# Patient Record
Sex: Female | Born: 1950 | Race: White | Hispanic: No | Marital: Married | Smoking: Former smoker
Health system: Southern US, Community
[De-identification: ages and names within clinical notes are randomized; demographics above are authoritative.]

## PROBLEM LIST (undated history)

## (undated) DIAGNOSIS — J189 Pneumonia, unspecified organism: Secondary | ICD-10-CM

## (undated) DIAGNOSIS — I10 Essential (primary) hypertension: Secondary | ICD-10-CM

## (undated) DIAGNOSIS — E162 Hypoglycemia, unspecified: Secondary | ICD-10-CM

## (undated) DIAGNOSIS — R519 Headache, unspecified: Secondary | ICD-10-CM

## (undated) DIAGNOSIS — K220 Achalasia of cardia: Secondary | ICD-10-CM

## (undated) DIAGNOSIS — Z8614 Personal history of Methicillin resistant Staphylococcus aureus infection: Secondary | ICD-10-CM

## (undated) DIAGNOSIS — N189 Chronic kidney disease, unspecified: Secondary | ICD-10-CM

## (undated) DIAGNOSIS — I209 Angina pectoris, unspecified: Secondary | ICD-10-CM

## (undated) DIAGNOSIS — M4726 Other spondylosis with radiculopathy, lumbar region: Secondary | ICD-10-CM

## (undated) DIAGNOSIS — D649 Anemia, unspecified: Secondary | ICD-10-CM

## (undated) DIAGNOSIS — E7800 Pure hypercholesterolemia, unspecified: Secondary | ICD-10-CM

## (undated) DIAGNOSIS — M5126 Other intervertebral disc displacement, lumbar region: Secondary | ICD-10-CM

## (undated) DIAGNOSIS — R51 Headache: Secondary | ICD-10-CM

## (undated) DIAGNOSIS — N814 Uterovaginal prolapse, unspecified: Secondary | ICD-10-CM

## (undated) HISTORY — PX: OTHER SURGICAL HISTORY: SHX169

## (undated) HISTORY — PX: TONSILLECTOMY: SUR1361

## (undated) HISTORY — PX: DILATION AND CURETTAGE OF UTERUS: SHX78

## (undated) HISTORY — PX: JOINT REPLACEMENT: SHX530

---
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
## 1998-11-06 ENCOUNTER — Other Ambulatory Visit: Admission: RE | Admit: 1998-11-06 | Discharge: 1998-11-06 | Payer: Self-pay | Admitting: Podiatry

## 1999-08-27 ENCOUNTER — Ambulatory Visit (HOSPITAL_COMMUNITY): Admission: RE | Admit: 1999-08-27 | Discharge: 1999-08-27 | Payer: Self-pay | Admitting: *Deleted

## 1999-08-27 ENCOUNTER — Encounter: Payer: Self-pay | Admitting: *Deleted

## 2001-12-22 ENCOUNTER — Ambulatory Visit (HOSPITAL_COMMUNITY): Admission: RE | Admit: 2001-12-22 | Discharge: 2001-12-23 | Payer: Self-pay | Admitting: Neurosurgery

## 2001-12-22 ENCOUNTER — Encounter: Payer: Self-pay | Admitting: Neurosurgery

## 2003-03-17 ENCOUNTER — Ambulatory Visit (HOSPITAL_COMMUNITY): Admission: RE | Admit: 2003-03-17 | Discharge: 2003-03-17 | Payer: Self-pay | Admitting: Family Medicine

## 2003-03-29 ENCOUNTER — Ambulatory Visit (HOSPITAL_COMMUNITY): Admission: RE | Admit: 2003-03-29 | Discharge: 2003-03-30 | Payer: Self-pay | Admitting: Neurosurgery

## 2007-03-08 ENCOUNTER — Ambulatory Visit (HOSPITAL_COMMUNITY): Admission: RE | Admit: 2007-03-08 | Discharge: 2007-03-08 | Payer: Self-pay | Admitting: Orthopedic Surgery

## 2007-03-14 ENCOUNTER — Inpatient Hospital Stay (HOSPITAL_COMMUNITY): Admission: RE | Admit: 2007-03-14 | Discharge: 2007-03-17 | Payer: Self-pay | Admitting: Orthopedic Surgery

## 2007-06-02 ENCOUNTER — Encounter: Admission: RE | Admit: 2007-06-02 | Discharge: 2007-06-02 | Payer: Self-pay | Admitting: Orthopedic Surgery

## 2008-09-12 ENCOUNTER — Ambulatory Visit: Payer: Self-pay | Admitting: Family Medicine

## 2010-09-08 NOTE — H&P (Signed)
Kendra Mcgrath, Kendra Mcgrath               ACCOUNT NO.:  [id number]   MEDICAL RECORD NO.:  [id number]          PATIENT TYPE:  INP   LOCATION:  NA                           FACILITY:  [facility]   PHYSICIAN:  Madlyn Frankel. [name], M.D.  DATE OF BIRTH:  December 05, 1950   DATE OF ADMISSION:  03/14/2007  DATE OF DISCHARGE:                              HISTORY & PHYSICAL   PROCEDURE:  Conversion left total hip arthroplasty.   CHIEF COMPLAINT:  Left hip pain.   HISTORY OF PRESENT ILLNESS:  A 60 year old female with a history of left  femur fracture with an open reduction internal fixation with subsequent  nonunion.  Pain has been intractable.  Presurgically assessed by [facility].   PAST SURGICAL HISTORY:  Back surgery on L3, L4 and L5 in 2005 and 2006.   FAMILY HISTORY:  Cancer and arthritis.   SOCIAL HISTORY:  Married.  Primary caregiver will be spouse after  surgery.   ALLERGIES:  NO KNOWN DRUG ALLERGIES.   MEDICATIONS:  Oxycodone 5 mg one to three p.o. q.4-6 h. p.r.n. pain.   REVIEW OF SYSTEMS:  PULMONARY:  Pneumonia in August 2008.  ENDOCRINE:  Hypoglycemic events, diet-controlled.  Otherwise, see HPI.   PHYSICAL EXAMINATION:  VITAL SIGNS:  Pulse 64, respirations 18, blood  pressure 138/84.  GENERAL:  She is awake, alert and oriented, well-developed, well-  nourished, in no acute distress.  NECK:  Supple.  No carotid bruits.  CHEST/LUNGS:  Clear to auscultation bilaterally.  BREASTS:  Deferred.  HEART:  Regular rate and rhythm without gallops, clicks, rubs or  murmurs.  ABDOMEN:  Soft, nontender, nondistended.  Bowel sounds present.  GENITOURINARY:  Deferred.  EXTREMITIES:  Left lower extremity has increased pain with internal  range of motion.  SKIN:  Cellulitis with wound incision.  NEUROLOGIC:  Intact distal sensibilities.   LABORATORY DATA AND X-RAY FINDINGS:  Labs, EKG and chest x-ray pending.   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   PLAN:   Conversion left total hip arthroplasty at [facility] by  surgeon Dr. Durene Romans.  Risks and complications were discussed.  Postop medications including Lovenox, Robaxin, iron, aspirin, Colace,  MiraLax provided at time of history and physical.  Postoperative pain  medications will be provided at time of surgery.     ______________________________  Yetta Glassman [address], [place]      Madlyn Frankel. [name], M.D.  Electronically Signed    BLM/MEDQ  D:  03/13/2007  T:  03/14/2007  Job:  [id number]

## 2010-09-08 NOTE — Op Note (Signed)
Kendra Mcgrath, Kendra Mcgrath               ACCOUNT NO.:  [id number]   MEDICAL RECORD NO.:  [id number]          PATIENT TYPE:  INP   LOCATION:  1615                         FACILITY:  [facility]   PHYSICIAN:  Madlyn Frankel. [name], M.D.  DATE OF BIRTH:  02/19/1951   DATE OF PROCEDURE:  03/14/2007  DATE OF DISCHARGE:                               OPERATIVE REPORT   PREOPERATIVE DIAGNOSIS:  Failed left hip surgery with history of  subcapital femoral neck fracture, fixed with a two-hole DHS.   POSTOPERATIVE DIAGNOSIS:  Failed left hip surgery with history of  subcapital femoral neck fracture, fixed with a two-hole DHS.   OPERATION PERFORMED:  Conversion of previous left hip surgery to left  total hip replacement.   SURGEON:  Madlyn Frankel. [name], M.D.   COMPONENTS USED:  1. DePuy hip system with size 50 Pinnacle cup, a size 6 high offset      tri-lock stem with a 36 ceramic ball and a neutral metal liner.  2. Part of this procedure was to remove hardware including DHS screw      and two screw side plate.   ASSISTANT:  Madlyn Frankel. [name], M.D.   ASSISTANT:  Yetta Glassman. Mann, PA   ANESTHESIA:  General.   ESTIMATED BLOOD LOSS:  400 mL.   DRAINS:  None.   COMPLICATIONS:  None.   INDICATIONS FOR PROCEDURE:  Ms. Boak is a 60 year old female with a  history of six weeks ago having a fall in [place].  She sustained a  subcapital femoral neck fracture that was fixed with a two-hole side  plate DHS.  She was seen in follow up in my office approximately two  weeks postoperatively with persistent discomfort and inability to bear  weight.  Radiograph remained  inconclusive for any evidence of stability  and/or any obvious evidence of failure.  For another month she had  persistent pain.  After reviewing her options she wished to proceed with  hip replacement surgery due to the pain and the inability socially to  wait and she needs to return to normal existence.  Risks and benefits of  the surgery were  discussed including infection, DVT, component failure,  dislocation, need for revision surgery, fracture, removal of hardware.  Consent was obtained.   DESCRIPTION OF PROCEDURE:  Patient was brought to operative theater.  Once adequate anesthesia, preoperative antibiotics administered, patient  was positioned in right lateral decubitus position, left side up, left  hip was prescrubbed and prepped and draped in sterile fashion.  The  incision was made utilizing  the old incision, then carrying it  posterior to the trochanter for posterior approach to the hip.  The  iliotibial band and gluteus fascia was  developed.   I then incised the iliotibial band and gluteus fascia identifying the  vastus lateralis.  The vastus lateralis was opened posteriorly and  elevated anteriorly.  The screw and hole on the side plate were  identified.  At this point the two 4.5 screws were removed as was the  lag screw and  locking screw.  I then was able to identify  the locking  type DHS plate, I was able to back it out a little bit and twist the  screw out and utilized and the plate came out without issue.  I  irrigated out this lateral wound, debrided it of any soft tissue,  reapproximated vastus lateralis over to itself with a running 0 Vicryl.  The iliotibial band at this point was reapproximated partway.  Posterior  approach to the hip was now carried out splitting the gluteus  musculature.  The short external rotators were identified and taken down  separate from the posterior capsule.  An L capsulotomy was made  preserving the posterior capsule for later anatomical repair as well as  protection against the sciatic nerve.  The hip fracture was identified  with internal rotation was noted to have a nonunion.  I internally  rotated the hip and held it in position.  I then made a neck osteotomy  based on anatomic landmarks.  From an anatomic standpoint she was noted  to have a fairly steep femoral neck  with the head center above the  trochanter.   Following this neck cut, identified a fragment of the head.  I removed  the fragment in addition to removing the head itself in a corkscrew  I  identified further cartilaginous fragments, fairly large inside the  acetabulum, indicating the reason for persistent pain.   Attention was first directed to the femur.  I used a starting drill,  followed by the canal finder and then irrigated out the canal.  I began  broaching with size 1 initially carried it up to size 4 which sized the  level of my neck cut. I  did finish off  and planed the  cut out with  calcar planer.   Acetabular exposure was obtained and noted to have fairly shallow  acetabulum.  I reamed down to the medial wall and then reamed up to 49  reamer.  I then impacted a 50 at 35 to 40 degrees of abduction and 20  degrees of forward flexion.  Two cancellous screws were used  to support  this  fixation.  A neutral 36 metal trial liner was placed.   At this point I did trial reduction with a size 4 stem in place even  with high offset and this was too short.  There was no stability.  I  then went up to a size 6.  I did trial with a size 5 high offset but  than also felt that the leg lengths were more appropriate with  __________  ball, therefore I utilized the size 6 stem.  A size 6 broach  was impacted.  This sat about probably 5 mm proud of my neck cut.  I  then trialed again with a high offset neck.  I put a +5 ball.  At this  point I felt that hip stability was great, extremely stable with hip  flexion and internal rotation to 80 to 90 degrees, stable in sleep  position, 1 mm shuck in external rotation and extension.  No evidence of  impingement.  Given this, all the components were removed and the trial  components were removed.  Final manhole cover was placed on acetabulum,  final 36 metal liner impacted.  Final 6 high offset trial stem was  impacted.  The level of the  bridge, given this I chose a 36 + 5 high  offset ceramic ball and impacted it on a clean and dry trunnion.  The  hip was reduced.  Hip was irrigated throughout the case and again at  this point. I  reapproximated posterior capsule using #1 Ethibond.  Remaining wound was closed in layers to reapproximate the iliotibial  band with #1 Vicryl and the gluteus fascia with #1 Vicryl running.  The  remaining wound was closed with 4-0 Vicryl and running 4-0 Monocryl for  the entire span of the wound. The  wound was cleaned, dried and dressed  sterilely with Steri-Strips and Mepilex dressing.  She was brought to  the recovery room extubated in stable condition.      Madlyn Frankel [name], M.D.  Electronically Signed    MDO/MEDQ  D:  03/14/2007  T:  03/15/2007  Job:  [id number]

## 2010-09-11 NOTE — Op Note (Signed)
[name], CAKE                           ACCOUNT NO.:  [id number]   MEDICAL RECORD NO.:  [id number]                   PATIENT TYPE:  OIB   LOCATION:  NA                                   FACILITY:  [facility]   PHYSICIAN:  Kyle L. [name], M.D.               DATE OF BIRTH:  1951/01/19   DATE OF PROCEDURE:  12/22/2001  DATE OF DISCHARGE:                                 OPERATIVE REPORT   PREOPERATIVE DIAGNOSES:  1. Displaced disk, L4-L5, right.  2. Right L5 and right L4 radiculopathy.  3. Degenerative disk disease, L4-L5.   POSTOPERATIVE DIAGNOSES:  1. Displaced disk, L4-L5, right.  2. Right L5 and right L4 radiculopathy.  3. Degenerative disk disease, L4-L5.   PROCEDURE:  Right L4 semi-hemilaminectomy with diskectomy and  microdissection.   COMPLICATIONS:  None.   ANESTHESIA:  General endotracheal.   SURGEON:  Kyle L. [name], M.D.   ASSISTANT:  Hewitt Shorts, M.D.   INDICATIONS:  Kendra Mcgrath is a 60 year old woman who presents with  weakness in the right lower right lower extremity at the quadriceps,  dorsiflexors, and some also in the plantar flexors.  She has pain and  numbness in her right lower extremity, all consistent with the L4 and L5  radiculopathy.  An MRI showed an extremely large disk herniation at L4-L5  with free fragment extending roughly behind the body of L4.  I recommended  and she has agreed to undergo operative decompressive.   OPERATIVE NOTE:  Kendra Mcgrath was brought to the operating room, intubated and  placed under general anesthesia without difficulty.  She was rolled prone on  to the Wilson frame assuring all pressure points were properly padded.  Her  back was prepped and then she was draped in a sterile fashion.  Preoperative  localization consisted of me placing my local anesthetic.  After that was  done I then opened the incision with a #10 blade and took this down to the  thoracolumbar fascia.  I opened the thoracolumbar fascia  sharply and exposed  the lamina at what I believed to be the L4.  I took another x-ray and I was  in the correct interlaminar space.   I then performed a semi-hemilaminectomy of L4 after placing a self-retaining  retractor.  I removed the ligamentum flavum and exposed the thecal sac  between the L4 and L5 lamina.  I retracted the thecal sac medially and  encountered what was an extremely large piece of disk.  I then was able to  remove, with Dr. Earl Gala assistance, the disk using microscopic  dissection.  A number of large fragments were removed.  After that was done  I then opened the disk space and removed more disk from that area using the  curets and pituitary rongeurs.  After I felt that  there was adequate decompression, I inspected the nerve roots and all were  free of any compression.  I then irrigated.  I then closed the wound in a  layered fashion using Vicryl sutures with Dr. Earl Gala assistance.  Dermabond was used for a sterile dressing.  The patient tolerated the  procedure after being extubated.                                               Kyle L. [name], M.D.    [name]  D:  12/23/2001  T:  12/26/2001  Job:  [id number]

## 2010-09-11 NOTE — H&P (Signed)
Kendra Mcgrath, Kendra Mcgrath                           ACCOUNT NO.:  [id number]   MEDICAL RECORD NO.:  [id number]                   PATIENT TYPE:  OIB   LOCATION:  NA                                   FACILITY:  [facility]   PHYSICIAN:  Kyle L. [name], M.D.               DATE OF BIRTH:  06-Sep-1950   DATE OF ADMISSION:  DATE OF DISCHARGE:                                HISTORY & PHYSICAL   ADMITTING DIAGNOSES:  1. Displaced disk right L4-5.  2. Lumbar radiculopathy.  3. Degenerative disk disease.   INDICATIONS:  The patient presented to my office 12/21/2001 for evaluation  of a very large disk herniation at L4-5 on the right side.  She has numbness  in the right lower extremity and weakness in the dorsiflexors, plantar  flexors, and in the right thigh.  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  She is right handed.  On August 10, she felt  an immediate pop in her back when she was bending over, and this was  associated with numbness in the right leg and severe leg cramps.  She does  have some tingling in the right lower extremity.  She has had no previous  operations.  She did have a disk problem in the past, but she was able to  deal with that conservatively.  She also has had a problem in her neck.   ALLERGIES:  No known drug allergies   SOCIAL HISTORY:  She has smoked one pack of cigarettes a day since she was  15.  She does not use alcohol or illicit drugs.  She is 5 feet 9 inches tall  and weighs 150 pounds.   REVIEW OF SYSTEMS:  Positive for night sweats, leg pain with walking, leg  weakness, back pain, leg pain, and neck pain.  She denies ear, nose, throat,  mouth, respiratory, gastrointestinal, genitourinary, skin, neurological,  psychiatric, endocrine, hematologic, and allergic problems.   CURRENT MEDICATIONS:  Includes Prempro. She says she took two pain pills  this  morning, but she did not list them on her intake sheet.   PHYSICAL EXAMINATION:  VITAL SIGNS:  Pulse 84.  GENERAL:  She is alert and oriented x4 and answers all questions  appropriately.  She is in mild distress.  NEUROLOGIC:  She has great difficulty with heel walking on the right, mild  difficulty with toe walking on the right.  She has weak quadriceps on the  right.  She has decreased sensation in an L4 distribution on the right side.  She has no deficits on the left.  She has intact proprioception.  Muscle  tone, bulk, and coordination are normal.  Deep tendon reflexes are 2+ at the  ankles, left knee, and 1+ at the right knee.  The reflex on the right side,  however, is significantly depressed compared to the left.  There are no  cervical masses or bruits.  She has full strength in the upper extremities.  HEENT:  Pupils are equal, round, and reactive to light.  She has full  extraocular movements.  Tongue and uvula are in the midline.  Shoulder shrub  is normal.  She has symmetric facial sensation and movements.  Hearing is  intact to voice bilaterally.  LUNGS:  Clear.  HEART:  Regular rhythm and rate.  No murmurs or rubs.   DIAGNOSTIC DATA:  MRI performed 12/14/2001 shows a very large herniated disk  with a large free fragment traveling __________  behind the body of L4.  The  disk rupture is at L4-5.  That disk is degenerated.  The disk rupture is  sizable enough that it catches both the right an left nerve roots at the  disk space.  Free fragment is on the right side.  The spine is normal  otherwise.  There are no abnormalities in the __________ .  The cauda equina  for what is seen is normal.   SUMMARY:  The patient has a large disk herniation at L4-5 and is in severe  pain and has motor deficits.  I think she will achieve her best results with  an operative decompression.  I do not see how conservative treatment or  other nonoperative modalities would be of help.  She  understands that, while  this is no a lifesaving procedure, my strong recommendation is that she  undergo the procedure.  Risks and benefits including disk recurrence, need  for further surgery, weakness in the leg with numbness, bowel or bladder  dysfunction, CSF leak were discussed.  She understands and wishes to  proceed.  She is admitted for an L4-5 semi hemilaminectomy and diskectomy.                                                 Kyle L. [name], M.D.    [name]  D:  12/22/2001  T:  12/25/2001  Job:  [id number]

## 2010-09-11 NOTE — Op Note (Signed)
Kendra Mcgrath, KEPPLE                           ACCOUNT NO.:  [id number]   MEDICAL RECORD NO.:  [id number]                   PATIENT TYPE:  OIB   LOCATION:  3010                                 FACILITY:  [facility]   PHYSICIAN:  Coletta Memos, M.D.                  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   POSTOPERATIVE DIAGNOSIS:  Recurrent disk herniation L4-5 midline.   PROCEDURE:  Redo diskectomy L4-5 with microdissection.   COMPLICATIONS:  None.   SURGEON:  Coletta Memos, M.D.   ANESTHESIA:  General endotracheal anesthesia.   INDICATIONS FOR PROCEDURE:  [name] is a patient of mine who  underwent  a lumbar laminectomy and diskectomy for what was a large disk  herniation at L4-5 approximately 1 year ago. She did quite well afterward  until getting entangled with her dog this summer. She took a fall in August  and has since has very severe pain. Being stoic she lived with it until it  just became unbearable which was within the last few weeks. An MRI showed a  large recurrent disk  herniation. I therefore recommended and she agreed to  undergo redo diskectomy.   DESCRIPTION OF PROCEDURE:  [name] was brought to the operating room,  intubated and placed under general anesthesia without difficulty. She was  rolled onto the Wilson frame and all pressure points were properly padded.  Her back was prepped and she was draped in a sterile fashion.   Using the old incision as a guide, I opened that with a #10 blade and took  this down to the thoracolumbar fascia sharply. I then exposed the lamina of  L4 and L5 in a subperiosteal fashion. I took an x-ray to confirm I was in  the correct intralaminar space.   Once that was done, I then proceeded to use a curet to remove the scar from  the bony edges and also   to dissect the dura from the bony edges. I was then  able to retract the dura medially. I brought the microscope into the  operative field and with microdissection continued  with the case. I was  able to retract the  thecal sac and develop the plane between  it and the  disk  herniation.   I was able to then enter the disk space and remove a large amount of disk  material and endplates to decompress the  thecal sac. There was some  scarring left underneath the  thecal sac, but I certainly did not think it  was worth while trying to remove that for fear of a CSF leak. The sac and  the nerve root were well decompressed on the right side.   I therefore  irrigated the wound. I then closed the wound  in a layered  fashion. Vicryl was used  for the subcutaneous tissue for the fascial  closure and then  the subcuticular tissue. The nerve root which was well  decompressed was the L5 root on the right side, which was the side I was  operating on.   The patient tolerated the procedure well. Dermabond was used for a sterile  dressing.                                               Coletta Memos, M.D.    KC/MEDQ  D:  03/29/2003  T:  03/30/2003  Job:  [id number]

## 2010-09-11 NOTE — Discharge Summary (Signed)
Kendra Mcgrath, Kendra Mcgrath               ACCOUNT NO.:  [id number]   MEDICAL RECORD NO.:  [id number]          PATIENT TYPE:  INP   LOCATION:  1615                         FACILITY:  [facility]   PHYSICIAN:  Madlyn Frankel. [name], M.D.  DATE OF BIRTH:  1950-10-08   DATE OF ADMISSION:  03/14/2007  DATE OF DISCHARGE:  03/17/2007                               DISCHARGE SUMMARY   ADMITTING DIAGNOSIS:  Nonunion of fracture.   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   CONSULTATION:  None.   PROCEDURE:  Conversion left total hip arthroplasty.  Surgeon:  Dr.  Durene Romans.  Assistant:  Dwyane Luo, PA.  Components were a metal on  ceramic.   BRIEF HISTORY OF PRESENT ILLNESS:  Kendra Mcgrath is a 60 year old female  with a history of left femur fracture with an open reduction internal  fixation with subsequent nonunion.  Pain has been intractable.  Presurgically assessed by [facility].   CARDIOLOGY:  EKG normal sinus rhythm.   RADIOLOGY:  Portable pelvis:  Left total hip arthroplasty without  evidence of complications.  Chest two-view, impression:  No acute  cardiopulmonary abnormality.  There is a 4-mm pulmonary nodule in the  right upper lobe; 47-month followup two-view chest series is recommended  to evaluate stability.   LABORATORIES:  CBC:  Postoperative day #1 her hematocrit dropped down to  23.5.  Her SODIUM was 133, her potassium was 3.3.  She was transfused 2  units of packed red blood cells.  Her hematocrit came up to 30.9 at time  of discharge.  Her sodium was 131, potassium 3.4, glucose 168,  creatinine 0.9 prior to discharge.  Coumadin was started.  Her INR by  discharge was 2.1.   HOSPITAL COURSE:  The patient underwent left total hip replacement,  tolerated the procedure well, was admitted to the orthopedic floor.  She  was very emotional at first, was given some antianxiety medicine which  helped with her agitation.  We changed her pain medicines,  switched her  over to OxyContin plus oxycodone.  This helped with her pain management.  She remained afebrile throughout.  She remained neurovascularly intact  of her left lower extremity throughout.  Dressing was changed on a daily  basis after postoperative day #1 with no significant drainage.  She  progressed nicely, was able to ambulate at least 50 feet, weightbearing  as tolerated with the use of a rolling walker prior to discharge.   DISCHARGE DISPOSITION:  Discharged home with home health care PT and  Coumadin measurement.   DISCHARGE DIET:  Regular.   DISCHARGE WOUND CARE:  Keep wound dry.   DISCHARGE MEDICATIONS:  1. Coumadin 4 mg daily.  2. Robaxin 500 mg p.o. q.6h.  3. OxyContin 20 mg one p.o. q.12h. x10 days.  4. Oxycodone 5 mg one to three p.o. q.4-6h. p.r.n. pain.  5. Iron 325 mg one p.o. t.i.d. x3 weeks.  6. Colace 100 mg p.o. b.i.d.  7. MiraLax 17 g p.o. daily.   DISCHARGE FOLLOWUP:  1. Follow up with Dr. [name] at phone number  [phone] in 10-14 days.  2. Weightbearing as tolerated with use of rolling walker.  3. TED hose on 12/off 12.     ______________________________  Yetta Glassman. [address], [place]      Madlyn Frankel. [name], M.D.  Electronically Signed    BLM/MEDQ  D:  04/10/2007  T:  04/11/2007  Job:  [id number]

## 2011-02-02 LAB — PROTIME-INR
INR: 1.6 — ABNORMAL HIGH
INR: 2.1 — ABNORMAL HIGH
Prothrombin Time: 24.4 — ABNORMAL HIGH

## 2011-02-02 LAB — TYPE AND SCREEN
ABO/RH(D): O POS
ABO/RH(D): O POS
Antibody Screen: NEGATIVE

## 2011-02-02 LAB — CBC
HCT: 23.5 — ABNORMAL LOW
HCT: 30.7 — ABNORMAL LOW
HCT: 32.8 — ABNORMAL LOW
Hemoglobin: 10.6 — ABNORMAL LOW
Hemoglobin: 8 — ABNORMAL LOW
Hemoglobin: 10.9 — ABNORMAL LOW
MCHC: 34.5
MCHC: 33.3
MCV: 85.5
Platelets: 206
Platelets: 230
RDW: 13.9
RDW: 14.1
WBC: 6.2

## 2011-02-02 LAB — BASIC METABOLIC PANEL
BUN: 3 — ABNORMAL LOW
BUN: 7
CO2: 29
Chloride: 99
Creatinine, Ser: 0.69
GFR calc Af Amer: >60

## 2011-02-02 LAB — URINE CULTURE: Culture: NO GROWTH

## 2011-02-02 LAB — URINALYSIS, ROUTINE W REFLEX MICROSCOPIC
Hgb urine dipstick: NEGATIVE
Ketones, ur: NEGATIVE
Specific Gravity, Urine: 1.016

## 2011-02-02 LAB — PREPARE RBC (CROSSMATCH)

## 2011-02-02 LAB — ABO/RH: ABO/RH(D): O POS

## 2014-08-01 DIAGNOSIS — Z471 Aftercare following joint replacement surgery: Secondary | ICD-10-CM | POA: Diagnosis not present

## 2014-08-01 DIAGNOSIS — Z96642 Presence of left artificial hip joint: Secondary | ICD-10-CM | POA: Diagnosis not present

## 2014-08-02 DIAGNOSIS — Z96642 Presence of left artificial hip joint: Secondary | ICD-10-CM | POA: Diagnosis not present

## 2014-08-08 DIAGNOSIS — M25552 Pain in left hip: Secondary | ICD-10-CM | POA: Diagnosis not present

## 2014-08-13 DIAGNOSIS — Z720 Tobacco use: Secondary | ICD-10-CM | POA: Diagnosis not present

## 2014-08-13 DIAGNOSIS — L309 Dermatitis, unspecified: Secondary | ICD-10-CM | POA: Diagnosis not present

## 2014-08-13 DIAGNOSIS — Z1389 Encounter for screening for other disorder: Secondary | ICD-10-CM | POA: Diagnosis not present

## 2014-08-13 DIAGNOSIS — M25552 Pain in left hip: Secondary | ICD-10-CM | POA: Diagnosis not present

## 2014-08-13 DIAGNOSIS — I10 Essential (primary) hypertension: Secondary | ICD-10-CM | POA: Diagnosis not present

## 2014-08-26 NOTE — Patient Instructions (Addendum)
Kendra Harrisamela Mcgrath  08/26/2014   Your procedure is scheduled on: 09/09/2014    Report to [facility] Main  Entrance and follow signs to               Short Stay Center at       0530 AM.  Call this number if you have problems the morning of surgery [phone]   Remember:  Do not eat food or drink liquids :After Midnight.     Take these medicines the morning of surgery with A SIP OF WATER: Toprol                                You may not have any metal on your body including hair pins and              piercings  Do not wear jewelry, make-up, lotions, powders or perfumes., deodorant.               Do not wear nail polish.  Do not shave  48 hours prior to surgery.     Do not bring valuables to the hospital. [facility] IS NOT             RESPONSIBLE   FOR VALUABLES.  Contacts, dentures or bridgework may not be worn into surgery.  Leave suitcase in the car. After surgery it may be brought to your room.        Special Instructions: coughing and deep breathing exercises, leg exercises              Please read over the following fact sheets you were given: _____________________________________________________________________             [facility] - Preparing for Surgery Before surgery, you can play an important role.  Because skin is not sterile, your skin needs to be as free of germs as possible.  You can reduce the number of germs on your skin by washing with CHG (chlorahexidine gluconate) soap before surgery.  CHG is an antiseptic cleaner which kills germs and bonds with the skin to continue killing germs even after washing. Please DO NOT use if you have an allergy to CHG or antibacterial soaps.  If your skin becomes reddened/irritated stop using the CHG and inform your nurse when you arrive at Short Stay. Do not shave (including legs and underarms) for at least 48 hours prior to the first CHG shower.  You may shave your face/neck. Please follow these  instructions carefully:  1.  Shower with CHG Soap the night before surgery and the  morning of Surgery.  2.  If you choose to wash your hair, wash your hair first as usual with your  normal  shampoo.  3.  After you shampoo, rinse your hair and body thoroughly to remove the  shampoo.                           4.  Use CHG as you would any other liquid soap.  You can apply chg directly  to the skin and wash                       Gently with a scrungie or clean washcloth.  5.  Apply the CHG Soap to your body ONLY FROM THE NECK  DOWN.   Do not use on face/ open                           Wound or open sores. Avoid contact with eyes, ears mouth and genitals (private parts).                       Wash face,  Genitals (private parts) with your normal soap.             6.  Wash thoroughly, paying special attention to the area where your surgery  will be performed.  7.  Thoroughly rinse your body with warm water from the neck down.  8.  DO NOT shower/wash with your normal soap after using and rinsing off  the CHG Soap.                9.  Pat yourself dry with a clean towel.            10.  Wear clean pajamas.            11.  Place clean sheets on your bed the night of your first shower and do not  sleep with pets. Day of Surgery : Do not apply any lotions/deodorants the morning of surgery.  Please wear clean clothes to the hospital/surgery center.  FAILURE TO FOLLOW THESE INSTRUCTIONS MAY RESULT IN THE CANCELLATION OF YOUR SURGERY PATIENT SIGNATURE_________________________________  NURSE SIGNATURE__________________________________  ________________________________________________________________________  WHAT IS A BLOOD TRANSFUSION? Blood Transfusion Information  A transfusion is the replacement of blood or some of its parts. Blood is made up of multiple cells which provide different functions.  Red blood cells carry oxygen and are used for blood loss replacement.  White blood cells fight against  infection.  Platelets control bleeding.  Plasma helps clot blood.  Other blood products are available for specialized needs, such as hemophilia or other clotting disorders. BEFORE THE TRANSFUSION  Who gives blood for transfusions?   Healthy volunteers who are fully evaluated to make sure their blood is safe. This is blood bank blood. Transfusion therapy is the safest it has ever been in the practice of medicine. Before blood is taken from a donor, a complete history is taken to make sure that person has no history of diseases nor engages in risky social behavior (examples are intravenous drug use or sexual activity with multiple partners). The donor's travel history is screened to minimize risk of transmitting infections, such as malaria. The donated blood is tested for signs of infectious diseases, such as HIV and hepatitis. The blood is then tested to be sure it is compatible with you in order to minimize the chance of a transfusion reaction. If you or a relative donates blood, this is often done in anticipation of surgery and is not appropriate for emergency situations. It takes many days to process the donated blood. RISKS AND COMPLICATIONS Although transfusion therapy is very safe and saves many lives, the main dangers of transfusion include:  1. Getting an infectious disease. 2. Developing a transfusion reaction. This is an allergic reaction to something in the blood you were given. Every precaution is taken to prevent this. The decision to have a blood transfusion has been considered carefully by your caregiver before blood is given. Blood is not given unless the benefits outweigh the risks. AFTER THE TRANSFUSION  Right after receiving a blood transfusion, you will usually feel much better and more energetic.  This is especially true if your red blood cells have gotten low (anemic). The transfusion raises the level of the red blood cells which carry oxygen, and this usually causes an energy  increase.  The nurse administering the transfusion will monitor you carefully for complications. HOME CARE INSTRUCTIONS  No special instructions are needed after a transfusion. You may find your energy is better. Speak with your caregiver about any limitations on activity for underlying diseases you may have. SEEK MEDICAL CARE IF:   Your condition is not improving after your transfusion.  You develop redness or irritation at the intravenous (IV) site. SEEK IMMEDIATE MEDICAL CARE IF:  Any of the following symptoms occur over the next 12 hours:  Shaking chills.  You have a temperature by mouth above 102 F (38.9 C), not controlled by medicine.  Chest, back, or muscle pain.  People around you feel you are not acting correctly or are confused.  Shortness of breath or difficulty breathing.  Dizziness and fainting.  You get a rash or develop hives.  You have a decrease in urine output.  Your urine turns a dark color or changes to pink, red, or brown. Any of the following symptoms occur over the next 10 days:  You have a temperature by mouth above 102 F (38.9 C), not controlled by medicine.  Shortness of breath.  Weakness after normal activity.  The white part of the eye turns yellow (jaundice).  You have a decrease in the amount of urine or are urinating less often.  Your urine turns a dark color or changes to pink, red, or brown. Document Released: 04/09/2000 Document Revised: 07/05/2011 Document Reviewed: 11/27/2007 ExitCare Patient Information 2014 [place], [place].  _______________________________________________________________________  Incentive Spirometer  An incentive spirometer is a tool that can help keep your lungs clear and active. This tool measures how well you are filling your lungs with each breath. Taking long deep breaths may help reverse or decrease the chance of developing breathing (pulmonary) problems (especially infection) following:  A long  period of time when you are unable to move or be active. BEFORE THE PROCEDURE   If the spirometer includes an indicator to show your best effort, your nurse or respiratory therapist will set it to a desired goal.  If possible, sit up straight or lean slightly forward. Try not to slouch.  Hold the incentive spirometer in an upright position. INSTRUCTIONS FOR USE  3. Sit on the edge of your bed if possible, or sit up as far as you can in bed or on a chair. 4. Hold the incentive spirometer in an upright position. 5. Breathe out normally. 6. Place the mouthpiece in your mouth and seal your lips tightly around it. 7. Breathe in slowly and as deeply as possible, raising the piston or the ball toward the top of the column. 8. Hold your breath for 3-5 seconds or for as long as possible. Allow the piston or ball to fall to the bottom of the column. 9. Remove the mouthpiece from your mouth and breathe out normally. 10. Rest for a few seconds and repeat Steps 1 through 7 at least 10 times every 1-2 hours when you are awake. Take your time and take a few normal breaths between deep breaths. 11. The spirometer may include an indicator to show your best effort. Use the indicator as a goal to work toward during each repetition. 12. After each set of 10 deep breaths, practice coughing to be sure your lungs are clear.  If you have an incision (the cut made at the time of surgery), support your incision when coughing by placing a pillow or rolled up towels firmly against it. Once you are able to get out of bed, walk around indoors and cough well. You may stop using the incentive spirometer when instructed by your caregiver.  RISKS AND COMPLICATIONS  Take your time so you do not get dizzy or light-headed.  If you are in pain, you may need to take or ask for pain medication before doing incentive spirometry. It is harder to take a deep breath if you are having pain. AFTER USE  Rest and breathe slowly and  easily.  It can be helpful to keep track of a log of your progress. Your caregiver can provide you with a simple table to help with this. If you are using the spirometer at home, follow these instructions: [place] IF:   You are having difficultly using the spirometer.  You have trouble using the spirometer as often as instructed.  Your pain medication is not giving enough relief while using the spirometer.  You develop fever of 100.5 F (38.1 C) or higher. SEEK IMMEDIATE MEDICAL CARE IF:   You cough up bloody sputum that had not been present before.  You develop fever of 102 F (38.9 C) or greater.  You develop worsening pain at or near the incision site. MAKE SURE YOU:   Understand these instructions.  Will watch your condition.  Will get help right away if you are not doing well or get worse. Document Released: 08/23/2006 Document Revised: 07/05/2011 Document Reviewed: 10/24/2006 [facility] Patient Information 2014 [place], [place].   ________________________________________________________________________

## 2014-08-26 NOTE — Progress Notes (Signed)
Consent is incorrect- please see consent and make correction.  Thank You.

## 2014-08-27 ENCOUNTER — Encounter (HOSPITAL_COMMUNITY)
Admission: RE | Admit: 2014-08-27 | Discharge: 2014-08-27 | Disposition: A | Discharge status: Home / Self Care | Payer: Medicare Other | Source: Ambulatory Visit | Attending: Orthopedic Surgery | Admitting: Orthopedic Surgery

## 2014-08-27 ENCOUNTER — Encounter (HOSPITAL_COMMUNITY): Payer: Self-pay

## 2014-08-27 DIAGNOSIS — Z01812 Encounter for preprocedural laboratory examination: Secondary | ICD-10-CM | POA: Insufficient documentation

## 2014-08-27 DIAGNOSIS — Z01810 Encounter for preprocedural cardiovascular examination: Secondary | ICD-10-CM | POA: Diagnosis not present

## 2014-08-27 HISTORY — DX: Pneumonia, unspecified organism: J18.9

## 2014-08-27 HISTORY — DX: Anemia, unspecified: D64.9

## 2014-08-27 HISTORY — DX: Essential (primary) hypertension: I10

## 2014-08-27 HISTORY — DX: Angina pectoris, unspecified: I20.9

## 2014-08-27 HISTORY — DX: Personal history of Methicillin resistant Staphylococcus aureus infection: Z86.14

## 2014-08-27 HISTORY — DX: Headache: R51

## 2014-08-27 HISTORY — DX: Headache, unspecified: R51.9

## 2014-08-27 HISTORY — DX: Hypoglycemia, unspecified: E16.2

## 2014-08-27 LAB — URINALYSIS, ROUTINE W REFLEX MICROSCOPIC
Bilirubin Urine: NEGATIVE
Glucose, UA: NEGATIVE mg/dL
Hgb urine dipstick: NEGATIVE
Ketones, ur: NEGATIVE mg/dL
NITRITE: POSITIVE — AB
PROTEIN: NEGATIVE mg/dL
Specific Gravity, Urine: 1.021 (ref 1.005–1.030)
UROBILINOGEN UA: 0.2 mg/dL (ref 0.0–1.0)
pH: 6 (ref 5.0–8.0)

## 2014-08-27 LAB — PROTIME-INR
INR: 0.97 (ref 0.00–1.49)
Prothrombin Time: 13 seconds (ref 11.6–15.2)

## 2014-08-27 LAB — BASIC METABOLIC PANEL
ANION GAP: 11 (ref 5–15)
BUN: 22 mg/dL — ABNORMAL HIGH (ref 6–20)
CO2: 28 mmol/L (ref 22–32)
Calcium: 9.5 mg/dL (ref 8.9–10.3)
Chloride: 102 mmol/L (ref 101–111)
Creatinine, Ser: 0.88 mg/dL (ref 0.44–1.00)
GFR calc Af Amer: >60 mL/min (ref >60)
Glucose, Bld: 83 mg/dL (ref 70–99)
POTASSIUM: 4.3 mmol/L (ref 3.5–5.1)
SODIUM: 141 mmol/L (ref 135–145)

## 2014-08-27 LAB — URINE MICROSCOPIC-ADD ON

## 2014-08-27 LAB — CBC
HCT: 43.5 % (ref 36.0–46.0)
Hemoglobin: 14.1 g/dL (ref 12.0–15.0)
MCH: 27.4 pg (ref 26.0–34.0)
MCHC: 32.4 g/dL (ref 30.0–36.0)
MCV: 84.6 fL (ref 78.0–100.0)
Platelets: 255 10*3/uL (ref 150–400)
RBC: 5.14 MIL/uL — ABNORMAL HIGH (ref 3.87–5.11)
RDW: 13.9 % (ref 11.5–15.5)
WBC: 6.6 10*3/uL (ref 4.0–10.5)

## 2014-08-27 LAB — APTT: APTT: 32 s (ref 24–37)

## 2014-08-27 LAB — SURGICAL PCR SCREEN
MRSA, PCR: NEGATIVE
Staphylococcus aureus: POSITIVE — AB

## 2014-08-27 NOTE — Progress Notes (Signed)
Patient reports having chest pain off and on for years at time of preop appointment. Hx of hypertension. Clearance on chart- from Dr Tally Joeavid Swayne- 08/13/14 along with office visit note from 08/13/14.  Office visit note from Dr Azucena CecilSwayne states patient denies chest pain or shortness of breath at appt on 08/13/14.  EKG done at preop appointment.  Waiting for confirmed EKG results. Unconfirmed EKG along with medical history discussed with Dr Drexel Ivey GroutPeter Carignan ( anesthesia) .  Dr Acey Lavarignan stated patient needs cardiac workup prior to surgery.  Called and left Rosalva Ferr[facility]Sherry [facility]ills at [facility] a message regarding this.

## 2014-08-27 NOTE — Progress Notes (Signed)
DR Azucena CecilSwayne- 08/13/14 clearance on chart along with LOV note from Dr Azucena CecilSwayne on chart.

## 2014-08-27 NOTE — Progress Notes (Signed)
U/A and micro results along with BMP done 08/27/2014 faxed via EPIC to Dr Charlann Boxerlin.

## 2014-08-28 NOTE — Progress Notes (Addendum)
Left aother message for Rosalva FerronSherry Wills regarding need of cardiac clearance Also faxed final EKG [facility] to [phone].

## 2014-08-28 NOTE — Progress Notes (Signed)
Rosalva FerronSherry Wills ( [facility]) called back and stated she has received EKG done 08/27/2014 by fax and is aware of need for cardiology workup prior to surgery on 09/09/14.  States she will call patient and arrange.

## 2014-08-28 NOTE — Progress Notes (Signed)
Final EKG in EPIC done 08/27/14.

## 2014-08-29 DIAGNOSIS — Z01810 Encounter for preprocedural cardiovascular examination: Secondary | ICD-10-CM | POA: Diagnosis not present

## 2014-08-29 DIAGNOSIS — I10 Essential (primary) hypertension: Secondary | ICD-10-CM | POA: Diagnosis not present

## 2014-08-29 DIAGNOSIS — R079 Chest pain, unspecified: Secondary | ICD-10-CM | POA: Diagnosis not present

## 2014-08-29 DIAGNOSIS — Z720 Tobacco use: Secondary | ICD-10-CM | POA: Diagnosis not present

## 2014-08-30 DIAGNOSIS — R0789 Other chest pain: Secondary | ICD-10-CM | POA: Diagnosis not present

## 2014-08-30 DIAGNOSIS — I10 Essential (primary) hypertension: Secondary | ICD-10-CM | POA: Diagnosis not present

## 2014-08-30 DIAGNOSIS — Z01810 Encounter for preprocedural cardiovascular examination: Secondary | ICD-10-CM | POA: Diagnosis not present

## 2014-09-05 NOTE — H&P (Signed)
TOTAL HIP REVISION ADMISSION H&P  Patient is admitted for left revision total hip arthroplasty.  Subjective:  Chief Complaint:    Left hip pain S/P THA  HPI: Kendra Mcgrath, 64 y.o. 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  The indications for the revision total hip arthroplasty are bearing surface wear leading to  symptomatic synovitis and implant or hip misalignment.  Onset of symptoms was abrupt starting ~8 years ago with gradually worsening course since that time.  Prior procedures on the left hip include arthroplasty per Dr. Charlann Boxerlin in November 2008.  Patient currently rates pain in the left hip at 10 out of 10 with activity.  There is night pain, worsening of pain with activity and weight bearing, trendelenberg gait, pain that interfers with activities of daily living, pain with passive range of motion and crepitus. Patient has evidence of previous THA by imaging studies.  This condition presents safety issues increasing the risk of falls.  There is no current active infection.  Risks, benefits and expectations were discussed with the patient.  Risks including but not limited to the risk of anesthesia, blood clots, nerve damage, blood vessel damage, failure of the prosthesis, infection and up to and including death.  Patient understand the risks, benefits and expectations and wishes to proceed with surgery.   PCP: Sissy HoffSWAYNE,DAVID W, MD  D/C Plans:      Home with HHPT  Post-op Meds:       No Rx given   Tranexamic Acid:      To be given - IV  Decadron:      Is to be given  FYI:     ASA post-op  Norco post-op   SAVE: components and all biological material   Past Medical History  Diagnosis Date  . Anginal pain     ongoing for last 4-5 years   . Hypertension   . Pneumonia     hx of pneumonia   . Hypoglycemia   . Headache   . Anemia     hx of   .  History of MRSA infection     2012 per patient on left ear     Past Surgical History  Procedure Laterality Date  . Tonsillectomy    . Joint replacement      left hip  . Left hip pinning       No prescriptions prior to admission   Allergies  Allergen Reactions  . Penicillins Hives    Itching and rash   . Tetanus Toxoids Hives    Itching and rash     History  Substance Use Topics  . Smoking status: Current Every Day Smoker -- 1.00 packs/day for 50 years    Types: Cigarettes  . Smokeless tobacco: Never Used  . Alcohol Use: Yes     Comment: rare    No family history on file.    Review of Systems  Constitutional: Negative.   Eyes: Negative.   Respiratory: Negative.   Cardiovascular: Negative.   Gastrointestinal: Negative.   Genitourinary: Negative.   Musculoskeletal: Positive for joint pain.  Skin: Negative.   Neurological: Positive for headaches.  Endo/Heme/Allergies: Negative.   Psychiatric/Behavioral: Negative.     Objective:  Physical Exam  Constitutional: She is oriented to person, place, and time. She appears well-developed and well-nourished.  HENT:  Head: Normocephalic and atraumatic.  Eyes: Pupils  are equal, round, and reactive to light.  Neck: Neck supple. No JVD present. No tracheal deviation present. No thyromegaly present.  Cardiovascular: Normal rate, regular rhythm, normal heart sounds and intact distal pulses.   Respiratory: Effort normal and breath sounds normal. No stridor. No respiratory distress. She has no wheezes.  GI: Soft. There is no tenderness. There is no guarding.  Musculoskeletal:       Left hip: She exhibits decreased range of motion, decreased strength, tenderness, bony tenderness, crepitus and laceration (healed previous incision).  Lymphadenopathy:    She has no cervical adenopathy.  Neurological: She is alert and oriented to person, place, and time. A sensory deficit (states she has right shin numbness) is present.  Skin: Skin  is warm and dry.     Imaging Review:  Plain radiographs demonstrate previous THA of the left hip(s). There is evidence of failed THA.  The bone quality appears to be good for age and reported activity level.  Assessment/Plan:  Left hip with failed previous arthroplasty.  The patient history, physical examination, clinical judgement of the provider and imaging studies are consistent with failure of  left hip, previous total hip arthroplasty. Revision total hip arthroplasty is deemed medically necessary. The treatment options including medical management, injection therapy, arthroscopy and arthroplasty were discussed at length. The risks and benefits of total hip arthroplasty were presented and reviewed. The risks due to aseptic loosening, infection, stiffness, dislocation/subluxation,  thromboembolic complications and other imponderables were discussed.  The patient acknowledged the explanation, agreed to proceed with the plan and consent was signed. Patient is being admitted for inpatient treatment for surgery, pain control, PT, OT, prophylactic antibiotics, VTE prophylaxis, progressive ambulation and ADL's and discharge planning. The patient is planning to be discharged home with home health services.     Anastasio AuerbachMatthew S. Tameron Lama   PA-C  09/05/2014, 8:15 AM

## 2014-09-06 NOTE — Progress Notes (Signed)
Called Rosalva FerronSherry Wills at office and she has spoke with attorney office this am and forwarded info to Dr Charlann Boxerlin and to their financial department per Rosalva FerronSherry Wills.

## 2014-09-06 NOTE — Progress Notes (Addendum)
Faxed to [employer] after speaking with her regarding the receipt of a " Spoilation Notice " from attorney office of [place], [place], and [place].  Confirmation received.  Kendra FerronSherry Wills stated she would forward to their attorneys.

## 2014-09-06 NOTE — Progress Notes (Signed)
Called office of Dr Jacinto HalimGanji after speaking with [facility] regarding clearance from cardiac MD on patient.  Received and placed on chart- Office visit note from Dr Jacinto HalimGanji- 08/29/2014 and Nuclear Stress Test from 08/30/2014 on chart.  Also faxed to Rosalva FerronSherry Wills at [facility].

## 2014-09-06 NOTE — Progress Notes (Signed)
Gerlene FeeMichelle Faison, Secretary of [employer] received call from attorney office of Libby MawMaglio, Cristal DeerChristopher and Elizebeth Brookingoale and I instructed Marcelino DusterMichelle to give them number of [facility] and the extension of Rosalva FerronSherry Wills at [phone].

## 2014-09-08 NOTE — Anesthesia Preprocedure Evaluation (Addendum)
Anesthesia Evaluation  Patient identified by MRN, date of birth, ID band Patient awake    Reviewed: Allergy & Precautions, H&P , NPO status , Patient's Chart, lab work & pertinent test results, reviewed documented beta blocker date and time   Airway Mallampati: II  TM Distance: >3 FB Neck ROM: full    Dental  (+) Edentulous Upper, Edentulous Lower, Dental Advisory Given   Pulmonary Current Smoker,  50 py smoker breath sounds clear to auscultation  Pulmonary exam normal       Cardiovascular hypertension, Pt. on home beta blockers + angina Normal cardiovascular examRhythm:regular Rate:Normal  ECG LAFB   Neuro/Psych negative neurological ROS  negative psych ROS   GI/Hepatic negative GI ROS, Neg liver ROS,   Endo/Other  negative endocrine ROS  Renal/GU negative Renal ROS  negative genitourinary   Musculoskeletal   Abdominal   Peds  Hematology negative hematology ROS (+)   Anesthesia Other Findings   Reproductive/Obstetrics negative OB ROS                            Anesthesia Physical Anesthesia Plan  ASA: III  Anesthesia Plan: General   Post-op Pain Management:    Induction: Intravenous  Airway Management Planned: Oral ETT  Additional Equipment:   Intra-op Plan:   Post-operative Plan: Extubation in OR  Informed Consent: I have reviewed the patients History and Physical, chart, labs and discussed the procedure including the risks, benefits and alternatives for the proposed anesthesia with the patient or authorized representative who has indicated his/her understanding and acceptance.   Dental Advisory Given  Plan Discussed with: CRNA and Surgeon  Anesthesia Plan Comments:        Anesthesia Quick Evaluation

## 2014-09-09 ENCOUNTER — Inpatient Hospital Stay (HOSPITAL_COMMUNITY): Payer: Medicare Other | Admitting: Anesthesiology

## 2014-09-09 ENCOUNTER — Inpatient Hospital Stay (HOSPITAL_COMMUNITY): Payer: Medicare Other

## 2014-09-09 ENCOUNTER — Encounter (HOSPITAL_COMMUNITY)
Admission: RE | Disposition: A | Discharge status: Home Health | Payer: Self-pay | Source: Ambulatory Visit | Attending: Orthopedic Surgery

## 2014-09-09 ENCOUNTER — Encounter (HOSPITAL_COMMUNITY): Payer: Self-pay | Admitting: *Deleted

## 2014-09-09 ENCOUNTER — Inpatient Hospital Stay (HOSPITAL_COMMUNITY)
Admission: RE | Admit: 2014-09-09 | Discharge: 2014-09-11 | DRG: 468 | Disposition: A | Discharge status: Home Health | Payer: Medicare Other | Source: Ambulatory Visit | Attending: Orthopedic Surgery | Admitting: Orthopedic Surgery

## 2014-09-09 DIAGNOSIS — R110 Nausea: Secondary | ICD-10-CM | POA: Diagnosis not present

## 2014-09-09 DIAGNOSIS — Z880 Allergy status to penicillin: Secondary | ICD-10-CM | POA: Diagnosis not present

## 2014-09-09 DIAGNOSIS — Z01810 Encounter for preprocedural cardiovascular examination: Secondary | ICD-10-CM | POA: Diagnosis not present

## 2014-09-09 DIAGNOSIS — F17210 Nicotine dependence, cigarettes, uncomplicated: Secondary | ICD-10-CM | POA: Diagnosis present

## 2014-09-09 DIAGNOSIS — M65852 Other synovitis and tenosynovitis, left thigh: Secondary | ICD-10-CM | POA: Diagnosis present

## 2014-09-09 DIAGNOSIS — E663 Overweight: Secondary | ICD-10-CM | POA: Diagnosis present

## 2014-09-09 DIAGNOSIS — Y792 Prosthetic and other implants, materials and accessory orthopedic devices associated with adverse incidents: Secondary | ICD-10-CM | POA: Diagnosis present

## 2014-09-09 DIAGNOSIS — I10 Essential (primary) hypertension: Secondary | ICD-10-CM | POA: Diagnosis not present

## 2014-09-09 DIAGNOSIS — Z472 Encounter for removal of internal fixation device: Secondary | ICD-10-CM | POA: Diagnosis not present

## 2014-09-09 DIAGNOSIS — T84091A Other mechanical complication of internal left hip prosthesis, initial encounter: Secondary | ICD-10-CM | POA: Diagnosis not present

## 2014-09-09 DIAGNOSIS — M25552 Pain in left hip: Secondary | ICD-10-CM | POA: Diagnosis not present

## 2014-09-09 DIAGNOSIS — Z01812 Encounter for preprocedural laboratory examination: Secondary | ICD-10-CM | POA: Diagnosis not present

## 2014-09-09 DIAGNOSIS — Z8701 Personal history of pneumonia (recurrent): Secondary | ICD-10-CM | POA: Diagnosis not present

## 2014-09-09 DIAGNOSIS — Z96649 Presence of unspecified artificial hip joint: Secondary | ICD-10-CM

## 2014-09-09 DIAGNOSIS — Z96642 Presence of left artificial hip joint: Secondary | ICD-10-CM | POA: Diagnosis not present

## 2014-09-09 DIAGNOSIS — Z8614 Personal history of Methicillin resistant Staphylococcus aureus infection: Secondary | ICD-10-CM | POA: Diagnosis not present

## 2014-09-09 DIAGNOSIS — Z6828 Body mass index (BMI) 28.0-28.9, adult: Secondary | ICD-10-CM | POA: Diagnosis not present

## 2014-09-09 DIAGNOSIS — Z471 Aftercare following joint replacement surgery: Secondary | ICD-10-CM | POA: Diagnosis not present

## 2014-09-09 DIAGNOSIS — M7989 Other specified soft tissue disorders: Secondary | ICD-10-CM | POA: Diagnosis not present

## 2014-09-09 DIAGNOSIS — Z887 Allergy status to serum and vaccine status: Secondary | ICD-10-CM | POA: Diagnosis not present

## 2014-09-09 HISTORY — PX: ANTERIOR HIP REVISION: SHX6527

## 2014-09-09 LAB — TYPE AND SCREEN
ABO/RH(D): O POS
ANTIBODY SCREEN: NEGATIVE

## 2014-09-09 LAB — GLUCOSE, CAPILLARY: Glucose-Capillary: 144 mg/dL — ABNORMAL HIGH (ref 65–99)

## 2014-09-09 SURGERY — REVISION, TOTAL ARTHROPLASTY, HIP, ANTERIOR APPROACH
Anesthesia: General | Site: Hip | Laterality: Left

## 2014-09-09 MED ORDER — SODIUM CHLORIDE 0.9 % IV SOLN
100.0000 mL/h | INTRAVENOUS | Status: DC
  Administered 2014-09-10: 100 mL/h via INTRAVENOUS
  Filled 2014-09-09 (×12): qty 1000

## 2014-09-09 MED ORDER — ROCURONIUM BROMIDE 100 MG/10ML IV SOLN
INTRAVENOUS | Status: DC | PRN
  Administered 2014-09-09: 5 mg via INTRAVENOUS
  Administered 2014-09-09: 35 mg via INTRAVENOUS

## 2014-09-09 MED ORDER — BISACODYL 10 MG RE SUPP: 10.0000 mg | Freq: Every day | RECTAL | Status: DC | PRN

## 2014-09-09 MED ORDER — HYDROMORPHONE HCL 1 MG/ML IJ SOLN
0.2500 mg | INTRAMUSCULAR | Status: DC | PRN
  Administered 2014-09-09 (×6): 0.5 mg via INTRAVENOUS

## 2014-09-09 MED ORDER — ONDANSETRON HCL 4 MG/2ML IJ SOLN: 4.0000 mg | Freq: Four times a day (QID) | INTRAMUSCULAR | Status: DC | PRN

## 2014-09-09 MED ORDER — HYDROMORPHONE HCL 1 MG/ML IJ SOLN
0.5000 mg | INTRAMUSCULAR | Status: DC | PRN
  Administered 2014-09-09: 1 mg via INTRAVENOUS
  Filled 2014-09-09: qty 1

## 2014-09-09 MED ORDER — FENTANYL CITRATE (PF) 100 MCG/2ML IJ SOLN
INTRAMUSCULAR | Status: AC
Start: 2014-09-09 — End: 2014-09-09
  Filled 2014-09-09: qty 2

## 2014-09-09 MED ORDER — FENTANYL CITRATE (PF) 100 MCG/2ML IJ SOLN
INTRAMUSCULAR | Status: DC | PRN
  Administered 2014-09-09: 50 ug via INTRAVENOUS
  Administered 2014-09-09 (×2): 25 ug via INTRAVENOUS
  Administered 2014-09-09 (×4): 50 ug via INTRAVENOUS

## 2014-09-09 MED ORDER — ONDANSETRON HCL 4 MG/2ML IJ SOLN
INTRAMUSCULAR | Status: DC | PRN
  Administered 2014-09-09: 4 mg via INTRAVENOUS

## 2014-09-09 MED ORDER — HYDROMORPHONE HCL 1 MG/ML IJ SOLN
INTRAMUSCULAR | Status: AC
  Filled 2014-09-09: qty 1

## 2014-09-09 MED ORDER — EPHEDRINE SULFATE 50 MG/ML IJ SOLN
INTRAMUSCULAR | Status: AC
  Filled 2014-09-09: qty 2

## 2014-09-09 MED ORDER — PHENOL 1.4 % MT LIQD: 1.0000 | OROMUCOSAL | Status: DC | PRN

## 2014-09-09 MED ORDER — CELECOXIB 200 MG PO CAPS
200.0000 mg | ORAL_CAPSULE | Freq: Two times a day (BID) | ORAL | Status: DC
  Administered 2014-09-09 – 2014-09-11 (×4): 200 mg via ORAL
  Filled 2014-09-09 (×5): qty 1

## 2014-09-09 MED ORDER — PHENYLEPHRINE HCL 10 MG/ML IJ SOLN
INTRAMUSCULAR | Status: DC | PRN
  Administered 2014-09-09: 40 ug via INTRAVENOUS

## 2014-09-09 MED ORDER — DOCUSATE SODIUM 100 MG PO CAPS
100.0000 mg | ORAL_CAPSULE | Freq: Two times a day (BID) | ORAL | Status: DC
  Administered 2014-09-09 – 2014-09-11 (×4): 100 mg via ORAL

## 2014-09-09 MED ORDER — ASPIRIN EC 325 MG PO TBEC
325.0000 mg | DELAYED_RELEASE_TABLET | Freq: Two times a day (BID) | ORAL | Status: DC
  Administered 2014-09-10 – 2014-09-11 (×3): 325 mg via ORAL
  Filled 2014-09-09 (×5): qty 1

## 2014-09-09 MED ORDER — ONDANSETRON HCL 4 MG PO TABS
4.0000 mg | ORAL_TABLET | Freq: Four times a day (QID) | ORAL | Status: DC | PRN
  Administered 2014-09-10 – 2014-09-11 (×2): 4 mg via ORAL
  Filled 2014-09-09 (×2): qty 1

## 2014-09-09 MED ORDER — ONDANSETRON HCL 4 MG/2ML IJ SOLN
INTRAMUSCULAR | Status: AC
  Filled 2014-09-09: qty 2

## 2014-09-09 MED ORDER — ACETAMINOPHEN 10 MG/ML IV SOLN
1000.0000 mg | Freq: Once | INTRAVENOUS | Status: AC
  Administered 2014-09-09: 1000 mg via INTRAVENOUS
  Filled 2014-09-09: qty 100

## 2014-09-09 MED ORDER — PROPOFOL 10 MG/ML IV BOLUS
INTRAVENOUS | Status: DC | PRN
  Administered 2014-09-09: 150 mg via INTRAVENOUS

## 2014-09-09 MED ORDER — METOPROLOL SUCCINATE ER 50 MG PO TB24
50.0000 mg | ORAL_TABLET | Freq: Every day | ORAL | Status: DC
  Administered 2014-09-09: 50 mg via ORAL
  Filled 2014-09-09: qty 1

## 2014-09-09 MED ORDER — DEXAMETHASONE SODIUM PHOSPHATE 10 MG/ML IJ SOLN
10.0000 mg | Freq: Once | INTRAMUSCULAR | Status: AC
  Administered 2014-09-10: 10 mg via INTRAVENOUS
  Filled 2014-09-09: qty 1

## 2014-09-09 MED ORDER — METOCLOPRAMIDE HCL 5 MG/ML IJ SOLN
INTRAMUSCULAR | Status: AC
  Filled 2014-09-09: qty 2

## 2014-09-09 MED ORDER — LIDOCAINE HCL (CARDIAC) 20 MG/ML IV SOLN
INTRAVENOUS | Status: AC
  Filled 2014-09-09: qty 5

## 2014-09-09 MED ORDER — NEOSTIGMINE METHYLSULFATE 10 MG/10ML IV SOLN
INTRAVENOUS | Status: DC | PRN
  Administered 2014-09-09: 4 mg via INTRAVENOUS

## 2014-09-09 MED ORDER — SODIUM CHLORIDE 0.9 % IR SOLN
Status: DC | PRN
  Administered 2014-09-09: 3000 mL
  Administered 2014-09-09: 1000 mL

## 2014-09-09 MED ORDER — PROPOFOL 10 MG/ML IV BOLUS
INTRAVENOUS | Status: AC
  Filled 2014-09-09: qty 20

## 2014-09-09 MED ORDER — DIPHENHYDRAMINE HCL 25 MG PO CAPS: 25.0000 mg | ORAL_CAPSULE | Freq: Four times a day (QID) | ORAL | Status: DC | PRN

## 2014-09-09 MED ORDER — SODIUM CHLORIDE 0.9 % IV SOLN
1000.0000 mg | Freq: Once | INTRAVENOUS | Status: AC
  Administered 2014-09-09: 1000 mg via INTRAVENOUS
  Filled 2014-09-09: qty 10

## 2014-09-09 MED ORDER — CEFAZOLIN SODIUM-DEXTROSE 2-3 GM-% IV SOLR
2.0000 g | Freq: Four times a day (QID) | INTRAVENOUS | Status: AC
  Administered 2014-09-09 (×2): 2 g via INTRAVENOUS
  Filled 2014-09-09 (×2): qty 50

## 2014-09-09 MED ORDER — MAGNESIUM CITRATE PO SOLN: 1.0000 | Freq: Once | ORAL | Status: AC | PRN

## 2014-09-09 MED ORDER — GLYCOPYRROLATE 0.2 MG/ML IJ SOLN
INTRAMUSCULAR | Status: AC
  Filled 2014-09-09: qty 3

## 2014-09-09 MED ORDER — NEOSTIGMINE METHYLSULFATE 10 MG/10ML IV SOLN
INTRAVENOUS | Status: AC
  Filled 2014-09-09: qty 1

## 2014-09-09 MED ORDER — METOPROLOL SUCCINATE ER 50 MG PO TB24
50.0000 mg | ORAL_TABLET | Freq: Every morning | ORAL | Status: DC
  Administered 2014-09-11: 50 mg via ORAL
  Filled 2014-09-09 (×2): qty 1

## 2014-09-09 MED ORDER — METOCLOPRAMIDE HCL 5 MG/ML IJ SOLN
5.0000 mg | Freq: Three times a day (TID) | INTRAMUSCULAR | Status: DC | PRN
  Administered 2014-09-09: 10 mg via INTRAVENOUS
  Filled 2014-09-09 (×2): qty 2

## 2014-09-09 MED ORDER — DEXAMETHASONE SODIUM PHOSPHATE 10 MG/ML IJ SOLN
INTRAMUSCULAR | Status: AC
  Filled 2014-09-09: qty 1

## 2014-09-09 MED ORDER — METHOCARBAMOL 500 MG PO TABS
500.0000 mg | ORAL_TABLET | Freq: Four times a day (QID) | ORAL | Status: DC | PRN
  Administered 2014-09-10 (×2): 500 mg via ORAL
  Filled 2014-09-09 (×2): qty 1

## 2014-09-09 MED ORDER — HYDROCODONE-ACETAMINOPHEN 7.5-325 MG PO TABS
1.0000 | ORAL_TABLET | ORAL | Status: DC
  Administered 2014-09-09: 1 via ORAL
  Administered 2014-09-09 – 2014-09-10 (×2): 2 via ORAL
  Administered 2014-09-10 – 2014-09-11 (×3): 1 via ORAL
  Administered 2014-09-11: 2 via ORAL
  Filled 2014-09-09: qty 2
  Filled 2014-09-09: qty 1
  Filled 2014-09-09: qty 2
  Filled 2014-09-09 (×3): qty 1
  Filled 2014-09-09: qty 2

## 2014-09-09 MED ORDER — EPHEDRINE SULFATE 50 MG/ML IJ SOLN
INTRAMUSCULAR | Status: DC | PRN
  Administered 2014-09-09 (×3): 10 mg via INTRAVENOUS
  Administered 2014-09-09 (×3): 5 mg via INTRAVENOUS
  Administered 2014-09-09: 10 mg via INTRAVENOUS
  Administered 2014-09-09: 5 mg via INTRAVENOUS
  Administered 2014-09-09 (×2): 10 mg via INTRAVENOUS
  Administered 2014-09-09: 5 mg via INTRAVENOUS
  Administered 2014-09-09: 15 mg via INTRAVENOUS

## 2014-09-09 MED ORDER — EPHEDRINE SULFATE 50 MG/ML IJ SOLN
INTRAMUSCULAR | Status: AC
  Filled 2014-09-09: qty 1

## 2014-09-09 MED ORDER — METHOCARBAMOL 1000 MG/10ML IJ SOLN
500.0000 mg | Freq: Four times a day (QID) | INTRAVENOUS | Status: DC | PRN
  Administered 2014-09-09: 500 mg via INTRAVENOUS
  Filled 2014-09-09 (×2): qty 5

## 2014-09-09 MED ORDER — CEFAZOLIN SODIUM-DEXTROSE 2-3 GM-% IV SOLR
2.0000 g | INTRAVENOUS | Status: AC
  Administered 2014-09-09: 2 g via INTRAVENOUS

## 2014-09-09 MED ORDER — MENTHOL 3 MG MT LOZG
1.0000 | LOZENGE | OROMUCOSAL | Status: DC | PRN
  Filled 2014-09-09: qty 9

## 2014-09-09 MED ORDER — MIDAZOLAM HCL 5 MG/5ML IJ SOLN
INTRAMUSCULAR | Status: DC | PRN
  Administered 2014-09-09 (×2): 1 mg via INTRAVENOUS

## 2014-09-09 MED ORDER — DEXAMETHASONE SODIUM PHOSPHATE 10 MG/ML IJ SOLN
10.0000 mg | Freq: Once | INTRAMUSCULAR | Status: AC
  Administered 2014-09-09: 10 mg via INTRAVENOUS

## 2014-09-09 MED ORDER — POLYETHYLENE GLYCOL 3350 17 G PO PACK
17.0000 g | PACK | Freq: Two times a day (BID) | ORAL | Status: DC
  Administered 2014-09-10 – 2014-09-11 (×2): 17 g via ORAL

## 2014-09-09 MED ORDER — FENTANYL CITRATE (PF) 250 MCG/5ML IJ SOLN
INTRAMUSCULAR | Status: AC
  Filled 2014-09-09: qty 5

## 2014-09-09 MED ORDER — CHLORHEXIDINE GLUCONATE 4 % EX LIQD: 60.0000 mL | Freq: Once | CUTANEOUS | Status: DC

## 2014-09-09 MED ORDER — CEFAZOLIN SODIUM-DEXTROSE 2-3 GM-% IV SOLR
INTRAVENOUS | Status: AC
  Filled 2014-09-09: qty 50

## 2014-09-09 MED ORDER — PHENYLEPHRINE HCL 10 MG/ML IJ SOLN
INTRAMUSCULAR | Status: AC
  Filled 2014-09-09: qty 1

## 2014-09-09 MED ORDER — FERROUS SULFATE 325 (65 FE) MG PO TABS
325.0000 mg | ORAL_TABLET | Freq: Three times a day (TID) | ORAL | Status: DC
  Administered 2014-09-10 – 2014-09-11 (×3): 325 mg via ORAL
  Filled 2014-09-09 (×7): qty 1

## 2014-09-09 MED ORDER — HYDROMORPHONE HCL 1 MG/ML IJ SOLN
INTRAMUSCULAR | Status: AC
  Filled 2014-09-09: qty 2

## 2014-09-09 MED ORDER — ALUM & MAG HYDROXIDE-SIMETH 200-200-20 MG/5ML PO SUSP: 30.0000 mL | ORAL | Status: DC | PRN

## 2014-09-09 MED ORDER — MIDAZOLAM HCL 2 MG/2ML IJ SOLN
INTRAMUSCULAR | Status: AC
  Filled 2014-09-09: qty 2

## 2014-09-09 MED ORDER — PHENYLEPHRINE HCL 10 MG/ML IJ SOLN
10.0000 mg | INTRAVENOUS | Status: DC | PRN
  Administered 2014-09-09: 50 ug/min via INTRAVENOUS

## 2014-09-09 MED ORDER — LACTATED RINGERS IV SOLN
INTRAVENOUS | Status: DC | PRN
  Administered 2014-09-09 (×3): via INTRAVENOUS

## 2014-09-09 MED ORDER — GLYCOPYRROLATE 0.2 MG/ML IJ SOLN
INTRAMUSCULAR | Status: DC | PRN
Start: 2014-09-09 — End: 2014-09-09
  Administered 2014-09-09: 0.1 mg via INTRAVENOUS
  Administered 2014-09-09: 0.4 mg via INTRAVENOUS

## 2014-09-09 MED ORDER — ROCURONIUM BROMIDE 100 MG/10ML IV SOLN
INTRAVENOUS | Status: AC
  Filled 2014-09-09: qty 1

## 2014-09-09 MED ORDER — LACTATED RINGERS IV SOLN
INTRAVENOUS | Status: DC
  Administered 2014-09-09: 10:00:00 via INTRAVENOUS

## 2014-09-09 MED ORDER — METOCLOPRAMIDE HCL 10 MG PO TABS
5.0000 mg | ORAL_TABLET | Freq: Three times a day (TID) | ORAL | Status: DC | PRN
  Administered 2014-09-10: 10 mg via ORAL
  Filled 2014-09-09: qty 1

## 2014-09-09 MED ORDER — LIDOCAINE HCL (CARDIAC) 20 MG/ML IV SOLN
INTRAVENOUS | Status: DC | PRN
  Administered 2014-09-09: 10 mg via INTRAVENOUS
  Administered 2014-09-09: 50 mg via INTRAVENOUS
  Administered 2014-09-09: 30 mg via INTRAVENOUS

## 2014-09-09 MED ORDER — HYDROMORPHONE HCL 1 MG/ML IJ SOLN: 0.2500 mg | INTRAMUSCULAR | Status: DC | PRN

## 2014-09-09 SURGICAL SUPPLY — 53 items
BAG ZIPLOCK 12X15 (MISCELLANEOUS) IMPLANT
COVER PERINEAL POST (MISCELLANEOUS) IMPLANT
DRAPE C-ARM 42X120 X-RAY (DRAPES) ×3 IMPLANT
DRAPE INCISE IOBAN 85X60 (DRAPES) ×3 IMPLANT
DRAPE ORTHO SPLIT 77X108 STRL (DRAPES) ×4
DRAPE STERI IOBAN 125X83 (DRAPES) ×3 IMPLANT
DRAPE SURG ORHT 6 SPLT 77X108 (DRAPES) ×2 IMPLANT
DRAPE U-SHAPE 47X51 STRL (DRAPES) ×9 IMPLANT
DRSG AQUACEL AG ADV 3.5X10 (GAUZE/BANDAGES/DRESSINGS) IMPLANT
DRSG AQUACEL AG ADV 3.5X14 (GAUZE/BANDAGES/DRESSINGS) ×3 IMPLANT
DRSG TEGADERM 4X4.75 (GAUZE/BANDAGES/DRESSINGS) ×3 IMPLANT
DURAPREP 26ML APPLICATOR (WOUND CARE) ×3 IMPLANT
ELECT BLADE TIP CTD 4 INCH (ELECTRODE) ×3 IMPLANT
ELECT PENCIL ROCKER SW 15FT (MISCELLANEOUS) ×3 IMPLANT
ELECT REM PT RETURN 15FT ADLT (MISCELLANEOUS) ×3 IMPLANT
ELECT REM PT RETURN 9FT ADLT (ELECTROSURGICAL) ×3
ELECTRODE REM PT RTRN 9FT ADLT (ELECTROSURGICAL) ×1 IMPLANT
ELIMINATOR HOLE APEX DEPUY (Hips) ×3 IMPLANT
EVACUATOR 1/8 PVC DRAIN (DRAIN) ×3 IMPLANT
FACESHIELD WRAPAROUND (MASK) ×12 IMPLANT
GAUZE SPONGE 2X2 8PLY STRL LF (GAUZE/BANDAGES/DRESSINGS) ×1 IMPLANT
GLOVE BIOGEL PI IND STRL 7.5 (GLOVE) ×1 IMPLANT
GLOVE BIOGEL PI IND STRL 8.5 (GLOVE) ×1 IMPLANT
GLOVE BIOGEL PI INDICATOR 7.5 (GLOVE) ×2
GLOVE BIOGEL PI INDICATOR 8.5 (GLOVE) ×2
GLOVE ECLIPSE 8.0 STRL XLNG CF (GLOVE) ×3 IMPLANT
GLOVE ORTHO TXT STRL SZ7.5 (GLOVE) ×6 IMPLANT
GOWN SPEC L3 XXLG W/TWL (GOWN DISPOSABLE) ×3 IMPLANT
GOWN STRL REUS W/TWL LRG LVL3 (GOWN DISPOSABLE) ×3 IMPLANT
GRAFT IC CHAMBER LG (Bone Implant) ×3 IMPLANT
HANDPIECE INTERPULSE COAX TIP (DISPOSABLE) ×2
HEAD FEM BIOLOX DELTA 36 8.5 (Orthopedic Implant) ×3 IMPLANT
HOLDER FOLEY CATH W/STRAP (MISCELLANEOUS) ×3 IMPLANT
KIT BASIN OR (CUSTOM PROCEDURE TRAY) ×3 IMPLANT
LINER NEUTRAL 52X36MM PLUS 4 (Liner) ×3 IMPLANT
LIQUID BAND (GAUZE/BANDAGES/DRESSINGS) IMPLANT
PACK TOTAL JOINT (CUSTOM PROCEDURE TRAY) ×3 IMPLANT
PIN SECTOR W/GRIP ACE CUP 52MM (Hips) ×3 IMPLANT
SAW OSC TIP CART 19.5X105X1.3 (SAW) IMPLANT
SCREW 6.5MMX30MM (Screw) ×3 IMPLANT
SCREW 6.5MMX35MM (Screw) ×3 IMPLANT
SET HNDPC FAN SPRY TIP SCT (DISPOSABLE) ×1 IMPLANT
SPONGE GAUZE 2X2 STER 10/PKG (GAUZE/BANDAGES/DRESSINGS) ×2
SUT MNCRL AB 4-0 PS2 18 (SUTURE) ×3 IMPLANT
SUT VIC AB 1 CT1 36 (SUTURE) ×3 IMPLANT
SUT VIC AB 2-0 CT1 27 (SUTURE) ×4
SUT VIC AB 2-0 CT1 TAPERPNT 27 (SUTURE) ×2 IMPLANT
SUT VLOC 180 0 24IN GS25 (SUTURE) ×3 IMPLANT
TOWEL NATURAL 10PK STERILE (DISPOSABLE) ×3 IMPLANT
TOWEL OR 17X26 10 PK STRL BLUE (TOWEL DISPOSABLE) ×3 IMPLANT
TOWEL OR NON WOVEN STRL DISP B (DISPOSABLE) ×3 IMPLANT
TRAY FOLEY W/METER SILVER 14FR (SET/KITS/TRAYS/PACK) ×3 IMPLANT
WATER STERILE IRR 1500ML POUR (IV SOLUTION) ×3 IMPLANT

## 2014-09-09 NOTE — Brief Op Note (Signed)
09/09/2014  9:26 AM  PATIENT:  Kendra HarrisPamela Mcgrath  64 y.o. 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  PROCEDURE:  Procedure(s): LEFT TOTAL POSTERIOR HIP REVISION with auto and allograft in acetabulum  SURGEON:  Surgeon(s) and Role:    * Durene RomansMatthew Morgan Rennert, MD - Primary  PHYSICIAN ASSISTANT: Lanney GinsMatthew Babish, PA-C  ANESTHESIA:   general  EBL:  Total I/O In: 2000 [I.V.:2000] Out: 675 [Urine:150; Blood:525]  BLOOD ADMINISTERED:none  DRAINS: (1 medium) Hemovact drain(s) in the left hip with  Suction Open   LOCAL MEDICATIONS USED:  NONE  SPECIMEN:  Source of Specimen:  left hip bursa and synovial tissue as well as metal components removed  DISPOSITION OF SPECIMEN:  PATHOLOGY  COUNTS:  YES  TOURNIQUET:  * No tourniquets in log *  DICTATION: .Other Dictation: Dictation Number [id number]  PLAN OF CARE: Admit to inpatient   PATIENT DISPOSITION:  PACU - hemodynamically stable.   Delay start of Pharmacological VTE agent (>24hrs) due to surgical blood loss or risk of bleeding: no

## 2014-09-09 NOTE — Anesthesia Postprocedure Evaluation (Signed)
  Anesthesia Post-op Note  Patient: Kendra HarrisPamela Mcgrath  Procedure(s) Performed: Procedure(s) (LRB): LEFT TOTAL POSTERIOR HIP REVISION (Left)  Patient Location: PACU  Anesthesia Type: General  Level of Consciousness: awake and alert   Airway and Oxygen Therapy: Patient Spontanous Breathing  Post-op Pain: mild  Post-op Assessment: Post-op Vital signs reviewed, Patient's Cardiovascular Status Stable, Respiratory Function Stable, Patent Airway and No signs of Nausea or vomiting  Last Vitals:  Filed Vitals:   09/09/14 1115  BP:   Pulse: 63  Temp:   Resp: 7    Post-op Vital Signs: stable   Complications: No apparent anesthesia complications

## 2014-09-09 NOTE — Anesthesia Procedure Notes (Signed)
Procedure Name: Intubation Date/Time: 09/09/2014 7:31 AM Performed by: Kendra Mcgrath, Kendra Mcgrath Pre-anesthesia Checklist: Patient identified, Emergency Drugs available, Suction available and Patient being monitored Patient Re-evaluated:Patient Re-evaluated prior to inductionOxygen Delivery Method: Circle System Utilized Preoxygenation: Pre-oxygenation with 100% oxygen Intubation Type: IV induction Ventilation: Mask ventilation without difficulty Laryngoscope Size: Mac and 3 Grade View: Grade I Tube type: Oral Number of attempts: 1 Placement Confirmation: ETT inserted through vocal cords under direct vision,  positive ETCO2 and breath sounds checked- equal and bilateral Secured at: 18 cm Tube secured with: Tape Dental Injury: Teeth and Oropharynx as per pre-operative assessment

## 2014-09-09 NOTE — Interval H&P Note (Signed)
History and Physical Interval Note:  09/09/2014 6:39 AM  Kendra Mcgrath  has presented today for surgery, with the diagnosis of failed left total hip arthroplasty  The various methods of treatment have been discussed with the patient and family. After consideration of risks, benefits and other options for treatment, the patient has consented to  Procedure(s): LEFT ANTERIOR HIP REVISION (Left) as a surgical intervention .  The patient's history has been reviewed, patient examined, no change in status, stable for surgery.  I have reviewed the patient's chart and labs.  Questions were answered to the patient's satisfaction.     Shelda PalLIN,Helga Asbury D

## 2014-09-09 NOTE — Transfer of Care (Signed)
Immediate Anesthesia Transfer of Care Note  Patient: Kendra Mcgrath  Procedure(s) Performed: Procedure(s): LEFT TOTAL POSTERIOR HIP REVISION (Left)  Patient Location: PACU  Anesthesia Type:General  Level of Consciousness: Patient easily awoken, sedated, comfortable, cooperative, following commands, responds to stimulation.   Airway & Oxygen Therapy: Patient spontaneously breathing, ventilating well, oxygen via simple oxygen mask.  Post-op Assessment: Report given to PACU RN, vital signs reviewed and stable, moving all extremities.   Post vital signs: Reviewed and stable.  Complications: No apparent anesthesia complications

## 2014-09-09 NOTE — Progress Notes (Signed)
PT Cancellation Note  Patient Details Name: Kendra Mcgrath MRN: [id number] DOB: 04/11/1951   Cancelled Treatment:    Reason Eval/Treat Not Completed: Fatigue/lethargy limiting ability to participate   [facility] LLCWILLIAMS,Corneluis Allston 09/09/2014, 2:40 PM

## 2014-09-09 NOTE — Discharge Instructions (Signed)

## 2014-09-09 NOTE — Progress Notes (Signed)
Was seen by cardiologist for stress test high cholesterol

## 2014-09-10 ENCOUNTER — Encounter (HOSPITAL_COMMUNITY): Payer: Self-pay | Admitting: Orthopedic Surgery

## 2014-09-10 LAB — CBC
HEMATOCRIT: 25.4 % — AB (ref 36.0–46.0)
Hemoglobin: 8.4 g/dL — ABNORMAL LOW (ref 12.0–15.0)
MCH: 27.6 pg (ref 26.0–34.0)
MCHC: 33.1 g/dL (ref 30.0–36.0)
MCV: 83.6 fL (ref 78.0–100.0)
Platelets: 187 10*3/uL (ref 150–400)
RBC: 3.04 MIL/uL — ABNORMAL LOW (ref 3.87–5.11)
RDW: 14.1 % (ref 11.5–15.5)
WBC: 9.9 10*3/uL (ref 4.0–10.5)

## 2014-09-10 LAB — BASIC METABOLIC PANEL
Anion gap: 9 (ref 5–15)
BUN: 16 mg/dL (ref 6–20)
CHLORIDE: 106 mmol/L (ref 101–111)
CO2: 22 mmol/L (ref 22–32)
CREATININE: 0.67 mg/dL (ref 0.44–1.00)
Calcium: 8.1 mg/dL — ABNORMAL LOW (ref 8.9–10.3)
GFR calc non Af Amer: >60 mL/min (ref >60)
Glucose, Bld: 137 mg/dL — ABNORMAL HIGH (ref 65–99)
POTASSIUM: 4.6 mmol/L (ref 3.5–5.1)
Sodium: 137 mmol/L (ref 135–145)

## 2014-09-10 MED ORDER — PROMETHAZINE HCL 12.5 MG PO TABS: 12.5000 mg | ORAL_TABLET | Freq: Four times a day (QID) | ORAL | Status: DC | PRN

## 2014-09-10 MED ORDER — DOCUSATE SODIUM 100 MG PO CAPS: 100.0000 mg | ORAL_CAPSULE | Freq: Two times a day (BID) | ORAL | Status: DC

## 2014-09-10 MED ORDER — ASPIRIN 325 MG PO TBEC: 325.0000 mg | DELAYED_RELEASE_TABLET | Freq: Two times a day (BID) | ORAL | Status: AC

## 2014-09-10 MED ORDER — POLYETHYLENE GLYCOL 3350 17 G PO PACK: 17.0000 g | PACK | Freq: Two times a day (BID) | ORAL | Status: DC

## 2014-09-10 MED ORDER — ATORVASTATIN CALCIUM 40 MG PO TABS
40.0000 mg | ORAL_TABLET | Freq: Every day | ORAL | Status: DC
  Administered 2014-09-10: 40 mg via ORAL
  Filled 2014-09-10 (×2): qty 1

## 2014-09-10 MED ORDER — METHOCARBAMOL 500 MG PO TABS: 500.0000 mg | ORAL_TABLET | Freq: Four times a day (QID) | ORAL | Status: DC | PRN

## 2014-09-10 MED ORDER — FERROUS SULFATE 325 (65 FE) MG PO TABS: 325.0000 mg | ORAL_TABLET | Freq: Three times a day (TID) | ORAL | Status: DC

## 2014-09-10 MED ORDER — HYDROCODONE-ACETAMINOPHEN 7.5-325 MG PO TABS: 1.0000 | ORAL_TABLET | ORAL | Status: DC | PRN

## 2014-09-10 NOTE — Progress Notes (Signed)
Patient ID: Kendra Mcgrath, female   DOB: 03/12/1951, 64 y.o.   MRN: [id number] Subjective: 1 Day Post-Op Procedure(s) (LRB): LEFT TOTAL POSTERIOR HIP REVISION (Left)    Patient reports pain as mild.  More an issue with nausea last night, otherwise no events  Objective:   VITALS:   Filed Vitals:   09/10/14 0647  BP: 118/70  Pulse: 93  Temp: 97.5 F (36.4 C)  Resp: 14    Neurovascular intact Incision: dressing C/D/I  LABS  Recent Labs  09/10/14 0533  HGB 8.4*  HCT 25.4*  WBC 9.9  PLT 187     Recent Labs  09/10/14 0533  NA 137  K 4.6  BUN 16  CREATININE 0.67  GLUCOSE 137*    No results for input(s): LABPT, INR in the last 72 hours.   Assessment/Plan: 1 Day Post-Op Procedure(s) (LRB): LEFT TOTAL POSTERIOR HIP REVISION (Left)   Advance diet Up with therapy Plan for discharge tomorrow pending therapy evaluation

## 2014-09-10 NOTE — Op Note (Signed)
NAMShirl Mcgrath:  Mcgrath, Kendra               ACCOUNT NO.:  [id number]  MEDICAL RECORD NO.:  [id number]  LOCATION:  1606                         [facility]  PHYSICIAN:  Madlyn FrankelMatthew D. Charlann Boxerlin, M.D.  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  The patient was noted upon entry through her iliotibial band and tensor fascia and gluteus fascia to have significant bursal staining and significant metallosis contained within the peritrochanteric bursa that extended all the way down to the joint capsule.  Please note, it was important there was no muscle damage identified in the gluteus maximus, medius or minimus.  There was slight metallosis involving the acetabulum without significant bone destruction.  From an operative standpoint from an explanation of the metallosis, the best that I could tell at this point was related to posterior aspect for femoral neck abutting against an acetabular shell that was forward flexed perhaps leading to impingement and the creation of this metal debris.  These were findings consistent with the ALVAL response.  PROCEDURE:  Revision left total hip arthroplasty with allograft and autograft bone grafting of the acetabulum with acetabular.  SURGEON:  Madlyn FrankelMatthew D. Charlann Boxerlin, M.D.  ASSISTANT:  Lanney GinsMatthew Babish, PA-C.  Note that, Mr. Carmon SailsBabish was present for the entirety of the case from preoperative position, perioperative management of operative extremity, general facilitation of case and primary wound closure.  ANESTHESIA:  General.  SPECIMENS:  The patient's bursal and pericapsular tissues involved with this metallosis reaction were sent to Pathology for evaluation to evaluate for an ALVAL response.  In addition,  her acetabular shell, metal liner and ceramic head were sent.  The patient has retained counsel due to the metallosis concerns that this was not an implant involved with the initial DePuy recall.  DRAINS:  One medium Hemovac.  BLOOD LOSS:  About 500 mL.  COMPLICATIONS:  None apparent.  INDICATION FOR THE PROCEDURE:  Kendra Mcgrath is a 64 year old female patient of mine.  She had a history of a left total hip arthroplasty that was performed by me in November of 2008.  At that time, I had performed a converted failed hip surgery to a left total hip arthroplasty utilizing a DePuy modular metal shell and liner with a ceramic ball.  At that time of that hip surgery, I had performed several ceramic on metal hips based on data from [place] shown improved wear rates for the ceramic ball on metal liners.  She was then lost to follow up in our office after [place] out of [place] and 2009 was last visit she had in our office.  At that time, she had been doing well.  [place] to [place] and sought evaluation from myself, recently noted increasing pain and dysfunction in the left hip for some time.  On examination, she is noted to have significant crepitation around her left hip with a popping sensation. 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  Risks of dislocation after revision  surgery discussed, risk of infection, DVT, recurrence of symptoms all discussed and reviewed.  Consent was obtained for benefit of pain relief, improved function and durability.  The plan was to revise to a ceramic on polyethylene insert under current gold standard for management.  PROCEDURE IN DETAIL:  The patient was brought to the operative theater. Once adequate anesthesia, preoperative antibiotics, Ancef 2 g administered as well as 1 g of tranexamic acid and 10 mg of Decadron, she was positioned into the right lateral decubitus position with left side up.  The left lower extremity was then prepped and draped in sterile fashion.  Time-out was performed identifying the patient, planned procedure, and extremity.  Her old incision was identified and a portion of it utilized that she had a previous surgery from hip fracture with her index total hip arthroplasty in November 2008 for revision.  As I dissected down to the iliotibial band and gluteal fascia, we noted intact and normal tissues.  As I incised this layer, we identified metal stained bursal fluid immediately.  At this point, the vast majority of the case was carried out a debridement.  It was noted to extend anteriorly, I spent a tremendous amount of time exposing anteriorly, dissecting this bursal tissue off the normal tissue noting normal peak muscle tone, contractility and bleeding, no evidence of any necrosis at all.  I then extended this dissection posteriorly.  Bursal tissue then extended down to the capsular tissue with direct connection.  Again, lot of the time of this operation was carried out at dissection. Once I had dissected out and excised the soft tissue extending to anterior all the way to posterior and was sent to Pathology for evaluation.  I was then spent time exposing the posterior aspect of the hip again identifying intact and normal muscle, no evidence of any necrosis.  Based on the radiographic  assessment of her hip joint, I decided to revise the acetabular shell not only to allow for debridement of any potential metal debris behind the cup, but also change orientation the cup as it appeared to be slightly vertical and perhaps forward flexed too much, which would have been done somewhat intent with normal intent and revision setting to prevent dislocation.  Once I had exposed the posterior aspect of the hip and debrided all the metal-stained synovium, I then dislocated the hip, removed an old ceramic ball, which noted to have some striping related to perhaps metal debris changes to the ceramic ball itself.  The ceramic ball was intact. She was noted to have things within the femoral neck of the component, I elected to keep the femoral component, but this very well.  Best I can ascertain at this point that the metallosis problems most likely generated from her neck impingement on the posterior aspect of the acetabular shell.  The acetabular shell was removed using a vibration technique.  I then removed two cancellous screws and  then placed a liner and using the Inn[employer] Explant system, the acetabular shell was removed. At this point, I used a large curette and removed metallosis-stained bone tissue and that went into the anterior pubic region less so and no real findings posterior in the ischium or in the ilium.  Based on this defect that was remaining following the debridement, I began reaming with a 49 reamer, reamed up to 51 reamer and elected to use a 52 Gription Pinnacle cup, this was opened.  I then also opened 15 mL of allograft bone, mixed it with bone reamings as well as any bone off the backside of the shell and packed this into the acetabulum and to the pubic region as well as some anteriorly and reverse reamed with a 50 reamer.  I then impacted the 51 Pinnacle cup with good orientation.  At this point, using the angle device, it appeared to be flexed about 20 degrees  and abduction about 35-40 degrees.  Two cancellous screws were placed.  I then did place a trial liner.  During the trial reduction, I elected to use a +8.5 ball to provide some soft tissue stability in this revision setting going up from the +5 ball that we had removed.  The combined anteversion was about 50 degrees.  Despite being a little lax in extension, there was no evidence of any impingement with forward flexion and internal rotation, and no evidence of any impingement posteriorly at this point with extension and external rotation.  Given these findings, I removed the trial liner and placed a hole eliminator and then elected to use a 36+ 8.5 Delta ceramic ball with a titanium sleeve to match the titanium neck.  This was impacted onto a clean and dry trunnion and the hip was reduced.  The hip was irrigated multiple times to the case and with approximately 3 L of normal saline solution with pulse lavage throughout the case and again at this point.  I was able to reapproximate a little bit of the posterior pseudocapsule to the anterior gluteus, medius, minimus tissues.  I did elect to use a Hemovac drain based on some of the bursal and synovial bleeding, this was placed deep.  The remainder of the wound, at this point, was closed in layers with #1 Vicryl initially and then an 0 V-Loc suture.  The remainder of the wound was closed with 2-0 Vicryl and then a running 3-0 Monocryl. The hip was cleaned, dried and dressed sterilely using surgical glue and Aquacel dressing.  The drain site was dressed separately.  She was then brought to the recovery room in stable condition tolerating the procedure well.  We will allow her to be weightbearing as tolerated.  Based on acetabular fixation, we will have posterior hip precautions to be mindful for the next 6-8 weeks particularly given the soft tissue dissection require for surgery.     Madlyn FrankelMatthew D. Charlann Boxerlin, M.D.     MDO/MEDQ  D:   09/09/2014  T:  09/10/2014  Job:  [id number]

## 2014-09-10 NOTE — Evaluation (Signed)
Occupational Therapy Evaluation Patient Details Name: Kendra Mcgrath MRN: [id number] DOB: 03/06/1951 Today's Date: 09/10/2014    History of Present Illness s/p L THR   Clinical Impression   This 64 year old female was admitted for the above surgery. She is WBAT and has posterior THPs.  She will benefit from skilled OT to reinforce THPS during adls and toilet transfers to increase safety and independence with these.  Goals are for supervision level.  Pt currently needs up to max A for LB dressing.  She was independent prior to this sx.    Follow Up Recommendations  Supervision/Assistance - 24 hour;Home health OT    Equipment Recommendations  3 in 1 bedside comode    Recommendations for Other Services       Precautions / Restrictions Precautions Precautions: Posterior Hip;Fall Restrictions Weight Bearing Restrictions: No      Mobility Bed Mobility Overal bed mobility: Needs Assistance Bed Mobility: Supine to Sit     Supine to sit: Min guard     General bed mobility comments: cues for no internal rotation  Transfers Overall transfer level: Needs assistance Equipment used: Rolling walker (2 wheeled) Transfers: Sit to/from Stand Sit to Stand: Min guard         General transfer comment: cues for UE/LE placement    Balance                                            ADL Overall ADL's : Needs assistance/impaired     Grooming: Supervision/safety;Standing       Lower Body Bathing: Moderate assistance;Sit to/from stand       Lower Body Dressing: Maximal assistance;Sit to/from stand   Toilet Transfer: Min guard;Ambulation;BSC   Toileting- ArchitectClothing Manipulation and Hygiene: Supervision/safety;Sit to/from stand         General ADL Comments: ambulated to bathroom with cues for THPs and sequence, initially. Pt has h/o THA several years ago.  Reviewed precautions and ADLs.  Pt does have a reacher at home.  She needed cues during functional  activities not to break 90 degree angle and not to internally rotate.  She plans to sponge bathe at home     Vision     Perception     Praxis      Pertinent Vitals/Pain Pain Assessment: 0-10 Pain Score: 6  Pain Location: L hip Pain Descriptors / Indicators: Aching Pain Intervention(s): Limited activity within patient's tolerance;Monitored during session;Premedicated before session;Repositioned;Patient requesting pain meds-RN notified (declined more ice)     Hand Dominance     Extremity/Trunk Assessment Upper Extremity Assessment Upper Extremity Assessment: Overall WFL for tasks assessed           Communication Communication Communication: No difficulties   Cognition Arousal/Alertness: Awake/alert Behavior During Therapy: WFL for tasks assessed/performed Overall Cognitive Status: Within Functional Limits for tasks assessed (needs reinforcement for THPs)                     General Comments       Exercises       Shoulder Instructions      Home Living Family/patient expects to be discharged to:: Private residence Living Arrangements: Spouse/significant other Available Help at Discharge: Family               Bathroom Shower/Tub: Chief Strategy OfficerTub/shower unit   Bathroom Toilet: Standard  Additional Comments: pt has another bathroom with jack and jill set up with shower stall but 2 other family members use.  Will sponge bathe initially      Prior Functioning/Environment Level of Independence: Independent             OT Diagnosis: Acute pain;Generalized weakness   OT Problem List: Decreased strength;Decreased activity tolerance;Decreased knowledge of use of DME or AE;Decreased knowledge of precautions;Pain   OT Treatment/Interventions: Self-care/ADL training;DME and/or AE instruction;Patient/family education    OT Goals(Current goals can be found in the care plan section) Acute Rehab OT Goals Patient Stated Goal: get better OT Goal  Formulation: With patient Time For Goal Achievement: 09/17/14 Potential to Achieve Goals: Good ADL Goals Pt Will Perform Grooming: with supervision;standing Pt Will Perform Lower Body Bathing: with supervision;with adaptive equipment;sit to/from stand Pt Will Perform Lower Body Dressing: with supervision;with adaptive equipment;sit to/from stand Pt Will Transfer to Toilet: with supervision;ambulating;bedside commode Additional ADL Goal #1: pt will follow THPS without cues during ADLs/toilet transfers  OT Frequency: Min 2X/week   Barriers to D/C:            Co-evaluation              End of Session    Activity Tolerance: No increased pain Patient left: in chair;with call bell/phone within reach;with nursing/sitter in room   Time: 0917-0932 OT Time Calculation (min): 15 min Charges:  OT General Charges $OT Visit: 1 Procedure OT Evaluation $Initial OT Evaluation Tier I: 1 Procedure G-Codes:    Naliyah Neth 09/10/2014, 10:14 AM   Marica OtterMaryellen Shalia Bartko, OTR/L [phone] 09/10/2014

## 2014-09-10 NOTE — Care Management Note (Signed)
Case Management Note  Patient Details  Name: Kendra Mcgrath MRN: [id number] Date of Birth: 13-Aug-1950  Subjective/Objective:                  LEFT TOTAL POSTERIOR HIP REVISION (Left)  Action/Plan:  Discharge planning Expected Discharge Date:  09/11/14               Expected Discharge Plan:  [place]  In-House Referral:     Discharge planning Services  CM Consult  Post Acute Care Choice:  Home Health Choice offered to:  Patient  DME Arranged:  3-N-1, Walker rolling DME Agency:     HH Arranged:  PT HH Agency:  [facility]  Status of Service:  Completed, signed off  Medicare Important Message Given:    Date Medicare IM Given:    Medicare IM give by:    Date Additional Medicare IM Given:    Additional Medicare Important Message give by:     If discussed at [place] of Stay Meetings, dates discussed:    Additional Comments: CM met with pt in room to offer choice of home health agency.  Pt chooses [facility] to render HHPT.  Address and contact information verified by pt.  CM called [facility] DME rep, Lecretia to please deliver the rolling walker and 3n1 to room prior to discharge.  Referral for HHPT emailed to [employer], Tim.  No other CM needs were communicated.   Dellie Catholic, RN 09/10/2014, 11:06 AM

## 2014-09-10 NOTE — Evaluation (Signed)
Physical Therapy Evaluation Patient Details Name: Kendra Mcgrath MRN: [id number] DOB: 02/25/1951 Today's Date: 09/10/2014   History of Present Illness  s/p L THR  Clinical Impression  Patient tolerated well,  Plans Dc tomorrow. Patient will benefit from PT to address problems listed below.    Follow Up Recommendations Home health PT;Supervision/Assistance - 24 hour    Equipment Recommendations  None recommended by PT    Recommendations for Other Services       Precautions / Restrictions Precautions Precautions: Posterior Hip;Fall Restrictions Weight Bearing Restrictions: No      Mobility  Bed Mobility Overal bed mobility: Needs Assistance Bed Mobility: Sit to Supine     Supine to sit: Min guard Sit to supine: Min guard   General bed mobility comments: cues for no internal rotation  Transfers Overall transfer level: Needs assistance Equipment used: Rolling walker (2 wheeled) Transfers: Sit to/from Stand Sit to Stand: Min guard         General transfer comment: cues for UE/LE placement  Ambulation/Gait Ambulation/Gait assistance: Min guard Ambulation Distance (Feet): 100 Feet Assistive device: Rolling walker (2 wheeled)       General Gait Details: Patient  tolerated  well, plans Dc tomorrow, patient will benefit from PT to address problems listed beow  Stairs            Wheelchair Mobility    Modified Rankin (Stroke Patients Only)       Balance                                             Pertinent Vitals/Pain Pain Assessment: No/denies pain Pain Score: 3  Pain Location: L hip Pain Descriptors / Indicators: Aching;Tightness Pain Intervention(s): Monitored during session;Premedicated before session;Ice applied    Home Living Family/patient expects to be discharged to:: Private residence Living Arrangements: Spouse/significant other Available Help at Discharge: Family Type of Home: House Home Access: Stairs to enter    Secretary/administratorntrance Stairs-Number of Steps: 1 Home Layout: One level Home Equipment: Environmental consultantWalker - 2 wheels;Other (comment) (forearm crutches) Additional Comments: pt has another bathroom with jack and jill set up with shower stall but 2 other family members use.  Will sponge bathe initially    Prior Function Level of Independence: Independent               Hand Dominance        Extremity/Trunk Assessment   Upper Extremity Assessment: Overall WFL for tasks assessed           Lower Extremity Assessment: LLE deficits/detail   LLE Deficits / Details: able to  advance  Leg     Communication   Communication: No difficulties  Cognition Arousal/Alertness: Awake/alert Behavior During Therapy: WFL for tasks assessed/performed Overall Cognitive Status: Within Functional Limits for tasks assessed                      General Comments      Exercises        Assessment/Plan    PT Assessment Patient needs continued PT services  PT Diagnosis Difficulty walking;Acute pain   PT Problem List Decreased strength;Decreased range of motion;Decreased activity tolerance;Decreased mobility;Decreased knowledge of precautions;Decreased safety awareness;Decreased knowledge of use of DME;Pain  PT Treatment Interventions DME instruction;Gait training;Stair training;Functional mobility training;Therapeutic activities;Therapeutic exercise;Patient/family education   PT Goals (Current goals can be found in the Care Plan  section) Acute Rehab PT Goals Patient Stated Goal: get better PT Goal Formulation: With patient Time For Goal Achievement: 09/10/14 Potential to Achieve Goals: Good    Frequency 7X/week   Barriers to discharge        Co-evaluation               End of Session   Activity Tolerance: Patient tolerated treatment well Patient left: with call bell/phone within reach Nurse Communication: Mobility status         Time: [id number] PT Time Calculation (min) (ACUTE  ONLY): 12 min   Charges:   PT Evaluation $Initial PT Evaluation Tier I: 1 Procedure     PT G CodesRada Mcgrath:        Kendra Mcgrath Kendra Mcgrath 09/10/2014, 1:42 PM

## 2014-09-11 DIAGNOSIS — E663 Overweight: Secondary | ICD-10-CM | POA: Diagnosis present

## 2014-09-11 LAB — BASIC METABOLIC PANEL
Anion gap: 7 (ref 5–15)
BUN: 17 mg/dL (ref 6–20)
CALCIUM: 8.8 mg/dL — AB (ref 8.9–10.3)
CO2: 25 mmol/L (ref 22–32)
Chloride: 107 mmol/L (ref 101–111)
Creatinine, Ser: 0.78 mg/dL (ref 0.44–1.00)
GFR calc Af Amer: >60 mL/min (ref >60)
GFR calc non Af Amer: >60 mL/min (ref >60)
GLUCOSE: 110 mg/dL — AB (ref 65–99)
Potassium: 4.4 mmol/L (ref 3.5–5.1)
Sodium: 139 mmol/L (ref 135–145)

## 2014-09-11 LAB — CBC
HEMATOCRIT: 24.9 % — AB (ref 36.0–46.0)
Hemoglobin: 8 g/dL — ABNORMAL LOW (ref 12.0–15.0)
MCH: 26.9 pg (ref 26.0–34.0)
MCHC: 32.1 g/dL (ref 30.0–36.0)
MCV: 83.8 fL (ref 78.0–100.0)
Platelets: 188 10*3/uL (ref 150–400)
RBC: 2.97 MIL/uL — AB (ref 3.87–5.11)
RDW: 14.6 % (ref 11.5–15.5)
WBC: 12.5 10*3/uL — ABNORMAL HIGH (ref 4.0–10.5)

## 2014-09-11 NOTE — Progress Notes (Signed)
     Subjective: 2 Days Post-Op Procedure(s) (LRB): LEFT TOTAL POSTERIOR HIP REVISION (Left)   Patient reports pain as mild, pain controlled. No events throughout the night. Hgb was down to 8.0, we discussed that it should start trending upwards now.  She should continue taking iron 3 times a day and to make sure she stays well hydrated.  Ready to be discharged home.  Objective:   VITALS:   Filed Vitals:   09/11/14 0422  BP: 151/88  Pulse: 105  Temp: 97.6 F (36.4 C)  Resp: 16    Dorsiflexion/Plantar flexion intact Incision: dressing C/D/I No cellulitis present Compartment soft  LABS  Recent Labs  09/10/14 0533 09/11/14 0437  HGB 8.4* 8.0*  HCT 25.4* 24.9*  WBC 9.9 12.5*  PLT 187 188     Recent Labs  09/10/14 0533 09/11/14 0437  NA 137 139  K 4.6 4.4  BUN 16 17  CREATININE 0.67 0.78  GLUCOSE 137* 110*     Assessment/Plan: 2 Days Post-Op Procedure(s) (LRB): LEFT TOTAL POSTERIOR HIP REVISION (Left) Up with therapy Discharge home with home health  [facility]. Follow up with OLIN,Attallah Ontko D in 2 weeks.  Contact information:  [facility] [address], [address] [place] [id number] [phone]    Overweight (BMI 25-29.9) Estimated body mass index is 28.5 kg/(m^2) as calculated from the following:   Height as of this encounter: 5\' 7"  (1.702 m).   Weight as of this encounter: 82.555 kg (182 lb). Patient also counseled that weight may inhibit the healing process Patient counseled that losing weight will help with future health issues        Anastasio AuerbachMatthew S. Ashrita Chrismer   PAC  09/11/2014, 8:31 AM

## 2014-09-11 NOTE — Progress Notes (Signed)
Occupational Therapy Treatment Patient Details Name: Kendra Mcgrath MRN: [id number] DOB: 12/11/1950 Today's Date: 09/11/2014    History of present illness s/p L THR   OT comments  Pt continues to need cues for safety and THPs.  [facility]  Follow Up Recommendations  Home health OT;Supervision/Assistance - 24 hour    Equipment Recommendations  3 in 1 bedside comode    Recommendations for Other Services      Precautions / Restrictions Precautions Precautions: Posterior Hip;Fall Restrictions Weight Bearing Restrictions: No       Mobility Bed Mobility           Sit to supine: Supervision   General bed mobility comments: pt followed THPS this session  Transfers   Equipment used: Rolling walker (2 wheeled) Transfers: Sit to/from Stand Sit to Stand: Min guard         General transfer comment: cues for safety.  Pt extended leg without cues    Balance                                   ADL       Grooming: Supervision/safety;Standing                                 General ADL Comments: pt had completed ADL this am.  Reviewed precautions and use of reacher.   Pt needed cues for stand to sit:  did not get close enough to chair. Also needed cues coming out of bathroom for internal rotation.  Pt makes big movements and rotated pelvis over hips.  Needs reinforcement      Vision                     Perception     Praxis      Cognition   Behavior During Therapy: WFL for tasks assessed/performed Overall Cognitive Status: No family/caregiver present to determine baseline cognitive functioning (decreased safety; needs reinforcement with THPS)                       Extremity/Trunk Assessment               Exercises     Shoulder Instructions       General Comments      Pertinent Vitals/ Pain       Pain Score: 3  Pain Location: L hip Pain Descriptors / Indicators: Aching Pain  Intervention(s): Limited activity within patient's tolerance;Monitored during session;Premedicated before session;Repositioned;Ice applied  Home Living                                          Prior Functioning/Environment              Frequency Min 2X/week     Progress Toward Goals  OT Goals(current goals can now be found in the care plan section)  Progress towards OT goals: Progressing toward goals     Plan Discharge plan needs to be updated    Co-evaluation                 End of Session     Activity Tolerance Patient tolerated treatment well   Patient Left in bed;with call bell/phone within  reach   Nurse Communication          Time: [id number] OT Time Calculation (min): 10 min  Charges: OT General Charges $OT Visit: 1 Procedure OT Treatments $Self Care/Home Management : 8-22 mins  Allessandra Bernardi 09/11/2014, 10:16 AM  Kendra Mcgrath, OTR/L [phone] 09/11/2014

## 2014-09-11 NOTE — Progress Notes (Signed)
Discharge instructions given. Prescriptions for 4 meds given as well. No concerns voiced. Pt encouraged to call MD's office and schedule f/u appt as soon as possible. Voiced understanding. PIV discontinued. Pt is awaiting ride that should be arriving at 1100 to be transported home then. Asked pt to call nsg station when ride arrives. Vwilliams,rn.

## 2014-09-11 NOTE — Progress Notes (Signed)
Physical Therapy Treatment Patient Details Name: Shirl Harrisamela Kina MRN: [id number] DOB: 10/27/1950 Today's Date: 09/11/2014    History of Present Illness s/p L THR    PT Comments    Patient ready for DC  Follow Up Recommendations  Home health PT;Supervision/Assistance - 24 hour     Equipment Recommendations       Recommendations for Other Services       Precautions / Restrictions Precautions Precautions: Posterior Hip;Fall Restrictions Weight Bearing Restrictions: No    Mobility  Bed Mobility Overal bed mobility: Independent         Sit to supine: Supervision   General bed mobility comments: pt followed THPS this session  Transfers Overall transfer level: Modified independent Equipment used: Rolling walker (2 wheeled) Transfers: Sit to/from Stand Sit to Stand: Min guard         General transfer comment: cues for safety.  Pt extended leg without cues  Ambulation/Gait Ambulation/Gait assistance: Modified independent (Device/Increase time) Ambulation Distance (Feet): 100 Feet Assistive device: Rolling walker (2 wheeled) Gait Pattern/deviations: Step-to pattern;Step-through pattern     General Gait Details: cues for safety   Stairs            Wheelchair Mobility    Modified Rankin (Stroke Patients Only)       Balance                                    Cognition Arousal/Alertness: Awake/alert Behavior During Therapy: WFL for tasks assessed/performed Overall Cognitive Status: No family/caregiver present to determine baseline cognitive functioning (decreased safety; needs reinforcement with THPS)                      Exercises Total Joint Exercises Quad Sets: AROM;Both;10 reps;Supine Short Arc Quad: AROM;Right;10 reps;Supine Heel Slides: AROM;Right;10 reps;Supine Hip ABduction/ADduction: AROM;Right;10 reps;Supine    General Comments        Pertinent Vitals/Pain Pain Score: 2  Pain Location: L hip Pain  Descriptors / Indicators: Tightness Pain Intervention(s): Limited activity within patient's tolerance;Monitored during session;Premedicated before session;Repositioned;Ice applied    Home Living                      Prior Function            PT Goals (current goals can now be found in the care plan section) Progress towards PT goals: Progressing toward goals    Frequency       PT Plan Current plan remains appropriate    Co-evaluation             End of Session   Activity Tolerance: Patient tolerated treatment well Patient left:  (w/ OT)     Time: 6578-4696: 0905-0922 PT Time Calculation (min) (ACUTE ONLY): 17 min  Charges:  $Gait Training: 8-22 mins                    G Codes:      Rada HayHill, Amey Hossain Elizabeth 09/11/2014, 1:23 PM

## 2014-09-11 NOTE — Progress Notes (Signed)
Pt discharged to home. Left unit in wheelchair pushed by nurse tech. Left in good condition. Vwilliams,rn.

## 2014-09-18 NOTE — Discharge Summary (Signed)
Physician Discharge Summary  Patient ID: Kendra Mcgrath MRN: [id number] DOB/AGE: 65/05/1950 65 y.o.  Admit date: 09/09/2014 Discharge date: 09/11/2014   Procedures:  Procedure(s) (LRB): LEFT TOTAL POSTERIOR HIP REVISION (Left)  Attending Physician:  Dr. Durene RomansMatthew Olin   Admission Diagnoses:   Left hip pain S/P THA  Discharge Diagnoses:  Principal Problem:   S/P revision of left TH Active Problems:   Overweight (BMI 25.0-29.9)  Past Medical History  Diagnosis Date  . Anginal pain     ongoing for last 4-5 years   . Hypertension   . Pneumonia     hx of pneumonia   . Hypoglycemia   . Headache   . Anemia     hx of   . History of MRSA infection     2012 per patient on left ear     HPI:    Kendra Mcgrath, 64 y.o. 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 The indications for the revision total hip arthroplasty are bearing surface wear leading to symptomatic synovitis and implant or hip misalignment. Onset of symptoms was abrupt starting ~8 years ago with gradually worsening course since that time. Prior procedures on the left hip include arthroplasty per Dr. Charlann Boxerlin in November 2008. Patient currently rates pain in the left hip at 10 out of 10 with activity. There is night pain, worsening of pain with activity and weight bearing, trendelenberg gait, pain that interfers with activities of daily living, pain with passive range of motion and crepitus. Patient has evidence of previous THA by imaging studies. This condition presents safety issues increasing the risk of falls. There is no current active infection. Risks, benefits and expectations were discussed with the patient. Risks including but not limited to the risk of anesthesia, blood clots, nerve damage, blood vessel damage, failure of the prosthesis, infection and up to and including death.  Patient understand the risks, benefits and expectations and wishes to proceed with surgery.   PCP: Sissy HoffSWAYNE,DAVID W, MD   Discharged Condition: good  Hospital Course:  Patient underwent the above stated procedure on 09/09/2014. Patient tolerated the procedure well and brought to the recovery room in good condition and subsequently to the floor.  POD #1 BP: 118/70 ; Pulse: 93 ; Temp: 97.5 F (36.4 C) ; Resp: 14 Patient reports pain as mild. More an issue with nausea last night, otherwise no events. Dorsiflexion/plantar flexion intact, incision: dressing C/D/I, no cellulitis present and compartment soft.   LABS  Basename    HGB  8.4  HCT  25.4   POD #2  BP: 151/88 ; Pulse: 105 ; Temp: 97.6 F (36.4 C) ; Resp: 16 Patient reports pain as mild, pain controlled. No events throughout the night. Hgb was down to 8.0, we discussed that it should start trending upwards now. She should continue taking iron 3 times a day and to make sure she stays well hydrated. Ready to be discharged home. Dorsiflexion/plantar flexion intact, incision: dressing C/D/I, no cellulitis present and compartment soft.   LABS  Basename    HGB  8.0  HCT  24.9    Discharge Exam: General appearance: alert, cooperative and no distress Extremities: Homans sign is negative, no sign of DVT, no edema, redness or tenderness in the calves or thighs and no ulcers, gangrene or trophic changes  Disposition: Home with follow up in 2 weeks  Follow-up Information    Follow up with Shelda Pal, MD. Schedule an appointment as soon as possible for a visit in 2 weeks.   Specialty:  Orthopedic Surgery   Contact information:   [address] [place] [address] [phone]       Follow up with [facility].   Why:  home health physical therapy   Contact information:   [address] [place] [address] [phone]       Follow up with [facility].   Why:   rolling walker and 3n1 (commode)   Contact information:   [address] [place] [address] [phone]       Discharge Instructions    Call MD / Call 911    Complete by:  As directed   If you experience chest pain or shortness of breath, CALL 911 and be transported to the hospital emergency room.  If you develope a fever above 101 F, pus (white drainage) or increased drainage or redness at the wound, or calf pain, call your surgeon's office.     Change dressing    Complete by:  As directed   Maintain surgical dressing until follow up in the clinic. If the edges start to pull up, may reinforce with tape. If the dressing is no longer working, may remove and cover with gauze and tape, but must keep the area dry and clean.  Call with any questions or concerns.     Constipation Prevention    Complete by:  As directed   Drink plenty of fluids.  Prune juice may be helpful.  You may use a stool softener, such as Colace (over the counter) 100 mg twice a day.  Use MiraLax (over the counter) for constipation as needed.     Diet - low sodium heart healthy    Complete by:  As directed      Discharge instructions    Complete by:  As directed   Maintain surgical dressing until follow up in the clinic. If the edges start to pull up, may reinforce with tape. If the dressing is no longer working, may remove and cover with gauze and tape, but must keep the area dry and clean.  Follow up in 2 weeks at [facility]. Call with any questions or concerns.     Increase activity slowly as tolerated    Complete by:  As directed      TED hose    Complete by:  As directed   Use stockings (TED hose) for 2 weeks on both leg(s).  You may remove them at night for sleeping.     Weight bearing as tolerated    Complete by:  As directed   Laterality:  left  Extremity:  Lower             Medication List    STOP taking these medications        naproxen sodium 220 MG tablet  Commonly known  as:  ANAPROX     traMADol 50 MG tablet  Commonly known as:  ULTRAM      TAKE these medications        aspirin 325 MG EC tablet  Take 1 tablet (325 mg total) by mouth 2 (two) times daily.     benazepril 20 MG tablet  Commonly known as:  LOTENSIN  Take 20 mg by mouth every morning.     docusate sodium 100 MG capsule  Commonly known as:  COLACE  Take 1 capsule (100 mg total) by mouth 2 (two) times daily.     ferrous sulfate 325 (65 FE) MG tablet  Take 1 tablet (325 mg total) by mouth 3 (three) times daily after meals.     HYDROcodone-acetaminophen 7.5-325 MG per tablet  Commonly known as:  NORCO  Take 1-2 tablets by mouth every 4 (four) hours as needed for moderate pain.     methocarbamol 500 MG tablet  Commonly known as:  ROBAXIN  Take 1 tablet (500 mg total) by mouth every 6 (six) hours as needed for muscle spasms.     metoprolol succinate 50 MG 24 hr tablet  Commonly known as:  TOPROL-XL  Take 50 mg by mouth every morning. Take with or immediately following a meal.     OVER THE COUNTER MEDICATION  Take 1 tablet by mouth daily.     polyethylene glycol packet  Commonly known as:  MIRALAX / GLYCOLAX  Take 17 g by mouth 2 (two) times daily.     promethazine 12.5 MG tablet  Commonly known as:  PHENERGAN  Take 1 tablet (12.5 mg total) by mouth every 6 (six) hours as needed for nausea or vomiting.     vitamin B-12 100 MCG tablet  Commonly known as:  CYANOCOBALAMIN  Take 100 mcg by mouth daily.         Signed: Anastasio Auerbach. Adalida Garver   PA-C  09/18/2014, 8:16 AM

## 2014-09-24 DIAGNOSIS — Z471 Aftercare following joint replacement surgery: Secondary | ICD-10-CM | POA: Diagnosis not present

## 2014-09-24 DIAGNOSIS — Z96642 Presence of left artificial hip joint: Secondary | ICD-10-CM | POA: Diagnosis not present

## 2014-09-27 ENCOUNTER — Emergency Department (HOSPITAL_COMMUNITY): Payer: Medicare Other

## 2014-09-27 ENCOUNTER — Encounter (HOSPITAL_COMMUNITY): Payer: Self-pay | Admitting: Emergency Medicine

## 2014-09-27 ENCOUNTER — Emergency Department (HOSPITAL_COMMUNITY)
Admission: EM | Admit: 2014-09-27 | Discharge: 2014-09-27 | Disposition: A | Discharge status: Home / Self Care | Payer: Medicare Other | Attending: Emergency Medicine | Admitting: Emergency Medicine

## 2014-09-27 DIAGNOSIS — X58XXXA Exposure to other specified factors, initial encounter: Secondary | ICD-10-CM | POA: Insufficient documentation

## 2014-09-27 DIAGNOSIS — T84029A Dislocation of unspecified internal joint prosthesis, initial encounter: Secondary | ICD-10-CM

## 2014-09-27 DIAGNOSIS — Z8614 Personal history of Methicillin resistant Staphylococcus aureus infection: Secondary | ICD-10-CM | POA: Diagnosis not present

## 2014-09-27 DIAGNOSIS — M25552 Pain in left hip: Secondary | ICD-10-CM | POA: Diagnosis not present

## 2014-09-27 DIAGNOSIS — Z8639 Personal history of other endocrine, nutritional and metabolic disease: Secondary | ICD-10-CM | POA: Insufficient documentation

## 2014-09-27 DIAGNOSIS — Z880 Allergy status to penicillin: Secondary | ICD-10-CM | POA: Insufficient documentation

## 2014-09-27 DIAGNOSIS — Z471 Aftercare following joint replacement surgery: Secondary | ICD-10-CM | POA: Diagnosis not present

## 2014-09-27 DIAGNOSIS — Z96649 Presence of unspecified artificial hip joint: Secondary | ICD-10-CM

## 2014-09-27 DIAGNOSIS — Z720 Tobacco use: Secondary | ICD-10-CM | POA: Insufficient documentation

## 2014-09-27 DIAGNOSIS — S79912A Unspecified injury of left hip, initial encounter: Secondary | ICD-10-CM | POA: Diagnosis present

## 2014-09-27 DIAGNOSIS — T84021A Dislocation of internal left hip prosthesis, initial encounter: Secondary | ICD-10-CM | POA: Diagnosis not present

## 2014-09-27 DIAGNOSIS — D649 Anemia, unspecified: Secondary | ICD-10-CM | POA: Insufficient documentation

## 2014-09-27 DIAGNOSIS — Y998 Other external cause status: Secondary | ICD-10-CM | POA: Diagnosis not present

## 2014-09-27 DIAGNOSIS — Y9289 Other specified places as the place of occurrence of the external cause: Secondary | ICD-10-CM | POA: Diagnosis not present

## 2014-09-27 DIAGNOSIS — Y9389 Activity, other specified: Secondary | ICD-10-CM | POA: Diagnosis not present

## 2014-09-27 DIAGNOSIS — S73005A Unspecified dislocation of left hip, initial encounter: Secondary | ICD-10-CM

## 2014-09-27 DIAGNOSIS — Z7982 Long term (current) use of aspirin: Secondary | ICD-10-CM | POA: Diagnosis not present

## 2014-09-27 DIAGNOSIS — R52 Pain, unspecified: Secondary | ICD-10-CM | POA: Diagnosis not present

## 2014-09-27 DIAGNOSIS — W19XXXA Unspecified fall, initial encounter: Secondary | ICD-10-CM

## 2014-09-27 DIAGNOSIS — Z79899 Other long term (current) drug therapy: Secondary | ICD-10-CM | POA: Insufficient documentation

## 2014-09-27 DIAGNOSIS — I10 Essential (primary) hypertension: Secondary | ICD-10-CM | POA: Diagnosis not present

## 2014-09-27 DIAGNOSIS — Z8701 Personal history of pneumonia (recurrent): Secondary | ICD-10-CM | POA: Diagnosis not present

## 2014-09-27 DIAGNOSIS — Z96642 Presence of left artificial hip joint: Secondary | ICD-10-CM | POA: Diagnosis not present

## 2014-09-27 MED ORDER — PROPOFOL 10 MG/ML IV BOLUS
41.3000 mg | Freq: Once | INTRAVENOUS | Status: AC
  Administered 2014-09-27: 41.3 mg via INTRAVENOUS
  Filled 2014-09-27: qty 1

## 2014-09-27 MED ORDER — OXYCODONE-ACETAMINOPHEN 5-325 MG PO TABS
1.0000 | ORAL_TABLET | Freq: Once | ORAL | Status: AC
  Administered 2014-09-27: 1 via ORAL
  Filled 2014-09-27: qty 1

## 2014-09-27 MED ORDER — FENTANYL CITRATE (PF) 100 MCG/2ML IJ SOLN
100.0000 ug | Freq: Once | INTRAMUSCULAR | Status: AC
  Administered 2014-09-27: 100 ug via INTRAVENOUS
  Filled 2014-09-27 (×2): qty 2

## 2014-09-27 MED ORDER — FENTANYL CITRATE (PF) 100 MCG/2ML IJ SOLN: 100.0000 ug | Freq: Once | INTRAMUSCULAR | Status: DC

## 2014-09-27 MED ORDER — OXYCODONE-ACETAMINOPHEN 5-325 MG PO TABS: 1.0000 | ORAL_TABLET | Freq: Four times a day (QID) | ORAL | Status: DC | PRN

## 2014-09-27 NOTE — Discharge Instructions (Signed)
Closed Reduction for Artificial Hip Dislocation °The dislocation of an artificial hip is when the ball of the hip has slipped out of its socket. Many of these dislocations can be treated by simply putting the ball back in the socket (closed reduction). This can often be done with a set of mechanical parts that move bones (traction) and moving the leg around (manipulation). When the artificial hip is put back into place with manipulation, a brace or cast may be needed. Your caregiver will decide if a brace or cast is necessary. Your caregiver will decide the amount of time required for this. A brace or cast will help you heal completely so the hip will be less likely to dislocate again. °When the hip cannot be relocated with manipulation, surgery may be needed.  °LET YOUR CAREGIVER KNOW ABOUT: °· Allergies to food or medicine. °· Medicines taken, including vitamins, herbs, eyedrops, over-the-counter medicines, and creams. °· Use of steroids (by mouth or creams). °· Previous problems with anesthetics or numbing medicines. °· History of bleeding problems or blood clots. °· Previous surgery. °· Other health problems, including diabetes and kidney problems. °· Possibility of pregnancy, if this applies. °BEFORE THE PROCEDURE °Before your hip is put back in place, an intravenous (IV) access may be started. You may be given an anesthetic in your back to make you numb from the waist down (spinal anesthetic).  °AFTER THE PROCEDURE °You will be taken to the recovery area. A nurse will monitor your progress. You will be returned to your room when you are awake and without problems. You will receive physical therapy until you are moving around by yourself and your caregiver feels it is safe for you to be released. °MAKE SURE YOU:  °· Understand these instructions. °· Will watch your condition. °· Will get help right away if you are not doing well or get worse. °Document Released: 01/05/2001 Document Revised: 07/05/2011 Document  Reviewed: 04/15/2008 °[employer]® Patient Information ©2015 [employer]. This information is not intended to replace advice given to you by your health care provider. Make sure you discuss any questions you have with your health care provider. ° °

## 2014-09-27 NOTE — ED Notes (Signed)
Per EMS, patient from home, recent hip surgery, patient was putting on her shoe when it "popped". Surgery was on 09/09/14, denies falls or trauma. Patient received 250mcf Fentanyl en route. Pain initial 10/10, now 7/10.

## 2014-09-27 NOTE — ED Provider Notes (Signed)
CSN: [id number]     Arrival date & time 09/27/14  1011 History   First MD Initiated Contact with Patient 09/27/14 1110     Chief Complaint  Patient presents with  . Hip Pain    left     (Consider location/radiation/quality/duration/timing/severity/associated sxs/prior Treatment) Patient is a 64 y.o. female presenting with hip pain. The history is provided by the patient.  Hip Pain This is a recurrent problem. Pertinent negatives include no chest pain, no abdominal pain, no headaches and no shortness of breath.   patient had a redo hip replacement of the left hip around 2 weeks ago. Today she was sitting in a chair and bent over and felt a pop and has had severe pain. She states she dislocated the hip. No other injury. She has previously dislocated hip. She has not eaten much today.  Past Medical History  Diagnosis Date  . Anginal pain     ongoing for last 4-5 years   . Hypertension   . Pneumonia     hx of pneumonia   . Hypoglycemia   . Headache   . Anemia     hx of   . History of MRSA infection     2012 per patient on left ear    Past Surgical History  Procedure Laterality Date  . Tonsillectomy    . Joint replacement      left hip  . Left hip pinning     . Anterior hip revision Left 09/09/2014    Procedure: LEFT TOTAL POSTERIOR HIP REVISION;  Surgeon: Durene Romans, MD;  Location: [facility];  Service: Orthopedics;  Laterality: Left;   History reviewed. No pertinent family history. History  Substance Use Topics  . Smoking status: Current Every Day Smoker -- 1.00 packs/day for 50 years    Types: Cigarettes  . Smokeless tobacco: Never Used  . Alcohol Use: Yes     Comment: rare   OB History    No data available     Review of Systems  Constitutional: Negative for activity change and appetite change.  Eyes: Negative for pain.  Respiratory: Negative for chest tightness and shortness of breath.   Cardiovascular: Negative for chest pain.  Gastrointestinal: Negative for  abdominal pain.  Genitourinary: Negative for flank pain.  Musculoskeletal: Negative for back pain and neck stiffness.       Left hip pain  Skin: Negative for rash.  Neurological: Negative for weakness and headaches.      Allergies  Penicillins and Tetanus toxoids  Home Medications   Prior to Admission medications   Medication Sig Start Date End Date Taking? Authorizing Provider  aspirin EC 325 MG EC tablet Take 1 tablet (325 mg total) by mouth 2 (two) times daily. 09/10/14 10/08/14 Yes Matthew Babish, PA-C  atorvastatin (LIPITOR) 40 MG tablet Take 40 mg by mouth daily. 08/30/14  Yes Historical Provider, MD  benazepril (LOTENSIN) 20 MG tablet Take 20 mg by mouth every morning.   Yes Historical Provider, MD  diphenhydrAMINE (SOMINEX) 25 MG tablet Take 50 mg by mouth at bedtime as needed for itching, allergies or sleep.   Yes Historical Provider, MD  ferrous sulfate 325 (65 FE) MG tablet Take 1 tablet (325 mg total) by mouth 3 (three) times daily after meals. 09/10/14  Yes Lanney Gins, PA-C  HYDROcodone-acetaminophen (NORCO) 7.5-325 MG per tablet Take 1-2 tablets by mouth every 4 (four) hours as needed for moderate pain. 09/10/14  Yes Matthew Babish, PA-C  methocarbamol (ROBAXIN) 500 MG  tablet Take 1 tablet (500 mg total) by mouth every 6 (six) hours as needed for muscle spasms. 09/10/14  Yes Lanney Gins, PA-C  metoprolol succinate (TOPROL-XL) 50 MG 24 hr tablet Take 50 mg by mouth every morning. Take with or immediately following a meal.   Yes Historical Provider, MD  OVER THE COUNTER MEDICATION Take 1 tablet by mouth daily.   Yes Historical Provider, MD  traMADol (ULTRAM) 50 MG tablet Take 50-100 mg by mouth at bedtime as needed for moderate pain.  09/03/14  Yes Historical Provider, MD  docusate sodium (COLACE) 100 MG capsule Take 1 capsule (100 mg total) by mouth 2 (two) times daily. Patient not taking: Reported on 09/27/2014 09/10/14   Lanney Gins, PA-C  oxyCODONE-acetaminophen  (PERCOCET/ROXICET) 5-325 MG per tablet Take 1-2 tablets by mouth every 6 (six) hours as needed for severe pain. 09/27/14   Benjiman Core, MD  polyethylene glycol [facility] / GLYCOLAX) packet Take 17 g by mouth 2 (two) times daily. Patient not taking: Reported on 09/27/2014 09/10/14   Lanney Gins, PA-C  promethazine (PHENERGAN) 12.5 MG tablet Take 1 tablet (12.5 mg total) by mouth every 6 (six) hours as needed for nausea or vomiting. Patient not taking: Reported on 09/27/2014 09/10/14   Lanney Gins, PA-C   BP 134/74 mmHg  Pulse 77  Temp(Src) 97.1 F (36.2 C) (Oral)  Resp 24  SpO2 97% Physical Exam  Constitutional: She appears well-developed.  HENT:  Head: Atraumatic.  Eyes: Pupils are equal, round, and reactive to light.  Cardiovascular: Normal rate.   Pulmonary/Chest: Effort normal.  Musculoskeletal: She exhibits tenderness.  Tenderness to left hip with some deformity and shortening of the leg. Some external rotation.  Neurological: She is alert.  Skin: Skin is warm.    ED Course  ORTHOPEDIC INJURY TREATMENT Date/Time: 09/27/2014 12:00 PM Performed by: Benjiman Core Authorized by: Benjiman Core Consent: Verbal consent obtained. Written consent not obtained. Risks and benefits: risks, benefits and alternatives were discussed Consent given by: patient Patient understanding: patient states understanding of the procedure being performed Relevant documents: relevant documents present and verified Imaging studies: imaging studies available Required items: required blood products, implants, devices, and special equipment available Patient identity confirmed: verbally with patient and arm band Time out: Immediately prior to procedure a "time out" was called to verify the correct patient, procedure, equipment, support staff and site/side marked as required. Injury location: hip Location details: left hip Injury type: dislocation Dislocation type: posterior Spontaneous  dislocation: yes Prosthesis: yes Pre-procedure neurovascular assessment: neurovascularly intact Pre-procedure distal perfusion: normal Pre-procedure neurological function: normal Pre-procedure range of motion: reduced Local anesthesia used: no Patient sedated: yes Sedation type: moderate (conscious) sedation Sedatives: propofol Analgesia: fentanyl Vitals: Vital signs were monitored during sedation. (See nursing documentation for procedure times. Total sedation time of around 10 minutes) Manipulation performed: yes Reduction method: traction and counter traction Reduction successful: yes X-ray confirmed reduction: yes Post-procedure neurovascular assessment: post-procedure neurovascularly intact Post-procedure distal perfusion: normal Post-procedure neurological function: normal Post-procedure range of motion: improved Patient tolerance: Patient tolerated the procedure well with no immediate complications   (including critical care time) Labs Review Labs Reviewed - No data to display  Imaging Review Dg Hip Port Unilat With Pelvis 1v Left  09/27/2014   CLINICAL DATA:  Post left total hip replacement reduction  EXAM: LEFT HIP (WITH PELVIS) 1 VIEW PORTABLE  COMPARISON:  09/27/2014  FINDINGS: Interval reduction of previously noted left total hip dislocation. No definite evidence of hardware failure or loosening.  No definite evidence of fracture on this solitary provided AP projection radiograph. Expected adjacent soft tissue swelling. No radiopaque foreign body.  IMPRESSION: Interval reduction of previously noted dislocated left total hip replacement without evidence of complication.   Electronically Signed   By: Simonne ComeJohn  Watts M.D.   On: 09/27/2014 13:10   Dg Hip Unilat With Pelvis 2-3 Views Left  09/27/2014   CLINICAL DATA:  Pain after bending  EXAM: LEFT HIP (WITH PELVIS) 2-3 VIEWS  COMPARISON:  Sep 09, 2014  FINDINGS: Frontal pelvis as well as frontal and lateral left hip images obtained.  There is a total hip prosthesis on the left. There is dislocation at the prosthesis site with the femoral component displaced superiorly, laterally, and posteriorly with respect to the acetabular component. No fracture. The right hip joint appears normal. There is mild degenerative change in the pubic symphysis, stable.  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 No acute fracture.   Electronically Signed   By: Bretta BangWilliam  Woodruff III M.D.   On: 09/27/2014 11:49     EKG Interpretation None      MDM   Final diagnoses:  Fall  Hip dislocation, left, initial encounter  Dislocation of hip prosthesis, initial encounter    Patient with dislocation of prosthetic hip. Discussed with Dr. Charlann Boxerlin and I reduce the hip in the ER. Discharged home.      Benjiman CoreNathan Torrence Branagan, MD 09/27/14 [phone]

## 2014-10-08 DIAGNOSIS — I10 Essential (primary) hypertension: Secondary | ICD-10-CM | POA: Diagnosis not present

## 2014-10-08 DIAGNOSIS — R0789 Other chest pain: Secondary | ICD-10-CM | POA: Diagnosis not present

## 2014-10-10 DIAGNOSIS — I739 Peripheral vascular disease, unspecified: Secondary | ICD-10-CM | POA: Diagnosis not present

## 2014-10-23 DIAGNOSIS — Z471 Aftercare following joint replacement surgery: Secondary | ICD-10-CM | POA: Diagnosis not present

## 2014-10-23 DIAGNOSIS — Z96642 Presence of left artificial hip joint: Secondary | ICD-10-CM | POA: Diagnosis not present

## 2014-10-24 DIAGNOSIS — Z720 Tobacco use: Secondary | ICD-10-CM | POA: Diagnosis not present

## 2014-10-24 DIAGNOSIS — I10 Essential (primary) hypertension: Secondary | ICD-10-CM | POA: Diagnosis not present

## 2014-10-24 DIAGNOSIS — I739 Peripheral vascular disease, unspecified: Secondary | ICD-10-CM | POA: Diagnosis not present

## 2014-10-24 DIAGNOSIS — R079 Chest pain, unspecified: Secondary | ICD-10-CM | POA: Diagnosis not present

## 2014-11-21 DIAGNOSIS — R0989 Other specified symptoms and signs involving the circulatory and respiratory systems: Secondary | ICD-10-CM | POA: Diagnosis not present

## 2014-11-26 ENCOUNTER — Encounter (HOSPITAL_COMMUNITY): Payer: Self-pay | Admitting: Emergency Medicine

## 2014-11-26 ENCOUNTER — Emergency Department (HOSPITAL_COMMUNITY): Payer: Medicare Other

## 2014-11-26 ENCOUNTER — Emergency Department (HOSPITAL_COMMUNITY)
Admission: EM | Admit: 2014-11-26 | Discharge: 2014-11-26 | Disposition: A | Discharge status: Home / Self Care | Payer: Medicare Other | Attending: Physician Assistant | Admitting: Physician Assistant

## 2014-11-26 DIAGNOSIS — I209 Angina pectoris, unspecified: Secondary | ICD-10-CM | POA: Insufficient documentation

## 2014-11-26 DIAGNOSIS — T84021A Dislocation of internal left hip prosthesis, initial encounter: Secondary | ICD-10-CM | POA: Diagnosis not present

## 2014-11-26 DIAGNOSIS — Y939 Activity, unspecified: Secondary | ICD-10-CM | POA: Insufficient documentation

## 2014-11-26 DIAGNOSIS — Z8614 Personal history of Methicillin resistant Staphylococcus aureus infection: Secondary | ICD-10-CM | POA: Insufficient documentation

## 2014-11-26 DIAGNOSIS — I10 Essential (primary) hypertension: Secondary | ICD-10-CM | POA: Diagnosis not present

## 2014-11-26 DIAGNOSIS — W1839XA Other fall on same level, initial encounter: Secondary | ICD-10-CM | POA: Insufficient documentation

## 2014-11-26 DIAGNOSIS — Z862 Personal history of diseases of the blood and blood-forming organs and certain disorders involving the immune mechanism: Secondary | ICD-10-CM | POA: Diagnosis not present

## 2014-11-26 DIAGNOSIS — T148 Other injury of unspecified body region: Secondary | ICD-10-CM | POA: Diagnosis not present

## 2014-11-26 DIAGNOSIS — Z79899 Other long term (current) drug therapy: Secondary | ICD-10-CM | POA: Diagnosis not present

## 2014-11-26 DIAGNOSIS — Y929 Unspecified place or not applicable: Secondary | ICD-10-CM | POA: Insufficient documentation

## 2014-11-26 DIAGNOSIS — Z8701 Personal history of pneumonia (recurrent): Secondary | ICD-10-CM | POA: Diagnosis not present

## 2014-11-26 DIAGNOSIS — Z7982 Long term (current) use of aspirin: Secondary | ICD-10-CM | POA: Insufficient documentation

## 2014-11-26 DIAGNOSIS — M25552 Pain in left hip: Secondary | ICD-10-CM | POA: Diagnosis not present

## 2014-11-26 DIAGNOSIS — Z880 Allergy status to penicillin: Secondary | ICD-10-CM | POA: Diagnosis not present

## 2014-11-26 DIAGNOSIS — S73005A Unspecified dislocation of left hip, initial encounter: Secondary | ICD-10-CM

## 2014-11-26 DIAGNOSIS — Y999 Unspecified external cause status: Secondary | ICD-10-CM | POA: Diagnosis not present

## 2014-11-26 DIAGNOSIS — Z720 Tobacco use: Secondary | ICD-10-CM | POA: Insufficient documentation

## 2014-11-26 DIAGNOSIS — Z96642 Presence of left artificial hip joint: Secondary | ICD-10-CM | POA: Diagnosis not present

## 2014-11-26 DIAGNOSIS — S79912A Unspecified injury of left hip, initial encounter: Secondary | ICD-10-CM | POA: Diagnosis present

## 2014-11-26 MED ORDER — FENTANYL CITRATE (PF) 100 MCG/2ML IJ SOLN
100.0000 ug | Freq: Once | INTRAMUSCULAR | Status: AC
  Administered 2014-11-26: 100 ug via INTRAVENOUS
  Filled 2014-11-26: qty 2

## 2014-11-26 MED ORDER — OXYCODONE-ACETAMINOPHEN 5-325 MG PO TABS
1.0000 | ORAL_TABLET | ORAL | Status: DC | PRN
Start: 2014-11-26 — End: 2016-10-28

## 2014-11-26 MED ORDER — KETAMINE HCL 10 MG/ML IJ SOLN
40.0000 mg | Freq: Once | INTRAMUSCULAR | Status: AC
  Administered 2014-11-26: 40 mg via INTRAVENOUS

## 2014-11-26 MED ORDER — PROPOFOL 10 MG/ML IV BOLUS
80.0000 mg | Freq: Once | INTRAVENOUS | Status: AC
  Administered 2014-11-26: 40 mg via INTRAVENOUS
  Filled 2014-11-26: qty 1
  Filled 2014-11-26: qty 20

## 2014-11-26 MED ORDER — OXYCODONE-ACETAMINOPHEN 5-325 MG PO TABS
1.0000 | ORAL_TABLET | Freq: Once | ORAL | Status: AC
  Administered 2014-11-26: 1 via ORAL
  Filled 2014-11-26: qty 1

## 2014-11-26 NOTE — Discharge Instructions (Signed)
Hip Dislocation °A hip dislocation happens when your thigh bone (the ball) separates from your hip bone (the socket). Hip dislocation is an emergency. If you think you have a hip dislocation and cannot move your leg, get help right away. Do not try to move.  °Your doctor will put your thigh bone back in the joint (the ball back in the socket). Sometimes this can be done without surgery. Surgery may be needed if blood vessels or nerves are damaged, or if the ball cannot be put back into the socket by hand. °HOME CARE °· Rest your injured joint. Do not move it. °· Avoid the activity that caused your injury. °· Put ice on your injured joint for 1 to 2 days or as told by your doctor. °¨ Put ice in a plastic bag. °¨ Place a towel between your skin and the bag. °¨ Leave the ice on for 15 to 20 minutes, every 2 hours while you are awake. °· Use crutches or a walker as told by your doctor. °· Exercise your hip and leg as told by your doctor. °· Only take medicines as told by your doctor. °GET HELP RIGHT AWAY IF: °· Your pain gets worse, not better. °· You feel like your hip has dislocated again. °MAKE SURE YOU: °· Understand these instructions. °· Will watch your condition. °· Will get help right away if you are not doing well or get worse. °Document Released: 12/09/2010 Document Revised: 07/05/2011 Document Reviewed: 12/09/2010 °[employer]® Patient Information ©2015 [employer]. This information is not intended to replace advice given to you by your health care provider. Make sure you discuss any questions you have with your health care provider. ° °

## 2014-11-26 NOTE — Sedation Documentation (Signed)
MD at bedside. 

## 2014-11-26 NOTE — Progress Notes (Signed)
CSW met with patient at bedside. There was no family present. Patient confirms that she presents to [facility] due to fall. Patient stated that her hip went out and that is what caused her to fall.   Patient states that she does not fall often. She informed CSW that this incident is her first time falling in 6 months. Also, patient states that she completes her ADL's independently.   Patient informed CSW that she feels safe to return home. Patient states that she is not interested in facility.   Patient informed CSW that she lives at home with husband in [place].  Willette Brace [phone] ED CSW 11/26/2014 4:31 PM

## 2014-11-26 NOTE — ED Provider Notes (Addendum)
CSN: [id number]     Arrival date & time 11/26/14  1234 History   First MD Initiated Contact with Patient 11/26/14 1323     Chief Complaint  Patient presents with  . Hip Pain  . Fall     (Consider location/radiation/quality/duration/timing/severity/associated sxs/prior Treatment) Patient is a 64 y.o. female presenting with hip pain and fall.  Hip Pain Pertinent negatives include no chest pain.  Fall Pertinent negatives include no chest pain.   patient is a 64 year old female who had a mechanical fall today and presents with left hip pain. Patient has had recent revision of her left hip done in May. Has had one dislocation since then. Patient has pain to her left hip. Unable to take x-ray until her pain is well-controlled.  Past Medical History  Diagnosis Date  . Anginal pain     ongoing for last 4-5 years   . Hypertension   . Pneumonia     hx of pneumonia   . Hypoglycemia   . Headache   . Anemia     hx of   . History of MRSA infection     2012 per patient on left ear    Past Surgical History  Procedure Laterality Date  . Tonsillectomy    . Joint replacement      left hip  . Left hip pinning     . Anterior hip revision Left 09/09/2014    Procedure: LEFT TOTAL POSTERIOR HIP REVISION;  Surgeon: Durene Romans, MD;  Location: [facility];  Service: Orthopedics;  Laterality: Left;   History reviewed. No pertinent family history. History  Substance Use Topics  . Smoking status: Current Every Day Smoker -- 1.00 packs/day for 50 years    Types: Cigarettes  . Smokeless tobacco: Never Used  . Alcohol Use: Yes     Comment: rare   OB History    No data available     Review of Systems  Constitutional: Negative for activity change and fatigue.  HENT: Negative for congestion and drooling.   Eyes: Negative for discharge.  Respiratory: Negative for cough and chest tightness.   Cardiovascular: Negative for chest pain.  Gastrointestinal: Negative for abdominal distention.   Genitourinary: Negative for dysuria and difficulty urinating.  Musculoskeletal: Positive for joint swelling.  Skin: Negative for rash.  Allergic/Immunologic: Negative for immunocompromised state.  Neurological: Negative for seizures and speech difficulty.  Psychiatric/Behavioral: Negative for behavioral problems and agitation.      Allergies  Penicillins and Tetanus toxoids  Home Medications   Prior to Admission medications   Medication Sig Start Date End Date Taking? Authorizing Provider  aspirin 325 MG tablet Take 325 mg by mouth 2 (two) times daily.   Yes Historical Provider, MD  atorvastatin (LIPITOR) 40 MG tablet Take 40 mg by mouth daily. 08/30/14  Yes Historical Provider, MD  benazepril (LOTENSIN) 20 MG tablet Take 20 mg by mouth every morning.   Yes Historical Provider, MD  cetirizine (ZYRTEC) 10 MG tablet Take 10 mg by mouth at bedtime.   Yes Historical Provider, MD  diphenhydrAMINE (SOMINEX) 25 MG tablet Take 50 mg by mouth at bedtime as needed for itching, allergies or sleep.   Yes Historical Provider, MD  ferrous sulfate 325 (65 FE) MG tablet Take 1 tablet (325 mg total) by mouth 3 (three) times daily after meals. Patient taking differently: Take 325 mg by mouth daily.  09/10/14  Yes Lanney Gins, PA-C  HYDROcodone-acetaminophen (NORCO) 7.5-325 MG per tablet Take 1-2 tablets by mouth every  4 (four) hours as needed for moderate pain. 09/10/14  Yes Lanney Gins, PA-C  ibuprofen (ADVIL,MOTRIN) 200 MG tablet Take 200 mg by mouth every 6 (six) hours as needed.   Yes Historical Provider, MD  methocarbamol (ROBAXIN) 500 MG tablet Take 1 tablet (500 mg total) by mouth every 6 (six) hours as needed for muscle spasms. 09/10/14  Yes Lanney Gins, PA-C  metoprolol succinate (TOPROL-XL) 50 MG 24 hr tablet Take 50 mg by mouth every morning. Take with or immediately following a meal.   Yes Historical Provider, MD  promethazine (PHENERGAN) 12.5 MG tablet Take 1 tablet (12.5 mg total) by  mouth every 6 (six) hours as needed for nausea or vomiting. 09/10/14  Yes Lanney Gins, PA-C  traMADol (ULTRAM) 50 MG tablet Take 50-100 mg by mouth at bedtime as needed for moderate pain.  09/03/14  Yes Historical Provider, MD  docusate sodium (COLACE) 100 MG capsule Take 1 capsule (100 mg total) by mouth 2 (two) times daily. Patient not taking: Reported on 09/27/2014 09/10/14   Lanney Gins, PA-C  oxyCODONE-acetaminophen (PERCOCET/ROXICET) 5-325 MG per tablet Take 1-2 tablets by mouth every 6 (six) hours as needed for severe pain. Patient not taking: Reported on 11/26/2014 09/27/14   Benjiman Core, MD  polyethylene glycol [facility] / Ethelene Hal) packet Take 17 g by mouth 2 (two) times daily. Patient not taking: Reported on 09/27/2014 09/10/14   Lanney Gins, PA-C   BP 135/67 mmHg  Pulse 78  Temp(Src) 97.5 F (36.4 C) (Oral)  Resp 18  SpO2 94% Physical Exam  Constitutional: She is oriented to person, place, and time. She appears well-developed and well-nourished.  HENT:  Head: Normocephalic and atraumatic.  Eyes: Conjunctivae are normal. Right eye exhibits no discharge.  Neck: Neck supple.  Cardiovascular: Normal rate, regular rhythm and normal heart sounds.   No murmur heard. Pulmonary/Chest: Effort normal and breath sounds normal. She has no wheezes. She has no rales.  Abdominal: Soft. She exhibits no distension. There is no tenderness.  Musculoskeletal: Normal range of motion. She exhibits no edema.  Left hip with abnormal anatomy. Positive pulses left DP  Neurological: She is oriented to person, place, and time. No cranial nerve deficit.  Skin: Skin is warm and dry. No rash noted. She is not diaphoretic.  Psychiatric: She has a normal mood and affect. Her behavior is normal.  Nursing note and vitals reviewed.   ED Course  Reduction of dislocation Date/Time: 11/26/2014 4:03 PM Performed by: Bary Castilla LYN Authorized by: Bary Castilla LYN Consent: Verbal consent  obtained. Written consent obtained. Risks and benefits: risks, benefits and alternatives were discussed Consent given by: patient Patient understanding: patient states understanding of the procedure being performed Patient consent: the patient's understanding of the procedure matches consent given Procedure consent: procedure consent matches procedure scheduled Relevant documents: relevant documents present and verified Test results: test results available and properly labeled Site marked: the operative site was marked Imaging studies: imaging studies available Required items: required blood products, implants, devices, and special equipment available Patient identity confirmed: verbally with patient Time out: Immediately prior to procedure a "time out" was called to verify the correct patient, procedure, equipment, support staff and site/side marked as required. Preparation: Patient was prepped and draped in the usual sterile fashion. Local anesthesia used: no Patient sedated: yes Sedatives: ketamine and propofol Sedation start date/time: 11/26/2014 3:50 PM Sedation end date/time: 11/26/2014 4:03 PM Vitals: Vital signs were monitored during sedation. Patient tolerance: Patient tolerated the procedure well with no  immediate complications  Procedural sedation Date/Time: 11/26/2014 4:06 PM Performed by: Bary Castilla LYN Authorized by: Bary Castilla LYN Consent: Verbal consent obtained. Written consent obtained. Risks and benefits: risks, benefits and alternatives were discussed Consent given by: patient Patient understanding: patient states understanding of the procedure being performed Patient consent: the patient's understanding of the procedure matches consent given Procedure consent: procedure consent matches procedure scheduled Relevant documents: relevant documents present and verified Test results: test results available and properly labeled Site marked: the operative site  was marked Imaging studies: imaging studies available Required items: required blood products, implants, devices, and special equipment available Patient identity confirmed: verbally with patient Time out: Immediately prior to procedure a "time out" was called to verify the correct patient, procedure, equipment, support staff and site/side marked as required. Local anesthesia used: no Patient tolerance: Patient tolerated the procedure well with no immediate complications   (including critical care time) Labs Review Labs Reviewed - No data to display  Imaging Review No results found.   EKG Interpretation None      MDM   Final diagnoses:  None    Patient is 64 year old female presenting with left hip pain after fall. Patient reportedly was just walking down the driveway and her left hip became dislocated. Patient has had this happen one time since her last revision in May. Patient is patient of Dr. Constance Goltz of [facility] orthopedics. We will treat pain get x-ray and touch base with orthopedics.  Bethani Brugger Randall An, MD 11/26/14 1340   4:07 PM Patient's orthopedist requested that we attempted first. Patient was sedated with ketamine and propofol. And hip was reduced.    Kasaundra Fahrney Randall An, MD 11/26/14 [phone]

## 2014-11-26 NOTE — ED Notes (Signed)
Bed: WA09 Expected date:  Expected time:  Means of arrival:  Comments: Ems fall hip dislocation

## 2014-11-26 NOTE — ED Notes (Signed)
Pt from home. Hx of L hip replacement in 2008 and revision surgery in May. Pt dislocated same hip 3 weeks after surgery. Today pt was walking in her yard and felt her hip give out. Pt then fell on top of L hip. Pt thinks it dislocated again. EMS gave fentanyl prior to arrival.

## 2014-11-26 NOTE — Sedation Documentation (Signed)
Pt now responding to verbal stimuli. Oriented 4x.

## 2014-11-26 NOTE — Sedation Documentation (Signed)
Pt still drowsy and unarousable.

## 2014-11-26 NOTE — Sedation Documentation (Signed)
Pt becoming drowsy. Propofol given. Pt became more sedated and manipulation of R hip started.

## 2014-11-26 NOTE — Sedation Documentation (Signed)
After manipulation by MD and Will PA, hip visibly popped into visible alignment.

## 2014-11-29 DIAGNOSIS — R079 Chest pain, unspecified: Secondary | ICD-10-CM | POA: Diagnosis not present

## 2014-11-29 DIAGNOSIS — I739 Peripheral vascular disease, unspecified: Secondary | ICD-10-CM | POA: Diagnosis not present

## 2014-11-29 DIAGNOSIS — I10 Essential (primary) hypertension: Secondary | ICD-10-CM | POA: Diagnosis not present

## 2014-11-29 DIAGNOSIS — Z720 Tobacco use: Secondary | ICD-10-CM | POA: Diagnosis not present

## 2014-12-03 DIAGNOSIS — R079 Chest pain, unspecified: Secondary | ICD-10-CM | POA: Diagnosis not present

## 2014-12-04 DIAGNOSIS — Z471 Aftercare following joint replacement surgery: Secondary | ICD-10-CM | POA: Diagnosis not present

## 2014-12-04 DIAGNOSIS — Z96642 Presence of left artificial hip joint: Secondary | ICD-10-CM | POA: Diagnosis not present

## 2015-01-15 DIAGNOSIS — Z96642 Presence of left artificial hip joint: Secondary | ICD-10-CM | POA: Diagnosis not present

## 2015-01-15 DIAGNOSIS — Z471 Aftercare following joint replacement surgery: Secondary | ICD-10-CM | POA: Diagnosis not present

## 2015-01-15 DIAGNOSIS — M5137 Other intervertebral disc degeneration, lumbosacral region: Secondary | ICD-10-CM | POA: Diagnosis not present

## 2015-03-14 ENCOUNTER — Other Ambulatory Visit: Payer: Self-pay | Admitting: Family Medicine

## 2015-03-14 DIAGNOSIS — E785 Hyperlipidemia, unspecified: Secondary | ICD-10-CM | POA: Diagnosis not present

## 2015-03-14 DIAGNOSIS — L309 Dermatitis, unspecified: Secondary | ICD-10-CM | POA: Diagnosis not present

## 2015-03-14 DIAGNOSIS — M5432 Sciatica, left side: Secondary | ICD-10-CM

## 2015-03-14 DIAGNOSIS — Z23 Encounter for immunization: Secondary | ICD-10-CM | POA: Diagnosis not present

## 2015-03-14 DIAGNOSIS — Z1211 Encounter for screening for malignant neoplasm of colon: Secondary | ICD-10-CM | POA: Diagnosis not present

## 2015-03-14 DIAGNOSIS — Z720 Tobacco use: Secondary | ICD-10-CM | POA: Diagnosis not present

## 2015-03-14 DIAGNOSIS — N814 Uterovaginal prolapse, unspecified: Secondary | ICD-10-CM | POA: Diagnosis not present

## 2015-03-14 DIAGNOSIS — R079 Chest pain, unspecified: Secondary | ICD-10-CM | POA: Diagnosis not present

## 2015-03-14 DIAGNOSIS — Z0000 Encounter for general adult medical examination without abnormal findings: Secondary | ICD-10-CM | POA: Diagnosis not present

## 2015-03-14 DIAGNOSIS — I10 Essential (primary) hypertension: Secondary | ICD-10-CM | POA: Diagnosis not present

## 2015-03-14 DIAGNOSIS — M25552 Pain in left hip: Secondary | ICD-10-CM | POA: Diagnosis not present

## 2015-03-17 ENCOUNTER — Other Ambulatory Visit (HOSPITAL_COMMUNITY)
Admission: RE | Admit: 2015-03-17 | Discharge: 2015-03-17 | Disposition: A | Discharge status: Home / Self Care | Payer: Medicare Other | Source: Ambulatory Visit | Attending: Obstetrics and Gynecology | Admitting: Obstetrics and Gynecology

## 2015-03-17 ENCOUNTER — Other Ambulatory Visit: Payer: Self-pay | Admitting: Obstetrics and Gynecology

## 2015-03-17 DIAGNOSIS — Z1151 Encounter for screening for human papillomavirus (HPV): Secondary | ICD-10-CM | POA: Diagnosis not present

## 2015-03-17 DIAGNOSIS — N819 Female genital prolapse, unspecified: Secondary | ICD-10-CM | POA: Diagnosis not present

## 2015-03-17 DIAGNOSIS — N950 Postmenopausal bleeding: Secondary | ICD-10-CM | POA: Diagnosis not present

## 2015-03-17 DIAGNOSIS — Z01411 Encounter for gynecological examination (general) (routine) with abnormal findings: Secondary | ICD-10-CM | POA: Diagnosis not present

## 2015-03-17 DIAGNOSIS — N9089 Other specified noninflammatory disorders of vulva and perineum: Secondary | ICD-10-CM | POA: Diagnosis not present

## 2015-03-17 DIAGNOSIS — R350 Frequency of micturition: Secondary | ICD-10-CM | POA: Diagnosis not present

## 2015-03-17 DIAGNOSIS — Z124 Encounter for screening for malignant neoplasm of cervix: Secondary | ICD-10-CM | POA: Diagnosis not present

## 2015-03-17 DIAGNOSIS — N882 Stricture and stenosis of cervix uteri: Secondary | ICD-10-CM | POA: Diagnosis not present

## 2015-03-19 ENCOUNTER — Ambulatory Visit
Admission: RE | Admit: 2015-03-19 | Discharge: 2015-03-19 | Disposition: A | Discharge status: Home / Self Care | Payer: Medicare Other | Source: Ambulatory Visit | Attending: Family Medicine | Admitting: Family Medicine

## 2015-03-19 DIAGNOSIS — M4806 Spinal stenosis, lumbar region: Secondary | ICD-10-CM | POA: Diagnosis not present

## 2015-03-19 DIAGNOSIS — M5432 Sciatica, left side: Secondary | ICD-10-CM

## 2015-03-25 ENCOUNTER — Other Ambulatory Visit: Payer: Self-pay | Admitting: Neurosurgery

## 2015-03-25 DIAGNOSIS — Z6830 Body mass index (BMI) 30.0-30.9, adult: Secondary | ICD-10-CM | POA: Diagnosis not present

## 2015-03-25 DIAGNOSIS — M25552 Pain in left hip: Secondary | ICD-10-CM | POA: Diagnosis not present

## 2015-03-25 DIAGNOSIS — M5136 Other intervertebral disc degeneration, lumbar region: Secondary | ICD-10-CM | POA: Diagnosis not present

## 2015-03-25 DIAGNOSIS — M5126 Other intervertebral disc displacement, lumbar region: Secondary | ICD-10-CM | POA: Diagnosis not present

## 2015-03-25 LAB — CYTOLOGY - PAP

## 2015-03-31 ENCOUNTER — Encounter (HOSPITAL_COMMUNITY): Payer: Self-pay | Admitting: Vascular Surgery

## 2015-03-31 DIAGNOSIS — N813 Complete uterovaginal prolapse: Secondary | ICD-10-CM | POA: Diagnosis not present

## 2015-03-31 DIAGNOSIS — N950 Postmenopausal bleeding: Secondary | ICD-10-CM | POA: Diagnosis not present

## 2015-03-31 MED ORDER — VANCOMYCIN HCL IN DEXTROSE 1-5 GM/200ML-% IV SOLN
1000.0000 mg | INTRAVENOUS | Status: AC
  Administered 2015-04-01: 1000 mg via INTRAVENOUS
  Filled 2015-03-31: qty 200

## 2015-03-31 NOTE — Progress Notes (Signed)
Anesthesia Chart Review: SAME DAY WORK-UP.  Patient is a 64 year old female scheduled for left L3-4 microdiskectomy on 04/01/15 by Dr. Franky Machoabbell.  History includes smoking, HTN, anemia, hypoglycemia, MRSA (left ear), tonstillectomy, left THA revision 09/09/14. Back in 08/2014, she reported history of chest pain. Had normal stress test 08/2014 at [facility] with Dr. Jacinto HalimGanji (records scanned under Media tab, Correspondence, Encounter 09/09/14). PCP is Dr. Tally Joeavid Swayne.  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  08/30/14 Nuclear stress test: Resting ECG demonstrated NSR and normal resting conduction. Poor r wave progression. Stress ECG non-diagnostic for ischemia. Myocardial perfusion imaging is normal. Overall LV systolic function was normal without regional wall motion abnormalities. LVEF 56%.  She is for labs on arrival.   Kendra Ochsllison Jasim Harari, PA-C [facility] Phone [phone] 03/31/2015 12:48 PM

## 2015-03-31 NOTE — Progress Notes (Signed)
Pt denies SOB and chest pain but is under the care of Dr. Jacinto HalimGanji, cardiology. Pt denies having a cardiac cath and an echo but stated that Dr. Jacinto HalimGanji did a stress test; records requested. Pt made aware to stop  taking Aspirin, otc vitamins, fish oil, and herbal medications. Do not take any NSAIDs ie: Ibuprofen, Advil, Naproxen or any medication containing Aspirin. Pt verbalized understanding of all pre-op instructions.

## 2015-04-01 ENCOUNTER — Ambulatory Visit (HOSPITAL_COMMUNITY)
Admission: RE | Admit: 2015-04-01 | Discharge: 2015-04-03 | Disposition: A | Discharge status: Home / Self Care | Payer: Medicare Other | Source: Ambulatory Visit | Attending: Neurosurgery | Admitting: Neurosurgery

## 2015-04-01 ENCOUNTER — Encounter (HOSPITAL_COMMUNITY): Payer: Self-pay | Admitting: *Deleted

## 2015-04-01 ENCOUNTER — Ambulatory Visit (HOSPITAL_COMMUNITY): Payer: Medicare Other | Admitting: Vascular Surgery

## 2015-04-01 ENCOUNTER — Encounter (HOSPITAL_COMMUNITY)
Admission: RE | Disposition: A | Discharge status: Home / Self Care | Payer: Self-pay | Source: Ambulatory Visit | Attending: Neurosurgery

## 2015-04-01 ENCOUNTER — Ambulatory Visit (HOSPITAL_COMMUNITY): Payer: Medicare Other

## 2015-04-01 DIAGNOSIS — Z96642 Presence of left artificial hip joint: Secondary | ICD-10-CM | POA: Diagnosis not present

## 2015-04-01 DIAGNOSIS — I10 Essential (primary) hypertension: Secondary | ICD-10-CM | POA: Diagnosis not present

## 2015-04-01 DIAGNOSIS — M4806 Spinal stenosis, lumbar region: Secondary | ICD-10-CM | POA: Insufficient documentation

## 2015-04-01 DIAGNOSIS — M4726 Other spondylosis with radiculopathy, lumbar region: Secondary | ICD-10-CM

## 2015-04-01 DIAGNOSIS — F17210 Nicotine dependence, cigarettes, uncomplicated: Secondary | ICD-10-CM | POA: Diagnosis not present

## 2015-04-01 DIAGNOSIS — R338 Other retention of urine: Secondary | ICD-10-CM | POA: Insufficient documentation

## 2015-04-01 DIAGNOSIS — Z419 Encounter for procedure for purposes other than remedying health state, unspecified: Secondary | ICD-10-CM

## 2015-04-01 DIAGNOSIS — M5126 Other intervertebral disc displacement, lumbar region: Secondary | ICD-10-CM | POA: Diagnosis not present

## 2015-04-01 DIAGNOSIS — N814 Uterovaginal prolapse, unspecified: Secondary | ICD-10-CM | POA: Insufficient documentation

## 2015-04-01 DIAGNOSIS — D649 Anemia, unspecified: Secondary | ICD-10-CM | POA: Diagnosis not present

## 2015-04-01 DIAGNOSIS — Z9889 Other specified postprocedural states: Secondary | ICD-10-CM | POA: Diagnosis not present

## 2015-04-01 DIAGNOSIS — M5416 Radiculopathy, lumbar region: Secondary | ICD-10-CM | POA: Diagnosis not present

## 2015-04-01 DIAGNOSIS — E7800 Pure hypercholesterolemia, unspecified: Secondary | ICD-10-CM | POA: Diagnosis not present

## 2015-04-01 HISTORY — PX: LUMBAR LAMINECTOMY/DECOMPRESSION MICRODISCECTOMY: SHX5026

## 2015-04-01 HISTORY — DX: Other spondylosis with radiculopathy, lumbar region: M47.26

## 2015-04-01 HISTORY — DX: Other intervertebral disc displacement, lumbar region: M51.26

## 2015-04-01 HISTORY — DX: Pure hypercholesterolemia, unspecified: E78.00

## 2015-04-01 HISTORY — DX: Uterovaginal prolapse, unspecified: N81.4

## 2015-04-01 LAB — BASIC METABOLIC PANEL
ANION GAP: 11 (ref 5–15)
BUN: 16 mg/dL (ref 6–20)
CHLORIDE: 104 mmol/L (ref 101–111)
CO2: 24 mmol/L (ref 22–32)
Calcium: 9.6 mg/dL (ref 8.9–10.3)
Creatinine, Ser: 0.89 mg/dL (ref 0.44–1.00)
GFR calc non Af Amer: >60 mL/min (ref >60)
Glucose, Bld: 83 mg/dL (ref 65–99)
Potassium: 4.2 mmol/L (ref 3.5–5.1)
Sodium: 139 mmol/L (ref 135–145)

## 2015-04-01 LAB — SURGICAL PCR SCREEN
MRSA, PCR: NEGATIVE
STAPHYLOCOCCUS AUREUS: NEGATIVE

## 2015-04-01 LAB — CBC
HCT: 43.3 % (ref 36.0–46.0)
HEMOGLOBIN: 14.2 g/dL (ref 12.0–15.0)
MCH: 28.2 pg (ref 26.0–34.0)
MCHC: 32.8 g/dL (ref 30.0–36.0)
MCV: 85.9 fL (ref 78.0–100.0)
Platelets: 219 10*3/uL (ref 150–400)
RBC: 5.04 MIL/uL (ref 3.87–5.11)
RDW: 15.2 % (ref 11.5–15.5)
WBC: 7 10*3/uL (ref 4.0–10.5)

## 2015-04-01 LAB — GLUCOSE, CAPILLARY: Glucose-Capillary: 85 mg/dL (ref 65–99)

## 2015-04-01 SURGERY — LUMBAR LAMINECTOMY/DECOMPRESSION MICRODISCECTOMY 1 LEVEL
Anesthesia: General | Laterality: Left

## 2015-04-01 MED ORDER — BISACODYL 5 MG PO TBEC: 5.0000 mg | DELAYED_RELEASE_TABLET | Freq: Every day | ORAL | Status: DC | PRN

## 2015-04-01 MED ORDER — ONDANSETRON HCL 4 MG/2ML IJ SOLN: 4.0000 mg | INTRAMUSCULAR | Status: DC | PRN

## 2015-04-01 MED ORDER — EPHEDRINE SULFATE 50 MG/ML IJ SOLN
INTRAMUSCULAR | Status: AC
  Filled 2015-04-01: qty 1

## 2015-04-01 MED ORDER — NEOSTIGMINE METHYLSULFATE 10 MG/10ML IV SOLN
INTRAVENOUS | Status: AC
  Filled 2015-04-01: qty 1

## 2015-04-01 MED ORDER — LIDOCAINE-EPINEPHRINE 0.5 %-1:200000 IJ SOLN
INTRAMUSCULAR | Status: DC | PRN
  Administered 2015-04-01: 10 mL
  Administered 2015-04-01: 20 mL

## 2015-04-01 MED ORDER — PHENYLEPHRINE 40 MCG/ML (10ML) SYRINGE FOR IV PUSH (FOR BLOOD PRESSURE SUPPORT)
PREFILLED_SYRINGE | INTRAVENOUS | Status: AC
  Filled 2015-04-01: qty 10

## 2015-04-01 MED ORDER — METHOCARBAMOL 500 MG PO TABS
500.0000 mg | ORAL_TABLET | Freq: Four times a day (QID) | ORAL | Status: DC | PRN
  Administered 2015-04-01: 500 mg via ORAL
  Filled 2015-04-01: qty 1

## 2015-04-01 MED ORDER — MIDAZOLAM HCL 2 MG/2ML IJ SOLN
INTRAMUSCULAR | Status: AC
  Filled 2015-04-01: qty 2

## 2015-04-01 MED ORDER — LIDOCAINE HCL (CARDIAC) 20 MG/ML IV SOLN
INTRAVENOUS | Status: AC
  Filled 2015-04-01: qty 5

## 2015-04-01 MED ORDER — GLYCOPYRROLATE 0.2 MG/ML IJ SOLN
INTRAMUSCULAR | Status: DC | PRN
  Administered 2015-04-01: 0.4 mg via INTRAVENOUS

## 2015-04-01 MED ORDER — OXYCODONE HCL 5 MG/5ML PO SOLN: 5.0000 mg | Freq: Once | ORAL | Status: AC | PRN

## 2015-04-01 MED ORDER — NEOSTIGMINE METHYLSULFATE 10 MG/10ML IV SOLN
INTRAVENOUS | Status: DC | PRN
  Administered 2015-04-01: 3 mg via INTRAVENOUS

## 2015-04-01 MED ORDER — POTASSIUM CHLORIDE IN NACL 20-0.9 MEQ/L-% IV SOLN
INTRAVENOUS | Status: DC
  Administered 2015-04-01: 22:00:00 via INTRAVENOUS
  Filled 2015-04-01 (×5): qty 1000

## 2015-04-01 MED ORDER — ACETAMINOPHEN 325 MG PO TABS: 325.0000 mg | ORAL_TABLET | ORAL | Status: DC | PRN

## 2015-04-01 MED ORDER — PHENYLEPHRINE HCL 10 MG/ML IJ SOLN
INTRAMUSCULAR | Status: DC | PRN
  Administered 2015-04-01 (×8): 80 ug via INTRAVENOUS

## 2015-04-01 MED ORDER — MAGNESIUM CITRATE PO SOLN: 1.0000 | Freq: Once | ORAL | Status: DC | PRN

## 2015-04-01 MED ORDER — PHENOL 1.4 % MT LIQD: 1.0000 | OROMUCOSAL | Status: DC | PRN

## 2015-04-01 MED ORDER — HYDROMORPHONE HCL 1 MG/ML IJ SOLN
INTRAMUSCULAR | Status: AC
  Administered 2015-04-01: 0.5 mg via INTRAVENOUS
  Filled 2015-04-01: qty 1

## 2015-04-01 MED ORDER — HEMOSTATIC AGENTS (NO CHARGE) OPTIME
TOPICAL | Status: DC | PRN
  Administered 2015-04-01: 1 via TOPICAL

## 2015-04-01 MED ORDER — LIDOCAINE HCL (CARDIAC) 20 MG/ML IV SOLN
INTRAVENOUS | Status: DC | PRN
  Administered 2015-04-01: 60 mg via INTRAVENOUS

## 2015-04-01 MED ORDER — POLYETHYLENE GLYCOL 3350 17 G PO PACK: 17.0000 g | PACK | Freq: Two times a day (BID) | ORAL | Status: DC

## 2015-04-01 MED ORDER — ROCURONIUM BROMIDE 100 MG/10ML IV SOLN
INTRAVENOUS | Status: DC | PRN
  Administered 2015-04-01: 50 mg via INTRAVENOUS
  Administered 2015-04-01: 10 mg via INTRAVENOUS

## 2015-04-01 MED ORDER — OXYCODONE-ACETAMINOPHEN 5-325 MG PO TABS
1.0000 | ORAL_TABLET | ORAL | Status: DC | PRN
  Administered 2015-04-03: 1 via ORAL
  Filled 2015-04-01 (×2): qty 1

## 2015-04-01 MED ORDER — PROMETHAZINE HCL 25 MG PO TABS: 12.5000 mg | ORAL_TABLET | Freq: Four times a day (QID) | ORAL | Status: DC | PRN

## 2015-04-01 MED ORDER — FENTANYL CITRATE (PF) 250 MCG/5ML IJ SOLN
INTRAMUSCULAR | Status: AC
  Filled 2015-04-01: qty 5

## 2015-04-01 MED ORDER — SODIUM CHLORIDE 0.9 % IV SOLN: 250.0000 mL | INTRAVENOUS | Status: DC

## 2015-04-01 MED ORDER — HYDROMORPHONE HCL 1 MG/ML IJ SOLN
0.2500 mg | INTRAMUSCULAR | Status: DC | PRN
  Administered 2015-04-01 (×2): 0.5 mg via INTRAVENOUS

## 2015-04-01 MED ORDER — FERROUS SULFATE 325 (65 FE) MG PO TABS: 325.0000 mg | ORAL_TABLET | Freq: Three times a day (TID) | ORAL | Status: DC

## 2015-04-01 MED ORDER — METHOCARBAMOL 500 MG PO TABS
ORAL_TABLET | ORAL | Status: AC
  Filled 2015-04-01: qty 1

## 2015-04-01 MED ORDER — ACETAMINOPHEN 160 MG/5ML PO SOLN
325.0000 mg | ORAL | Status: DC | PRN
  Filled 2015-04-01: qty 20.3

## 2015-04-01 MED ORDER — PROPOFOL 10 MG/ML IV BOLUS
INTRAVENOUS | Status: AC
  Filled 2015-04-01: qty 20

## 2015-04-01 MED ORDER — MIDAZOLAM HCL 5 MG/5ML IJ SOLN
INTRAMUSCULAR | Status: DC | PRN
  Administered 2015-04-01 (×2): 2 mg via INTRAVENOUS

## 2015-04-01 MED ORDER — ZOLPIDEM TARTRATE 5 MG PO TABS: 5.0000 mg | ORAL_TABLET | Freq: Every evening | ORAL | Status: DC | PRN

## 2015-04-01 MED ORDER — BENAZEPRIL HCL 20 MG PO TABS
20.0000 mg | ORAL_TABLET | Freq: Every morning | ORAL | Status: DC
  Filled 2015-04-01 (×2): qty 1

## 2015-04-01 MED ORDER — EPHEDRINE SULFATE 50 MG/ML IJ SOLN
INTRAMUSCULAR | Status: DC | PRN
  Administered 2015-04-01 (×5): 10 mg via INTRAVENOUS

## 2015-04-01 MED ORDER — MUPIROCIN 2 % EX OINT
1.0000 "application " | TOPICAL_OINTMENT | Freq: Once | CUTANEOUS | Status: DC
  Filled 2015-04-01: qty 22

## 2015-04-01 MED ORDER — ONDANSETRON HCL 4 MG/2ML IJ SOLN
INTRAMUSCULAR | Status: DC | PRN
  Administered 2015-04-01: 4 mg via INTRAVENOUS

## 2015-04-01 MED ORDER — SENNA 8.6 MG PO TABS
1.0000 | ORAL_TABLET | Freq: Two times a day (BID) | ORAL | Status: DC
  Administered 2015-04-01: 8.6 mg via ORAL
  Filled 2015-04-01 (×4): qty 1

## 2015-04-01 MED ORDER — DOCUSATE SODIUM 100 MG PO CAPS: 100.0000 mg | ORAL_CAPSULE | Freq: Two times a day (BID) | ORAL | Status: DC

## 2015-04-01 MED ORDER — KETOROLAC TROMETHAMINE 30 MG/ML IJ SOLN
30.0000 mg | Freq: Four times a day (QID) | INTRAMUSCULAR | Status: DC
  Administered 2015-04-01 – 2015-04-03 (×7): 30 mg via INTRAVENOUS
  Filled 2015-04-01 (×6): qty 1

## 2015-04-01 MED ORDER — HYDROMORPHONE HCL 1 MG/ML IJ SOLN
0.5000 mg | INTRAMUSCULAR | Status: DC | PRN
  Administered 2015-04-02: 1 mg via INTRAVENOUS
  Administered 2015-04-02: 0.5 mg via INTRAVENOUS
  Administered 2015-04-03 (×2): 1 mg via INTRAVENOUS
  Filled 2015-04-01 (×4): qty 1

## 2015-04-01 MED ORDER — OXYCODONE HCL 5 MG PO TABS
ORAL_TABLET | ORAL | Status: AC
  Filled 2015-04-01: qty 1

## 2015-04-01 MED ORDER — ONDANSETRON HCL 4 MG/2ML IJ SOLN
INTRAMUSCULAR | Status: AC
  Filled 2015-04-01: qty 2

## 2015-04-01 MED ORDER — SENNOSIDES-DOCUSATE SODIUM 8.6-50 MG PO TABS
1.0000 | ORAL_TABLET | Freq: Every evening | ORAL | Status: DC | PRN
Start: 2015-04-01 — End: 2015-04-03

## 2015-04-01 MED ORDER — METOPROLOL SUCCINATE ER 25 MG PO TB24
50.0000 mg | ORAL_TABLET | Freq: Every morning | ORAL | Status: DC
  Filled 2015-04-01 (×2): qty 2

## 2015-04-01 MED ORDER — SODIUM CHLORIDE 0.9 % IJ SOLN
3.0000 mL | Freq: Two times a day (BID) | INTRAMUSCULAR | Status: DC
  Administered 2015-04-01 – 2015-04-02 (×2): 3 mL via INTRAVENOUS

## 2015-04-01 MED ORDER — ATORVASTATIN CALCIUM 40 MG PO TABS
40.0000 mg | ORAL_TABLET | Freq: Every day | ORAL | Status: DC
  Administered 2015-04-02 – 2015-04-03 (×2): 40 mg via ORAL
  Filled 2015-04-01 (×2): qty 1

## 2015-04-01 MED ORDER — ACETAMINOPHEN 325 MG PO TABS: 650.0000 mg | ORAL_TABLET | ORAL | Status: DC | PRN

## 2015-04-01 MED ORDER — PROPOFOL 10 MG/ML IV BOLUS
INTRAVENOUS | Status: DC | PRN
  Administered 2015-04-01: 20 mg via INTRAVENOUS
  Administered 2015-04-01: 130 mg via INTRAVENOUS
  Administered 2015-04-01: 50 mg via INTRAVENOUS

## 2015-04-01 MED ORDER — MENTHOL 3 MG MT LOZG: 1.0000 | LOZENGE | OROMUCOSAL | Status: DC | PRN

## 2015-04-01 MED ORDER — GLYCOPYRROLATE 0.2 MG/ML IJ SOLN
INTRAMUSCULAR | Status: AC
  Filled 2015-04-01: qty 2

## 2015-04-01 MED ORDER — THROMBIN 5000 UNITS EX SOLR
CUTANEOUS | Status: DC | PRN
  Administered 2015-04-01 (×2): 5000 [IU] via TOPICAL

## 2015-04-01 MED ORDER — ACETAMINOPHEN 650 MG RE SUPP: 650.0000 mg | RECTAL | Status: DC | PRN

## 2015-04-01 MED ORDER — SODIUM CHLORIDE 0.9 % IJ SOLN: 3.0000 mL | INTRAMUSCULAR | Status: DC | PRN

## 2015-04-01 MED ORDER — HYDROCODONE-ACETAMINOPHEN 5-325 MG PO TABS
1.0000 | ORAL_TABLET | ORAL | Status: DC | PRN
  Administered 2015-04-01 – 2015-04-02 (×2): 2 via ORAL
  Filled 2015-04-01 (×2): qty 2

## 2015-04-01 MED ORDER — ROCURONIUM BROMIDE 50 MG/5ML IV SOLN
INTRAVENOUS | Status: AC
  Filled 2015-04-01: qty 1

## 2015-04-01 MED ORDER — 0.9 % SODIUM CHLORIDE (POUR BTL) OPTIME
TOPICAL | Status: DC | PRN
  Administered 2015-04-01: 1000 mL

## 2015-04-01 MED ORDER — OXYCODONE HCL 5 MG PO TABS
5.0000 mg | ORAL_TABLET | Freq: Once | ORAL | Status: AC | PRN
  Administered 2015-04-01: 5 mg via ORAL

## 2015-04-01 MED ORDER — LACTATED RINGERS IV SOLN
INTRAVENOUS | Status: DC
  Administered 2015-04-01 (×2): via INTRAVENOUS

## 2015-04-01 MED ORDER — FENTANYL CITRATE (PF) 100 MCG/2ML IJ SOLN
INTRAMUSCULAR | Status: DC | PRN
  Administered 2015-04-01: 100 ug via INTRAVENOUS
  Administered 2015-04-01: 150 ug via INTRAVENOUS
  Administered 2015-04-01: 50 ug via INTRAVENOUS

## 2015-04-01 MED ORDER — DIPHENHYDRAMINE HCL (SLEEP) 25 MG PO TABS: 50.0000 mg | ORAL_TABLET | Freq: Every evening | ORAL | Status: DC | PRN

## 2015-04-01 SURGICAL SUPPLY — 49 items
BAG DECANTER FOR FLEXI CONT (MISCELLANEOUS) ×3 IMPLANT
BENZOIN TINCTURE PRP APPL 2/3 (GAUZE/BANDAGES/DRESSINGS) IMPLANT
BLADE CLIPPER SURG (BLADE) IMPLANT
BUR MATCHSTICK NEURO 3.0 LAGG (BURR) ×3 IMPLANT
CANISTER SUCT 3000ML PPV (MISCELLANEOUS) ×3 IMPLANT
CLOSURE WOUND 1/2 X4 (GAUZE/BANDAGES/DRESSINGS)
DECANTER SPIKE VIAL GLASS SM (MISCELLANEOUS) ×3 IMPLANT
DERMABOND ADVANCED (GAUZE/BANDAGES/DRESSINGS) ×2
DERMABOND ADVANCED .7 DNX12 (GAUZE/BANDAGES/DRESSINGS) ×1 IMPLANT
DRAPE LAPAROTOMY 100X72X124 (DRAPES) ×3 IMPLANT
DRAPE MICROSCOPE LEICA (MISCELLANEOUS) ×3 IMPLANT
DRAPE POUCH INSTRU U-SHP 10X18 (DRAPES) ×3 IMPLANT
DRAPE PROXIMA HALF (DRAPES) ×6 IMPLANT
DRAPE SURG 17X23 STRL (DRAPES) ×3 IMPLANT
DURAPREP 26ML APPLICATOR (WOUND CARE) ×3 IMPLANT
ELECT REM PT RETURN 9FT ADLT (ELECTROSURGICAL) ×3
ELECTRODE REM PT RTRN 9FT ADLT (ELECTROSURGICAL) ×1 IMPLANT
GAUZE SPONGE 4X4 12PLY STRL (GAUZE/BANDAGES/DRESSINGS) IMPLANT
GAUZE SPONGE 4X4 16PLY XRAY LF (GAUZE/BANDAGES/DRESSINGS) IMPLANT
GLOVE ECLIPSE 6.5 STRL STRAW (GLOVE) ×3 IMPLANT
GLOVE EXAM NITRILE LRG STRL (GLOVE) IMPLANT
GLOVE EXAM NITRILE MD LF STRL (GLOVE) IMPLANT
GLOVE EXAM NITRILE XL STR (GLOVE) IMPLANT
GLOVE EXAM NITRILE XS STR PU (GLOVE) IMPLANT
GOWN STRL REUS W/ TWL LRG LVL3 (GOWN DISPOSABLE) ×2 IMPLANT
GOWN STRL REUS W/ TWL XL LVL3 (GOWN DISPOSABLE) IMPLANT
GOWN STRL REUS W/TWL 2XL LVL3 (GOWN DISPOSABLE) IMPLANT
GOWN STRL REUS W/TWL LRG LVL3 (GOWN DISPOSABLE) ×4
GOWN STRL REUS W/TWL XL LVL3 (GOWN DISPOSABLE)
KIT BASIN OR (CUSTOM PROCEDURE TRAY) ×3 IMPLANT
KIT ROOM TURNOVER OR (KITS) ×3 IMPLANT
LIQUID BAND (GAUZE/BANDAGES/DRESSINGS) ×3 IMPLANT
NEEDLE HYPO 25X1 1.5 SAFETY (NEEDLE) ×3 IMPLANT
NEEDLE SPNL 18GX3.5 QUINCKE PK (NEEDLE) IMPLANT
NS IRRIG 1000ML POUR BTL (IV SOLUTION) ×3 IMPLANT
PACK LAMINECTOMY NEURO (CUSTOM PROCEDURE TRAY) ×3 IMPLANT
PAD ARMBOARD 7.5X6 YLW CONV (MISCELLANEOUS) ×9 IMPLANT
RUBBERBAND STERILE (MISCELLANEOUS) ×6 IMPLANT
SLEEVE SURGEON STRL (DRAPES) ×3 IMPLANT
SPONGE LAP 4X18 X RAY DECT (DISPOSABLE) IMPLANT
SPONGE SURGIFOAM ABS GEL SZ50 (HEMOSTASIS) ×3 IMPLANT
STRIP CLOSURE SKIN 1/2X4 (GAUZE/BANDAGES/DRESSINGS) IMPLANT
SUT VIC AB 0 CT1 18XCR BRD8 (SUTURE) ×1 IMPLANT
SUT VIC AB 0 CT1 8-18 (SUTURE) ×2
SUT VIC AB 2-0 CT1 18 (SUTURE) ×3 IMPLANT
SUT VIC AB 3-0 SH 8-18 (SUTURE) ×6 IMPLANT
TOWEL OR 17X24 6PK STRL BLUE (TOWEL DISPOSABLE) ×3 IMPLANT
TOWEL OR 17X26 10 PK STRL BLUE (TOWEL DISPOSABLE) ×3 IMPLANT
WATER STERILE IRR 1000ML POUR (IV SOLUTION) ×3 IMPLANT

## 2015-04-01 NOTE — Anesthesia Procedure Notes (Signed)
Procedure Name: Intubation Date/Time: 04/01/2015 3:04 PM Performed by: Gavin PoundLOWDER, Antonette Hendricks J Pre-anesthesia Checklist: Patient identified, Timeout performed, Emergency Drugs available, Suction available and Patient being monitored Patient Re-evaluated:Patient Re-evaluated prior to inductionOxygen Delivery Method: Circle system utilized Preoxygenation: Pre-oxygenation with 100% oxygen Intubation Type: IV induction Ventilation: Mask ventilation without difficulty Laryngoscope Size: Mac and 3 Grade View: Grade I Tube type: Oral Tube size: 8.0 mm Number of attempts: 1 Placement Confirmation: ETT inserted through vocal cords under direct vision,  breath sounds checked- equal and bilateral and positive ETCO2 Secured at: 21 cm Tube secured with: Tape Dental Injury: Teeth and Oropharynx as per pre-operative assessment

## 2015-04-01 NOTE — Anesthesia Preprocedure Evaluation (Addendum)
Anesthesia Evaluation  Patient identified by MRN, date of birth, ID band Patient awake    Reviewed: Allergy & Precautions, NPO status , Patient's Chart, lab work & pertinent test results  History of Anesthesia Complications Negative for: history of anesthetic complications  Airway Mallampati: II  TM Distance: >3 FB Neck ROM: Full    Dental  (+) Dental Advisory Given, Edentulous Upper, Edentulous Lower   Pulmonary neg shortness of breath, neg sleep apnea, neg COPD, neg recent URI, Current Smoker, neg PE   breath sounds clear to auscultation       Cardiovascular hypertension, Pt. on medications and Pt. on home beta blockers + angina (-) CHF (-) dysrhythmias  Rhythm:Regular     Neuro/Psych  Headaches, neg Seizures negative psych ROS   GI/Hepatic negative GI ROS, Neg liver ROS,   Endo/Other  negative endocrine ROS  Renal/GU negative Renal ROS     Musculoskeletal   Abdominal   Peds  Hematology negative hematology ROS (+)   Anesthesia Other Findings   Reproductive/Obstetrics                            Anesthesia Physical Anesthesia Plan  ASA: II  Anesthesia Plan: General   Post-op Pain Management:    Induction: Intravenous  Airway Management Planned: Oral ETT  Additional Equipment: None  Intra-op Plan:   Post-operative Plan: Extubation in OR  Informed Consent: I have reviewed the patients History and Physical, chart, labs and discussed the procedure including the risks, benefits and alternatives for the proposed anesthesia with the patient or authorized representative who has indicated his/her understanding and acceptance.     Plan Discussed with: CRNA and Surgeon  Anesthesia Plan Comments:         Anesthesia Quick Evaluation

## 2015-04-01 NOTE — Transfer of Care (Signed)
Immediate Anesthesia Transfer of Care Note  Patient: Kendra Mcgrath  Procedure(s) Performed: Procedure(s) with comments: Left lumbar three to lumbar four microdiskectomy Right lumbar four to five discectomy redo (Left) - Left L34 microdiskectomy  Patient Location: PACU  Anesthesia Type:General  Level of Consciousness: awake  Airway & Oxygen Therapy: Patient Spontanous Breathing and Patient connected to nasal cannula oxygen  Post-op Assessment: Report given to RN and Post -op Vital signs reviewed and stable  Post vital signs: Reviewed and stable  Last Vitals:  Filed Vitals:   04/01/15 1132 04/01/15 1759  BP: 138/71 151/75  Pulse: 63 88  Temp: 36.4 C 36.3 C  Resp:  21    Complications: No apparent anesthesia complications

## 2015-04-01 NOTE — H&P (Signed)
BP 138/71 mmHg  Pulse 63  Temp(Src) 97.5 F (36.4 C) (Oral)  Ht 5\' 6"  (1.676 m)  Wt 83.915 kg (185 lb)  BMI 29.87 kg/m2  SpO2 100% Kendra Mcgrath comes in today for evaluation of pain that she has in the back left hip, and left lower extremity to the lateral aspect of the left leg.  Kendra Mcgrath is a patient of mine whom I took to the operating room for a lumbar surgery in 2005.  She said she did very well afterwards, and that was obviously at the L4-5 level.  She has undergone a number of hip operations, having a left hip replacement I believe, in 2008, and then having a revision done in May of 2016.  She has had two subsequent dislocations of the left hip since the May operation.  However, evaluation of the hip was thought not to be the reason she is having the pain now in the left lower extremity.  An MRI was performed which showed a herniated disc at L3-4 eccentric to the left side.  She has had no new bowel or bladder difficulties.  She is 64 years of age, still works as a Research scientist (medical)consultant, and is right-handed.      REVIEW OF SYSTEMS:                        Review of systems is positive for chest pain, hypertension, hypercholesterolemia, leg pain with walking, leg weakness, back pain, leg pain, neck pain, fainting spells, and she at times has been hypoglycemic.  She denies allergic, hematologic, skin, gastrointestinal, constitutional, eye, ear, nose, throat, mouth, or respiratory problems.  On a pain chart she lists pain in the left thigh and leg in the back.     CURRENT MEDICAL PROBLEMS:         She says that she has uterine prolapse, and that she is actually due to have a pelvic floor construction soon also.  She is weak in the left lower extremity and has to take stairs with the right lower extremity leading.  She has been limping she says for a while.  She does have numbness in the right shin, which she says she has had ever since my operation in 2004.     PAST MEDICAL HISTORY:                     She has a history of hypertension.     SOCIAL HISTORY:                                She does smoke a pack of cigarettes a day since age 64.  She does not use alcohol.  She does not have a history of substance abuse.   CURRENT MEDICATIONS:                     She takes tramadol, benazepril, metoprolol, and atorvastatin.     PHYSICAL EXAMINATION:                    Currently, her vital signs are as follows, height 5 feet 6 inches, weight 187.8 pounds.  Blood pressure is 130/71.  Pulse is 71.  Pain is 6/10.  Temperature is 96.9 degrees Fahrenheit.      On examination she does have weakness in the left hip flexors and quadriceps.  She is unable to  take a 14-inch step with the left lower extremity.  She does have normal strength in the right lower extremity.  Reflexes are intact at the left knee of 2+, at the right knee I did not elicit a reflex, 1+ at both ankles.  Proprioception is intact in upper and lower extremities.  Memory, language, attention span, and fund of knowledge is normal.  Speech is clear, it is fluent.  Pupils are equal, round, and reactive to light.  Full extraocular movements, full visual fields.  Hearing is intact to voice.  Symmetric facial sensation and movement.  Uvula elevates in the midline.  Shoulder shrug is normal.  Tongue protrudes in the midline. Gait is antalgic.  She has a limp which she favors the left lower extremity.  Muscle tone, bulk, and coordination are normal.  Romberg is negative.     IMAGING STUDIES:                              MRI of the lumbar spine shows a herniated disc on the left side at L3-4, significant ligamentous hypertrophy, and lumbar stenosis at that level, but certainly the left lateral recess is compromised, as is the left L4 root.  She has a disc at T11-12 eccentric to the left side, which indents the cord.  Cord signal is normal.  She also has what appears to be a herniated disc, calcified disc, on the right at L4-5 where she had surgery,  but the left side is decompressed.  She has degeneration at the L4-5 disc space, again, that is the area where she had surgery.  The disc at L3-4 is also mildly degenerated.      Paraspinous soft tissues are normal.     IMPRESSION/PLAN:                             Kendra Mcgrath states that she has had a great deal of pain.  She has weakness all within the left lower extremity, all can be explained in t-part with her herniated disc, but certainly I think the hip is playing a role.  I do believe surgical decompression of the left L3-4 disc space, and spinal canal, can affect some relief of her discomfort, and hopefully increase the strength in the left lower extremity.  Clearly, some of the hip flexor weakness is mediated by the left hip, but some of that quadriceps I think, is mediated by the disc affecting what appears to be both the lateral recess, L3 and L4. Risks and benefits are well known to her as she has undergone this surgery in the past, bleeding, infection, no relief, need for further surgery, and other risks.  She understands and wishes to proceed.She today stated that once I asked if her right lower extremity was painful, she decided that the right lower extremity is painful. She has asked me to explore the right L4/5 area where it appeared she had a calcified osteophyte. This is a redo area and I did explain that the scar will make the dissection more difficult. She expressed an understanding and would like me to decompress not only L3/4, but also L4/5.

## 2015-04-01 NOTE — Op Note (Signed)
04/01/2015  5:55 PM  PATIENT:  Kendra Harrisamela Mcgrath  64 y.o. female whom today describes radicular pain in both the right and left lower extremities. On the left she is stenotic at L3/4 with what appeared to be a small disc herniation along with ligamentum flavum hypertrophy, and at L4/5 on the right she appeared to have a calcified disc/osteophyte compressing/compromising the right L4,5 roots.  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, radiculopathy  PROCEDURE:  Procedure(s): Left lumbar three to lumbar four foraminotomy and L3 semihemilaminectomy Right lumbar four to five foraminotomy, right L4/5 semihemilaminectomies microdissection  SURGEON:   Surgeon(s): Coletta MemosKyle Emoree Sasaki, MD Loura HaltBenjamin Jared Ditty, MD  ASSISTANTS:Ditty, Sharlet SalinaBenjamin  ANESTHESIA:   general  EBL:  Total I/O In: 1000 [I.V.:1000] Out: -   BLOOD ADMINISTERED:none  CELL SAVER GIVEN:none  COUNT:per nursing  DRAINS: none   SPECIMEN:  No Specimen  DICTATION: Kendra Mcgrath was taken to the operating room, intubated and placed under a general anesthetic without difficulty. She was positioned prone on a Wilson frame with all pressure points padded. Her back was prepped and draped in a sterile manner. I opened the skin with a 10 blade and carried the dissection down to the thoracolumbar fascia. I used both sharp dissection and the monopolar cautery to expose the lamina of L3 and 4 on the left, and L4 and L5 on the left. I confirmed my location with an intraoperative xray.  I used the drill, Kerrison punches, and curettes to perform a semihemilaminectomy of L3 on the left, and L4 and 5 on the right. I used the punches to remove the ligamentum flavum to expose the thecal sac at each surgical site. I brought the microscope into the operative field and with Dr.Ditty's assistance we started our  decompression of the spinal canal, thecal sac and L3,4, and 5 root(s). I identified the thecal sac on the left side, but could not despite a very lateral exposure move the dura off the disc space. I checked my location with another xray, and my instrument was overlying the 3/4 disc. The dura had a band restricting retraction, and their certainly appeared to be two distinct dural tubs lateral to the common dural tube. Thus I performed a generous foraminotomy to decompress the L3, and L4 roots.On the right side I found a great deal of scar on the dura. I was able to expose the disc space but as was suspected there was no disc fragment, as the space was quite collapsed. I did fully decompress the L5 root and the thecal sac on the right side. I explored rostrally, laterally, medially, and caudally and was satisfied with the decompression at each level. I irrigated the wound, then closed in layers. I approximated the thoracolumbar fascia, subcutaneous, and subcuticular planes with vicryl sutures. I used dermabond for a sterile dressing.   PLAN OF CARE: Admit for overnight observation  PATIENT DISPOSITION:  PACU - hemodynamically stable.   Delay start of Pharmacological VTE agent (>24hrs) due to surgical blood loss or risk of bleeding:  yes

## 2015-04-02 ENCOUNTER — Encounter (HOSPITAL_COMMUNITY): Payer: Self-pay | Admitting: Neurosurgery

## 2015-04-02 DIAGNOSIS — E7800 Pure hypercholesterolemia, unspecified: Secondary | ICD-10-CM | POA: Diagnosis not present

## 2015-04-02 DIAGNOSIS — M5126 Other intervertebral disc displacement, lumbar region: Secondary | ICD-10-CM | POA: Diagnosis not present

## 2015-04-02 DIAGNOSIS — M4726 Other spondylosis with radiculopathy, lumbar region: Secondary | ICD-10-CM | POA: Diagnosis not present

## 2015-04-02 DIAGNOSIS — M4806 Spinal stenosis, lumbar region: Secondary | ICD-10-CM | POA: Diagnosis not present

## 2015-04-02 DIAGNOSIS — R338 Other retention of urine: Secondary | ICD-10-CM | POA: Diagnosis not present

## 2015-04-02 DIAGNOSIS — I10 Essential (primary) hypertension: Secondary | ICD-10-CM | POA: Diagnosis not present

## 2015-04-02 NOTE — Progress Notes (Signed)
Patient ID: Kendra Mcgrath, female   DOB: 10/15/1950, 64 y.o.   MRN: [id number] BP 98/72 mmHg  Pulse 68  Temp(Src) 98 F (36.7 C) (Oral)  Resp 16  Ht 5\' 6"  (1.676 m)  Wt 83.915 kg (185 lb)  BMI 29.87 kg/m2  SpO2 98% Difficulty voiding, had foley placed today Lower extremity pain is improved. Moving lower extremities well Wound is clean dry, and without signs of infection.

## 2015-04-02 NOTE — Anesthesia Postprocedure Evaluation (Signed)
Anesthesia Post Note  Patient: Kendra Mcgrath  Procedure(s) Performed: Procedure(s) (LRB): Left lumbar three to lumbar four microdiskectomy Right lumbar four to five discectomy redo (Left)  Patient location during evaluation: PACU Anesthesia Type: General Level of consciousness: awake and alert Pain management: pain level controlled Vital Signs Assessment: post-procedure vital signs reviewed and stable Respiratory status: spontaneous breathing Cardiovascular status: blood pressure returned to baseline Anesthetic complications: no    Last Vitals:  Filed Vitals:   04/02/15 1856 04/02/15 2116  BP: 98/72 107/46  Pulse: 68 76  Temp: 36.7 C 36.6 C  Resp: 16 16    Last Pain:  Filed Vitals:   04/02/15 2116  PainSc: 3                  Kennieth RadFitzgerald, Domini Vandehei E

## 2015-04-02 NOTE — Progress Notes (Signed)
   04/02/15 0230  Vitals  Temp (!) 95.6 F (35.3 C)  Temp Source Oral  BP (!) 103/39 mmHg  BP Location Right Arm  BP Method Automatic  Patient Position (if appropriate) Lying  Pulse Rate (!) 59  Pulse Rate Source Dinamap  Resp 16  Oxygen Therapy  SpO2 97 %  O2 Device Room Air  Patient skin  Is warm and dry to touch. Patient stated it's always been difficult to have her temperature read, but declined to have her rectal temperature taken despite education. Will re-attempt.

## 2015-04-02 NOTE — Progress Notes (Signed)
Pt complained of pain to lower abdomen. Pt stated that she was having difficulty voiding. Pt was straight catheterized per Md order. Md notified received verbal instructions  to insert foley catheter and leave in after pt was catheterized a second time due to pain and difficulty voiding. Pt in bed at this time asleep denying any pain or discomfort. Foley catheter draining clear yellow urine to bag. Will continue to monitor.

## 2015-04-03 DIAGNOSIS — I10 Essential (primary) hypertension: Secondary | ICD-10-CM | POA: Diagnosis not present

## 2015-04-03 DIAGNOSIS — M4726 Other spondylosis with radiculopathy, lumbar region: Secondary | ICD-10-CM | POA: Diagnosis not present

## 2015-04-03 DIAGNOSIS — R338 Other retention of urine: Secondary | ICD-10-CM | POA: Diagnosis not present

## 2015-04-03 DIAGNOSIS — E7800 Pure hypercholesterolemia, unspecified: Secondary | ICD-10-CM | POA: Diagnosis not present

## 2015-04-03 DIAGNOSIS — M4806 Spinal stenosis, lumbar region: Secondary | ICD-10-CM | POA: Diagnosis not present

## 2015-04-03 DIAGNOSIS — M5126 Other intervertebral disc displacement, lumbar region: Secondary | ICD-10-CM | POA: Diagnosis not present

## 2015-04-03 MED ORDER — OXYCODONE-ACETAMINOPHEN 5-325 MG PO TABS: 1.0000 | ORAL_TABLET | Freq: Four times a day (QID) | ORAL | Status: DC | PRN

## 2015-04-03 MED ORDER — CYCLOBENZAPRINE HCL 10 MG PO TABS: 10.0000 mg | ORAL_TABLET | Freq: Three times a day (TID) | ORAL | Status: DC | PRN

## 2015-04-03 NOTE — Progress Notes (Signed)
Pt is voiding and is ready to go home. Pt discharging at this time alert,verbal taking all personal belongings. Discharge instructions and prescriptions provided with verbal understanding. IV discontinued, dry dressing applied. Pt notified that office will follow up for appt. No noted distress. Surgical dressing site clean and dry with no s/s of infection.

## 2015-04-03 NOTE — Progress Notes (Signed)
Received MD verbal order to remove pt foley catheter. Foley removed, emptied 150 ml of clear, yellow urine. Pt denies pain or discomfort. Pt due to void. Will continue to monitor.

## 2015-04-03 NOTE — Care Management Note (Signed)
Case Management Note  Patient Details  Name: Kendra Mcgrath MRN: [id number] Date of Birth: 12/12/1950  Subjective/Objective:                    Action/Plan: Patient was admitted for a Left lumbar three to lumbar four microdiskectomy Right lumbar four to five discectomy redo. Lives at home with spouse. Will follow for discharge needs pending patient progress and physician orders.  Expected Discharge Date:                  Expected Discharge Plan:  Home/Self Care  In-House Referral:     Discharge planning Services     Post Acute Care Choice:    Choice offered to:     DME Arranged:    DME Agency:     HH Arranged:    HH Agency:     Status of Service:  Completed, signed off  Medicare Important Message Given:    Date Medicare IM Given:    Medicare IM give by:    Date Additional Medicare IM Given:    Additional Medicare Important Message give by:     If discussed at Long Length of Stay Meetings, dates discussed:    Additional Comments:  Anda KraftRobarge, Hayleen Clinkscales C, RN 04/03/2015, 1:38 PM

## 2015-04-03 NOTE — Discharge Instructions (Signed)
Lumbar Discectomy °Care After °A discectomy involves removal of discmaterial (the cartilage-like structures located between the bones of the back). It is done to relieve pressure on nerve roots. It can be used as a treatment for a back problem. The time in surgery depends on the findings in surgery and what is necessary to correct the problems. °HOME CARE INSTRUCTIONS  °· Check the cut (incision) made by the surgeon twice a day for signs of infection. Some signs of infection may include:  °· A foul smelling, greenish or yellowish discharge from the wound.  °· Increased pain.  °· Increased redness over the incision (operative) site.  °· The skin edges may separate.  °· Flu-like symptoms (problems).  °· A temperature above 101.5° F (38.6° C).  °· Change your bandages in about 24 to 36 hours following surgery or as directed.  °· You may shower tomrrow.  Avoid bathtubs, swimming pools and hot tubs for three weeks or until your incision has healed completely. °· Follow your doctor's instructions as to safe activities, exercises, and physical therapy.  °· Weight reduction may be beneficial if you are overweight.  °· Daily exercise is helpful to prevent the return of problems. Walking is permitted. You may use a treadmill without an incline. Cut down on activities and exercise if you have discomfort. You may also go up and down stairs as much as you can tolerate.  °· DO NOT lift anything heavier than 10 to 15 lbs. Avoid bending or twisting at the waist. Always bend your knees when lifting.  °· Maintain strength and range of motion as instructed.  °· Do not drive for 10 days, or as directed by your doctors. You may be a passenger . Lying back in the passenger seat may be more comfortable for you. Always wear a seatbelt.  °· Limit your sitting in a regular chair to 20 to 30 minutes at a time. There are no limitations for sitting in a recliner. You should lie down or walk in between sitting periods.  °· Only take  over-the-counter or prescription medicines for pain, discomfort, or fever as directed by your caregiver.  °SEEK MEDICAL CARE IF:  °· There is increased bleeding (more than a small spot) from the wound.  °· You notice redness, swelling, or increasing pain in the wound.  °· Pus is coming from wound.  °· You develop an unexplained oral temperature above 102° F (38.9° C) develops.  °· You notice a foul smell coming from the wound or dressing.  °· You have increasing pain in your wound.  °SEEK IMMEDIATE MEDICAL CARE IF:  °· You develop a rash.  °· You have difficulty breathing.  °· You develop any allergic problems to medicines given.  °Document Released: 03/17/2004 Document Revised: 04/01/2011 Document Reviewed: 07/06/2007 °ExitCare® Patient Information °

## 2015-04-03 NOTE — Discharge Summary (Signed)
Physician Discharge Summary  Patient ID: Kendra Mcgrath MRN: [id number] DOB/AGE: 65/05/1950 65 y.o.  Admit date: 04/01/2015 Discharge date: 04/03/2015  Admission Diagnoses:HNP L3/4, L4/5  Discharge Diagnoses:  Active Problems:   Osteoarthritis of spine with radiculopathy, lumbar region   Discharged Condition: good  Hospital Course: Kendra Mcgrath was admitted and taken to the hospital for lumbar laminectomies and discetomies on the left at L3/4, and on the right at L4/5. At L3/4 i encountered a conjoined root, thus I did not perform a discetomy but a generous decompression. At L4/5 I removed a great deal of scar tissue, but there was no disc herniation. So that too was generously decompressed. Post op she had some urinary retention which did resolve.  Treatments: surgery: as above  Discharge Exam: Blood pressure 117/53, pulse 67, temperature 98 F (36.7 C), temperature source Oral, resp. rate 16, height 5\' 6"  (1.676 m), weight 83.915 kg (185 lb), SpO2 99 %. General appearance: alert, cooperative and appears stated age Neurologic: Alert and oriented X 3, normal strength and tone. Normal symmetric reflexes. Normal coordination and gait  Disposition: 01-Home or Self Care lumbar herniated disc    Medication List    TAKE these medications        atorvastatin 40 MG tablet  Commonly known as:  LIPITOR  Take 40 mg by mouth daily.     benazepril 20 MG tablet  Commonly known as:  LOTENSIN  Take 20 mg by mouth every morning.     cyclobenzaprine 10 MG tablet  Commonly known as:  FLEXERIL  Take 1 tablet (10 mg total) by mouth 3 (three) times daily as needed for muscle spasms.     diphenhydrAMINE 25 MG tablet  Commonly known as:  SOMINEX  Take 50 mg by mouth at bedtime as needed for itching, allergies or sleep.     docusate sodium 100 MG capsule  Commonly known as:  COLACE  Take 1 capsule (100 mg total) by mouth 2 (two) times daily.     ferrous sulfate 325 (65 FE) MG tablet  Take  1 tablet (325 mg total) by mouth 3 (three) times daily after meals.     HYDROcodone-acetaminophen 7.5-325 MG tablet  Commonly known as:  NORCO  Take 1-2 tablets by mouth every 4 (four) hours as needed for moderate pain.     methocarbamol 500 MG tablet  Commonly known as:  ROBAXIN  Take 1 tablet (500 mg total) by mouth every 6 (six) hours as needed for muscle spasms.     metoprolol succinate 50 MG 24 hr tablet  Commonly known as:  TOPROL-XL  Take 50 mg by mouth every morning. Take with or immediately following a meal.     oxyCODONE-acetaminophen 5-325 MG tablet  Commonly known as:  PERCOCET/ROXICET  Take 1 tablet by mouth every 4 (four) hours as needed for severe pain.     oxyCODONE-acetaminophen 5-325 MG tablet  Commonly known as:  ROXICET  Take 1 tablet by mouth every 6 (six) hours as needed for severe pain.     polyethylene glycol packet  Commonly known as:  MIRALAX / GLYCOLAX  Take 17 g by mouth 2 (two) times daily.     promethazine 12.5 MG tablet  Commonly known as:  PHENERGAN  Take 1 tablet (12.5 mg total) by mouth every 6 (six) hours as needed for nausea or vomiting.           Follow-up Information    Follow up with Farrie Sann L, MD In 3  weeks.   Specialty:  Neurosurgery   Why:  call office to make an appointment   Contact information:   [address] [place] [address] [phone]       Signed: Carmela Hurt 04/03/2015, 1:08 PM

## 2015-04-16 DIAGNOSIS — N814 Uterovaginal prolapse, unspecified: Secondary | ICD-10-CM | POA: Diagnosis not present

## 2015-04-16 DIAGNOSIS — B373 Candidiasis of vulva and vagina: Secondary | ICD-10-CM | POA: Diagnosis not present

## 2015-05-28 DIAGNOSIS — N814 Uterovaginal prolapse, unspecified: Secondary | ICD-10-CM | POA: Diagnosis not present

## 2015-05-28 DIAGNOSIS — R8290 Unspecified abnormal findings in urine: Secondary | ICD-10-CM | POA: Diagnosis not present

## 2015-06-12 DIAGNOSIS — N72 Inflammatory disease of cervix uteri: Secondary | ICD-10-CM | POA: Diagnosis not present

## 2015-06-12 DIAGNOSIS — N814 Uterovaginal prolapse, unspecified: Secondary | ICD-10-CM | POA: Diagnosis not present

## 2015-06-12 DIAGNOSIS — N952 Postmenopausal atrophic vaginitis: Secondary | ICD-10-CM | POA: Diagnosis not present

## 2015-06-12 DIAGNOSIS — N812 Incomplete uterovaginal prolapse: Secondary | ICD-10-CM | POA: Diagnosis not present

## 2015-06-12 DIAGNOSIS — N838 Other noninflammatory disorders of ovary, fallopian tube and broad ligament: Secondary | ICD-10-CM | POA: Diagnosis not present

## 2015-06-12 DIAGNOSIS — I10 Essential (primary) hypertension: Secondary | ICD-10-CM | POA: Diagnosis not present

## 2015-06-13 DIAGNOSIS — I10 Essential (primary) hypertension: Secondary | ICD-10-CM | POA: Diagnosis not present

## 2015-06-13 DIAGNOSIS — N814 Uterovaginal prolapse, unspecified: Secondary | ICD-10-CM | POA: Diagnosis not present

## 2016-01-01 ENCOUNTER — Other Ambulatory Visit: Payer: Self-pay

## 2016-02-14 ENCOUNTER — Encounter (HOSPITAL_COMMUNITY): Payer: Self-pay | Admitting: Emergency Medicine

## 2016-02-14 ENCOUNTER — Inpatient Hospital Stay (HOSPITAL_COMMUNITY)
Admission: EM | Admit: 2016-02-14 | Discharge: 2016-02-16 | DRG: 287 | Disposition: A | Discharge status: Home / Self Care | Payer: Medicare Other | Attending: Cardiology | Admitting: Cardiology

## 2016-02-14 ENCOUNTER — Emergency Department (HOSPITAL_COMMUNITY): Payer: Medicare Other

## 2016-02-14 DIAGNOSIS — R911 Solitary pulmonary nodule: Secondary | ICD-10-CM | POA: Diagnosis present

## 2016-02-14 DIAGNOSIS — E785 Hyperlipidemia, unspecified: Secondary | ICD-10-CM | POA: Diagnosis not present

## 2016-02-14 DIAGNOSIS — Z8701 Personal history of pneumonia (recurrent): Secondary | ICD-10-CM | POA: Diagnosis not present

## 2016-02-14 DIAGNOSIS — N183 Chronic kidney disease, stage 3 (moderate): Secondary | ICD-10-CM | POA: Diagnosis present

## 2016-02-14 DIAGNOSIS — E7800 Pure hypercholesterolemia, unspecified: Secondary | ICD-10-CM | POA: Diagnosis not present

## 2016-02-14 DIAGNOSIS — Z8614 Personal history of Methicillin resistant Staphylococcus aureus infection: Secondary | ICD-10-CM | POA: Diagnosis not present

## 2016-02-14 DIAGNOSIS — I10 Essential (primary) hypertension: Secondary | ICD-10-CM | POA: Diagnosis not present

## 2016-02-14 DIAGNOSIS — I209 Angina pectoris, unspecified: Secondary | ICD-10-CM | POA: Diagnosis not present

## 2016-02-14 DIAGNOSIS — I25110 Atherosclerotic heart disease of native coronary artery with unstable angina pectoris: Secondary | ICD-10-CM | POA: Diagnosis not present

## 2016-02-14 DIAGNOSIS — R918 Other nonspecific abnormal finding of lung field: Secondary | ICD-10-CM | POA: Diagnosis not present

## 2016-02-14 DIAGNOSIS — R072 Precordial pain: Secondary | ICD-10-CM | POA: Diagnosis not present

## 2016-02-14 DIAGNOSIS — I200 Unstable angina: Secondary | ICD-10-CM | POA: Diagnosis not present

## 2016-02-14 DIAGNOSIS — Z791 Long term (current) use of non-steroidal anti-inflammatories (NSAID): Secondary | ICD-10-CM | POA: Diagnosis not present

## 2016-02-14 DIAGNOSIS — Z8249 Family history of ischemic heart disease and other diseases of the circulatory system: Secondary | ICD-10-CM | POA: Diagnosis not present

## 2016-02-14 DIAGNOSIS — I129 Hypertensive chronic kidney disease with stage 1 through stage 4 chronic kidney disease, or unspecified chronic kidney disease: Secondary | ICD-10-CM | POA: Diagnosis present

## 2016-02-14 DIAGNOSIS — F17210 Nicotine dependence, cigarettes, uncomplicated: Secondary | ICD-10-CM | POA: Diagnosis not present

## 2016-02-14 DIAGNOSIS — Z96642 Presence of left artificial hip joint: Secondary | ICD-10-CM | POA: Diagnosis not present

## 2016-02-14 LAB — CBC
HCT: 36.7 % (ref 36.0–46.0)
HCT: 36.8 % (ref 36.0–46.0)
HEMOGLOBIN: 12.4 g/dL (ref 12.0–15.0)
HEMOGLOBIN: 12.3 g/dL (ref 12.0–15.0)
MCH: 28.3 pg (ref 26.0–34.0)
MCH: 28.1 pg (ref 26.0–34.0)
MCHC: 33.8 g/dL (ref 30.0–36.0)
MCHC: 33.4 g/dL (ref 30.0–36.0)
MCV: 83.8 fL (ref 78.0–100.0)
MCV: 84 fL (ref 78.0–100.0)
PLATELETS: 217 10*3/uL (ref 150–400)
Platelets: 221 10*3/uL (ref 150–400)
RBC: 4.38 MIL/uL (ref 3.87–5.11)
RBC: 4.38 MIL/uL (ref 3.87–5.11)
RDW: 13.4 % (ref 11.5–15.5)
RDW: 13.3 % (ref 11.5–15.5)
WBC: 7.1 10*3/uL (ref 4.0–10.5)
WBC: 6.8 10*3/uL (ref 4.0–10.5)

## 2016-02-14 LAB — BASIC METABOLIC PANEL
ANION GAP: 8 (ref 5–15)
BUN: 18 mg/dL (ref 6–20)
CO2: 22 mmol/L (ref 22–32)
Calcium: 9.4 mg/dL (ref 8.9–10.3)
Chloride: 106 mmol/L (ref 101–111)
Creatinine, Ser: 1.12 mg/dL — ABNORMAL HIGH (ref 0.44–1.00)
GFR calc Af Amer: 58 mL/min — ABNORMAL LOW (ref >60)
GFR, EST NON AFRICAN AMERICAN: 50 mL/min — AB (ref >60)
GLUCOSE: 114 mg/dL — AB (ref 65–99)
Potassium: 3.8 mmol/L (ref 3.5–5.1)
Sodium: 136 mmol/L (ref 135–145)

## 2016-02-14 LAB — I-STAT TROPONIN, ED: TROPONIN I, POC: 0 ng/mL (ref 0.00–0.08)

## 2016-02-14 LAB — TROPONIN I
Troponin I: <0.03 ng/mL (ref <0.03)
Troponin I: <0.03 ng/mL (ref <0.03)

## 2016-02-14 LAB — TSH: TSH: 3.672 u[IU]/mL (ref 0.350–4.500)

## 2016-02-14 MED ORDER — DOCUSATE SODIUM 100 MG PO CAPS
100.0000 mg | ORAL_CAPSULE | Freq: Two times a day (BID) | ORAL | Status: DC
  Filled 2016-02-14: qty 1

## 2016-02-14 MED ORDER — SODIUM CHLORIDE 0.9% FLUSH
3.0000 mL | Freq: Two times a day (BID) | INTRAVENOUS | Status: DC
  Administered 2016-02-14 – 2016-02-15 (×2): 3 mL via INTRAVENOUS

## 2016-02-14 MED ORDER — SODIUM CHLORIDE 0.9% FLUSH
3.0000 mL | Freq: Two times a day (BID) | INTRAVENOUS | Status: DC
  Administered 2016-02-14 (×2): 3 mL via INTRAVENOUS

## 2016-02-14 MED ORDER — METHOCARBAMOL 500 MG PO TABS: 500.0000 mg | ORAL_TABLET | Freq: Four times a day (QID) | ORAL | Status: DC | PRN

## 2016-02-14 MED ORDER — ONDANSETRON HCL 4 MG/2ML IJ SOLN: 4.0000 mg | Freq: Four times a day (QID) | INTRAMUSCULAR | Status: DC | PRN

## 2016-02-14 MED ORDER — SODIUM CHLORIDE 0.9 % IV SOLN: 250.0000 mL | INTRAVENOUS | Status: DC | PRN

## 2016-02-14 MED ORDER — SODIUM CHLORIDE 0.9 % IV SOLN
INTRAVENOUS | Status: DC
  Administered 2016-02-14: 17:00:00 via INTRAVENOUS
  Administered 2016-02-15: 100 mL/h via INTRAVENOUS

## 2016-02-14 MED ORDER — OXYCODONE-ACETAMINOPHEN 5-325 MG PO TABS
1.0000 | ORAL_TABLET | ORAL | Status: DC | PRN
  Administered 2016-02-15: 1 via ORAL
  Filled 2016-02-14: qty 1

## 2016-02-14 MED ORDER — ASPIRIN 300 MG RE SUPP: 300.0000 mg | RECTAL | Status: AC

## 2016-02-14 MED ORDER — BENAZEPRIL HCL 20 MG PO TABS
20.0000 mg | ORAL_TABLET | Freq: Every morning | ORAL | Status: DC
  Administered 2016-02-15: 20 mg via ORAL
  Filled 2016-02-14: qty 1
  Filled 2016-02-14: qty 0.5

## 2016-02-14 MED ORDER — FERROUS SULFATE 325 (65 FE) MG PO TABS
325.0000 mg | ORAL_TABLET | Freq: Three times a day (TID) | ORAL | Status: DC
  Filled 2016-02-14 (×2): qty 1

## 2016-02-14 MED ORDER — SODIUM CHLORIDE 0.9% FLUSH: 3.0000 mL | INTRAVENOUS | Status: DC | PRN

## 2016-02-14 MED ORDER — BUPROPION HCL ER (SR) 150 MG PO TB12
150.0000 mg | ORAL_TABLET | Freq: Two times a day (BID) | ORAL | Status: DC
  Administered 2016-02-14 – 2016-02-16 (×3): 150 mg via ORAL
  Filled 2016-02-14 (×3): qty 1

## 2016-02-14 MED ORDER — HYDROCODONE-ACETAMINOPHEN 7.5-325 MG PO TABS
1.0000 | ORAL_TABLET | ORAL | Status: DC | PRN
  Administered 2016-02-15: 1 via ORAL
  Administered 2016-02-16 (×2): 2 via ORAL
  Filled 2016-02-14: qty 1
  Filled 2016-02-14 (×2): qty 2

## 2016-02-14 MED ORDER — ASPIRIN 81 MG PO CHEW
324.0000 mg | CHEWABLE_TABLET | ORAL | Status: AC
  Administered 2016-02-14: 324 mg via ORAL

## 2016-02-14 MED ORDER — ACETAMINOPHEN 325 MG PO TABS
650.0000 mg | ORAL_TABLET | ORAL | Status: DC | PRN
  Administered 2016-02-14 – 2016-02-15 (×2): 650 mg via ORAL
  Filled 2016-02-14 (×2): qty 2

## 2016-02-14 MED ORDER — SODIUM CHLORIDE 0.9 % WEIGHT BASED INFUSION: 3.0000 mL/kg/h | INTRAVENOUS | Status: AC

## 2016-02-14 MED ORDER — SODIUM CHLORIDE 0.9 % WEIGHT BASED INFUSION
1.0000 mL/kg/h | INTRAVENOUS | Status: DC
  Administered 2016-02-16: 1 mL/kg/h via INTRAVENOUS

## 2016-02-14 MED ORDER — HEPARIN SODIUM (PORCINE) 5000 UNIT/ML IJ SOLN
5000.0000 [IU] | Freq: Three times a day (TID) | INTRAMUSCULAR | Status: DC
  Administered 2016-02-14 – 2016-02-15 (×2): 5000 [IU] via SUBCUTANEOUS
  Filled 2016-02-14 (×3): qty 1

## 2016-02-14 MED ORDER — DIPHENHYDRAMINE HCL (SLEEP) 25 MG PO TABS: 50.0000 mg | ORAL_TABLET | Freq: Every evening | ORAL | Status: DC | PRN

## 2016-02-14 MED ORDER — ATORVASTATIN CALCIUM 10 MG PO TABS
10.0000 mg | ORAL_TABLET | Freq: Every day | ORAL | Status: DC
  Administered 2016-02-14: 10 mg via ORAL
  Filled 2016-02-14: qty 1

## 2016-02-14 MED ORDER — PROMETHAZINE HCL 25 MG PO TABS: 12.5000 mg | ORAL_TABLET | Freq: Four times a day (QID) | ORAL | Status: DC | PRN

## 2016-02-14 MED ORDER — ASPIRIN 81 MG PO CHEW
324.0000 mg | CHEWABLE_TABLET | Freq: Once | ORAL | Status: DC
  Filled 2016-02-14: qty 4

## 2016-02-14 MED ORDER — DIPHENHYDRAMINE HCL 25 MG PO CAPS
50.0000 mg | ORAL_CAPSULE | Freq: Every evening | ORAL | Status: DC | PRN
  Administered 2016-02-14 – 2016-02-15 (×2): 50 mg via ORAL
  Filled 2016-02-14 (×2): qty 2

## 2016-02-14 MED ORDER — SODIUM CHLORIDE 0.9 % IV SOLN
250.0000 mL | INTRAVENOUS | Status: DC | PRN
Start: 2016-02-14 — End: 2016-02-16

## 2016-02-14 MED ORDER — METOPROLOL TARTRATE 50 MG PO TABS
50.0000 mg | ORAL_TABLET | Freq: Two times a day (BID) | ORAL | Status: DC
  Administered 2016-02-14 – 2016-02-16 (×3): 50 mg via ORAL
  Filled 2016-02-14: qty 1
  Filled 2016-02-14 (×3): qty 2

## 2016-02-14 MED ORDER — ASPIRIN EC 81 MG PO TBEC
81.0000 mg | DELAYED_RELEASE_TABLET | Freq: Every day | ORAL | Status: DC
  Administered 2016-02-15: 81 mg via ORAL
  Filled 2016-02-14: qty 1

## 2016-02-14 MED ORDER — CYCLOBENZAPRINE HCL 10 MG PO TABS: 10.0000 mg | ORAL_TABLET | Freq: Three times a day (TID) | ORAL | Status: DC | PRN

## 2016-02-14 MED ORDER — NITROGLYCERIN IN D5W 200-5 MCG/ML-% IV SOLN
0.0000 ug/min | INTRAVENOUS | Status: DC
  Administered 2016-02-14: 5 ug/min via INTRAVENOUS
  Filled 2016-02-14: qty 250

## 2016-02-14 MED ORDER — POLYETHYLENE GLYCOL 3350 17 G PO PACK
17.0000 g | PACK | Freq: Two times a day (BID) | ORAL | Status: DC
  Filled 2016-02-14: qty 1

## 2016-02-14 NOTE — H&P (Signed)
Kendra Mcgrath is an 65 y.o. female.   Chief Complaint: Chest pain HPI: She has known history of hyperlipidemia, hypertension, chronic back pain due to spinal stenosis, who I had seen about 1.5 years ago at that time has had a negative nuclear stress test. She also has history of tobacco use disorder, quit in April 2017. She had been doing well until last 3-4 months, she started noticing tightness in the chest with exertional activity with radiation to her neck and also to her left shoulder. This morning she was seen in the urgent care at [facility], mentioned about worsening chest discomfort and is brought on by simple easy activities, even walking around the house and doing routine chores. With high risk features, patient not being on statin and told to have very high lipids, has been off of medications for the past 8-9 months, she was felt to be extremely high risk for cardiac events, was advised to come to the emergency room.  Fortunately no rest pain, no other associated symptoms, denies shortness of breath. There is no history to suggest TIA or claudication.   Past Medical History:  Diagnosis Date  . Anemia    hx of   . Anginal pain ([employer])    ongoing for last 4-5 years (reported in 08/2014); had normal stress test 08/30/14 [facility] CV)  . Headache   . History of MRSA infection    2012 per patient on left ear   . Hypercholesterolemia   . Hypertension   . Hypoglycemia   . Lumbar herniated disc   . Osteoarthritis of spine with radiculopathy, lumbar region 04/01/2015  . Pneumonia    hx of pneumonia   . Prolapsed uterus     Past Surgical History:  Procedure Laterality Date  . ANTERIOR HIP REVISION Left 09/09/2014   Procedure: LEFT TOTAL POSTERIOR HIP REVISION;  Surgeon: Paralee Cancel, MD;  Location: [facility];  Service: Orthopedics;  Laterality: Left;  . JOINT REPLACEMENT     left hip  . left hip pinning     . LUMBAR LAMINECTOMY/DECOMPRESSION MICRODISCECTOMY Left 04/01/2015   Procedure: Left lumbar  three to lumbar four microdiskectomy Right lumbar four to five discectomy redo;  Surgeon: Ashok Pall, MD;  Location: [facility];  Service: Neurosurgery;  Laterality: Left;  Left L34 microdiskectomy  . TONSILLECTOMY      Family History  Problem Relation Age of Onset  . Hypertension Father   . Cancer Other    Social History:  reports that she has been smoking Cigarettes.  She has a 25.00 pack-year smoking history. She has never used smokeless tobacco. She reports that she drinks alcohol. She reports that she does not use drugs.  Allergies:  Allergies  Allergen Reactions  . Penicillins Hives    Itching and rash Has patient had a PCN reaction causing immediate rash, facial/tongue/throat swelling, SOB or lightheadedness with hypotension: YES Has patient had a PCN reaction causing severe rash involving mucus membranes or skin necrosis: NO Has patient had a PCN reaction that required hospitalization NO Has patient had a PCN reaction occurring within the last 10 years: NO If all of the above answers are "NO", then may proceed with Cephalosporin use.  . Tetanus Toxoids Hives    Itching and rash      (Not in a hospital admission)  Results for orders placed or performed during the hospital encounter of 02/14/16 (from the past 48 hour(s))  Basic metabolic panel     Status: Abnormal   Collection Time:  02/14/16  2:19 PM  Result Value Ref Range   Sodium 136 135 - 145 mmol/L   Potassium 3.8 3.5 - 5.1 mmol/L   Chloride 106 101 - 111 mmol/L   CO2 22 22 - 32 mmol/L   Glucose, Bld 114 (H) 65 - 99 mg/dL   BUN 18 6 - 20 mg/dL   Creatinine, Ser 1.12 (H) 0.44 - 1.00 mg/dL   Calcium 9.4 8.9 - 10.3 mg/dL   GFR calc non Af Amer 50 (L) >60 mL/min   GFR calc Af Amer 58 (L) >60 mL/min    Comment: (NOTE) The eGFR has been calculated using the CKD EPI equation. This calculation has not been validated in all clinical situations. eGFR's persistently <60 mL/min signify possible Chronic  Kidney Disease.    Anion gap 8 5 - 15  CBC     Status: None   Collection Time: 02/14/16  2:19 PM  Result Value Ref Range   WBC 7.1 4.0 - 10.5 K/uL   RBC 4.38 3.87 - 5.11 MIL/uL   Hemoglobin 12.4 12.0 - 15.0 g/dL   HCT 36.7 36.0 - 46.0 %   MCV 83.8 78.0 - 100.0 fL   MCH 28.3 26.0 - 34.0 pg   MCHC 33.8 30.0 - 36.0 g/dL   RDW 13.4 11.5 - 15.5 %   Platelets 221 150 - 400 K/uL  I-stat troponin, ED     Status: None   Collection Time: 02/14/16  2:40 PM  Result Value Ref Range   Troponin i, poc 0.00 0.00 - 0.08 ng/mL   Comment 3            Comment: Due to the release kinetics of cTnI, a negative result within the first hours of the onset of symptoms does not rule out myocardial infarction with certainty. If myocardial infarction is still suspected, repeat the test at appropriate intervals.    Dg Chest 2 View  Result Date: 02/14/2016 CLINICAL DATA:  65 year old female with history of left-sided chest pain radiating to the left shoulder and neck for the past 2 years, worsening over the past 2 months, increased during exertion. Former smoker. EXAM: CHEST  2 VIEW COMPARISON:  Chest x-ray 03/14/2007. FINDINGS: Diffuse peribronchial cuffing. Lung volumes are normal. No consolidative airspace disease. No pleural effusions. No pneumothorax. 8 mm nodular density projecting over the lateral aspect of the left upper lobe between the posterior aspect of the left fourth and fifth ribs, new compared to prior examination 03/04/2007. Pulmonary vasculature and the cardiomediastinal silhouette are within normal limits. Atherosclerosis in the thoracic aorta. IMPRESSION: 1. Diffuse peribronchial cuffing may suggest an acute bronchitis. 2. Aortic atherosclerosis. 3. New 8 mm nodular density projecting over the lateral aspect of the left upper lobe. Repeat standing PA and lateral chest radiograph is recommended in 2-3 months to ensure the stability or the resolution of this finding. Should this lesion increased in  size, further evaluation with chest CT would be recommended at that time. Electronically Signed   By: Vinnie Langton M.D.   On: 02/14/2016 14:50    Review of Systems  Constitutional: Negative for chills and fever.  Respiratory: Negative for cough and hemoptysis.   Cardiovascular: Positive for chest pain. Negative for palpitations, orthopnea, claudication and leg swelling.  Genitourinary: Negative.   Musculoskeletal: Positive for back pain.  Neurological: Negative.  Negative for headaches.  Psychiatric/Behavioral: Negative.     CBC    Component Value Date/Time   WBC 7.1 02/14/2016 1419   RBC 4.38  02/14/2016 1419   HGB 12.4 02/14/2016 1419   HCT 36.7 02/14/2016 1419   PLT 221 02/14/2016 1419   MCV 83.8 02/14/2016 1419   MCH 28.3 02/14/2016 1419   MCHC 33.8 02/14/2016 1419   RDW 13.4 02/14/2016 1419    CMP     Component Value Date/Time   NA 136 02/14/2016 1419   K 3.8 02/14/2016 1419   CL 106 02/14/2016 1419   CO2 22 02/14/2016 1419   GLUCOSE 114 (H) 02/14/2016 1419   BUN 18 02/14/2016 1419   CREATININE 1.12 (H) 02/14/2016 1419   CALCIUM 9.4 02/14/2016 1419   GFRNONAA 50 (L) 02/14/2016 1419   GFRAA 58 (L) 02/14/2016 1419   EKG 02/14/2016: Normal sinus rhythm at a rate of 62 bpm, leftward axis, no evidence of ischemia. Normal QT interval.  Blood pressure 132/89, pulse 63, temperature 97.5 F (36.4 C), temperature source Oral, resp. rate 16, height 5' 8" (1.727 m), weight 89.8 kg (198 lb), SpO2 100 %. Physical Exam  Constitutional: She is oriented to person, place, and time. She appears well-nourished. No distress.  Eyes: EOM are normal.  Cardiovascular: Normal rate, normal heart sounds and intact distal pulses.   Respiratory: Effort normal and breath sounds normal.  GI: Soft. Bowel sounds are normal.  Musculoskeletal: Normal range of motion.  Neurological: She is alert and oriented to person, place, and time.  Skin: Skin is warm.    Assessment/Plan 1. Unstable  angina pectoris with recent worsening symptoms, brought on by routine activities at home. No acute EKG abnormality. 2. Hyperlipidemia 3. Hypertension 4. Tobacco use disorder, quit in April 2017  Recommendation: Patient will be admitted to the hospital and will proceed with coronary angiography on Monday. She is stable hemodynamically and is not having active chest pain. I will start the patient on IV nitroglycerin for now. Will also start on low-dose statin today to obtain a baseline lipid profile tomorrow morning and will increase the statin dose to 80 mg tomorrow once lipids are drawn. Continue beta blocker and ACE inhibitor for now. Discussed risks, benefits and alternatives of angiogram including but not limited to <1% risk of death, stroke, MI, need for urgent surgical revascularization, renal failure, but not limited to thest. patient is willing to proceed. I will follow up on creatinine in the morning, will hydrate her.   Adrian Prows, MD 02/14/2016, 3:30 PM

## 2016-02-14 NOTE — ED Notes (Addendum)
Dr Jacinto Halimganji aware pt is here, he will be coming to see the pt

## 2016-02-14 NOTE — ED Provider Notes (Signed)
[facility]-EMERGENCY DEPT Provider Note   CSN: [id number] Arrival date & time: 02/14/16  1407     History   Chief Complaint Chief Complaint  Patient presents with  . Chest Pain    HPI Kendra Mcgrath is a 65 y.o. female.  The history is provided by the patient and medical records. No language interpreter was used.  Chest Pain   This is a recurrent problem. Episode onset: 1 year ago. The problem occurs daily. The problem has been gradually worsening. The pain is associated with exertion and walking. The pain is present in the substernal region and lateral region. The pain is at a severity of 5/10. The pain is moderate. The quality of the pain is described as exertional and pressure-like. The pain radiates to the left arm. The symptoms are aggravated by exertion. Associated symptoms include exertional chest pressure. Pertinent negatives include no abdominal pain, no back pain, no claudication, no cough, no fever, no hemoptysis, no lower extremity edema, no nausea, no orthopnea, no palpitations and no shortness of breath. She has tried rest for the symptoms. The treatment provided significant relief. Risk factors include sedentary lifestyle.  Her past medical history is significant for CAD, hyperlipidemia and hypertension.  Pertinent negatives for past medical history include no diabetes and no MI.  Procedure history is positive for echocardiogram and exercise treadmill test.  Procedure history is negative for cardiac catheterization.    Past Medical History:  Diagnosis Date  . Anemia    hx of   . Anginal pain ([facility])    ongoing for last 4-5 years (reported in 08/2014); had normal stress test 08/30/14 [facility] CV)  . Headache   . History of MRSA infection    2012 per patient on left ear   . Hypercholesterolemia   . Hypertension   . Hypoglycemia   . Lumbar herniated disc   . Osteoarthritis of spine with radiculopathy, lumbar region 04/01/2015  . Pneumonia    hx of pneumonia   . Prolapsed  uterus     Patient Active Problem List   Diagnosis Date Noted  . Unstable angina pectoris ([facility]) 02/14/2016  . Osteoarthritis of spine with radiculopathy, lumbar region 04/01/2015  . Overweight (BMI 25.0-29.9) 09/11/2014  . S/P revision of left [facility] 09/09/2014    Past Surgical History:  Procedure Laterality Date  . ANTERIOR HIP REVISION Left 09/09/2014   Procedure: LEFT TOTAL POSTERIOR HIP REVISION;  Surgeon: Durene Romans, MD;  Location: [facility];  Service: Orthopedics;  Laterality: Left;  . JOINT REPLACEMENT     left hip  . left hip pinning     . LUMBAR LAMINECTOMY/DECOMPRESSION MICRODISCECTOMY Left 04/01/2015   Procedure: Left lumbar three to lumbar four microdiskectomy Right lumbar four to five discectomy redo;  Surgeon: Coletta Memos, MD;  Location: [facility];  Service: Neurosurgery;  Laterality: Left;  Left L34 microdiskectomy  . TONSILLECTOMY      OB History    No data available       Home Medications    Prior to Admission medications   Medication Sig Start Date End Date Taking? Authorizing Provider  benazepril (LOTENSIN) 20 MG tablet Take 20 mg by mouth every morning.   Yes Historical Provider, MD  buPROPion (WELLBUTRIN SR) 150 MG 12 hr tablet Take 150 mg by mouth 2 (two) times daily.   Yes Historical Provider, MD  diphenhydrAMINE (SOMINEX) 25 MG tablet Take 50 mg by mouth at bedtime as needed for itching, allergies or sleep.   Yes Historical Provider,  MD  metoprolol succinate (TOPROL-XL) 50 MG 24 hr tablet Take 50 mg by mouth every morning. Take with or immediately following a meal.   Yes Historical Provider, MD  naproxen sodium (ALEVE) 220 MG tablet Take 220 mg by mouth 2 (two) times daily with a meal.   Yes Historical Provider, MD  atorvastatin (LIPITOR) 40 MG tablet Take 40 mg by mouth daily. 08/30/14   Historical Provider, MD  cyclobenzaprine (FLEXERIL) 10 MG tablet Take 1 tablet (10 mg total) by mouth 3 (three) times daily as needed for muscle spasms. Patient not taking:  Reported on 02/14/2016 04/03/15   Coletta Memos, MD  docusate sodium (COLACE) 100 MG capsule Take 1 capsule (100 mg total) by mouth 2 (two) times daily. Patient not taking: Reported on 09/27/2014 09/10/14   Lanney Gins, PA-C  ferrous sulfate 325 (65 FE) MG tablet Take 1 tablet (325 mg total) by mouth 3 (three) times daily after meals. Patient not taking: Reported on 04/01/2015 09/10/14   Lanney Gins, PA-C  HYDROcodone-acetaminophen (NORCO) 7.5-325 MG per tablet Take 1-2 tablets by mouth every 4 (four) hours as needed for moderate pain. Patient not taking: Reported on 04/01/2015 09/10/14   Lanney Gins, PA-C  methocarbamol (ROBAXIN) 500 MG tablet Take 1 tablet (500 mg total) by mouth every 6 (six) hours as needed for muscle spasms. Patient not taking: Reported on 04/01/2015 09/10/14   Lanney Gins, PA-C  oxyCODONE-acetaminophen (PERCOCET/ROXICET) 5-325 MG per tablet Take 1 tablet by mouth every 4 (four) hours as needed for severe pain. Patient not taking: Reported on 04/01/2015 11/26/14   Courteney Lyn Mackuen, MD  oxyCODONE-acetaminophen (ROXICET) 5-325 MG tablet Take 1 tablet by mouth every 6 (six) hours as needed for severe pain. 04/03/15   Coletta Memos, MD  polyethylene glycol (MIRALAX / GLYCOLAX) packet Take 17 g by mouth 2 (two) times daily. Patient not taking: Reported on 09/27/2014 09/10/14   Lanney Gins, PA-C  promethazine (PHENERGAN) 12.5 MG tablet Take 1 tablet (12.5 mg total) by mouth every 6 (six) hours as needed for nausea or vomiting. Patient not taking: Reported on 04/01/2015 09/10/14   Lanney Gins, PA-C    Family History Family History  Problem Relation Age of Onset  . Hypertension Father   . Cancer Other     Social History Social History  Substance Use Topics  . Smoking status: Current Every Day Smoker    Packs/day: 0.50    Years: 50.00    Types: Cigarettes  . Smokeless tobacco: Never Used  . Alcohol use Yes     Comment: rare     Allergies   Penicillins and Tetanus  toxoids   Review of Systems Review of Systems  Constitutional: Negative for fever.  HENT: Negative.   Respiratory: Negative for cough, hemoptysis and shortness of breath.   Cardiovascular: Positive for chest pain. Negative for palpitations, orthopnea and claudication.  Gastrointestinal: Negative for abdominal pain and nausea.  Genitourinary: Negative.   Musculoskeletal: Negative for back pain.  Skin: Negative.   Allergic/Immunologic: Negative for immunocompromised state.  Neurological: Negative.   Hematological: Does not bruise/bleed easily.  Psychiatric/Behavioral: Negative.      Physical Exam Updated Vital Signs BP (!) 149/64 (BP Location: Right Arm)   Pulse 61   Temp 97.5 F (36.4 C) (Oral)   Resp 17   Ht 5\' 8"  (1.727 m)   Wt 89.2 kg   SpO2 95%   BMI 29.91 kg/m   Physical Exam  Constitutional: She is oriented to person, place, and  time. She appears well-developed and well-nourished. No distress.  HENT:  Head: Normocephalic and atraumatic.  Eyes: Conjunctivae and EOM are normal.  Neck: Normal range of motion. Neck supple. No JVD present.  Cardiovascular: Normal rate, regular rhythm, normal heart sounds and intact distal pulses.  Exam reveals no gallop and no friction rub.   No murmur heard. Pulmonary/Chest: Effort normal and breath sounds normal. No respiratory distress. She has no wheezes. She has no rales.  Abdominal: Soft. Bowel sounds are normal. She exhibits no distension and no mass. There is no tenderness. There is no rebound and no guarding.  Musculoskeletal: She exhibits no edema.  Neurological: She is alert and oriented to person, place, and time.  Skin: Skin is warm and dry. Capillary refill takes less than 2 seconds. No rash noted. She is not diaphoretic. No pallor.  Psychiatric: She has a normal mood and affect. Her behavior is normal. Judgment and thought content normal.     ED Treatments / Results  Labs (all labs ordered are listed, but only  abnormal results are displayed) Labs Reviewed  BASIC METABOLIC PANEL - Abnormal; Notable for the following:       Result Value   Glucose, Bld 114 (*)    Creatinine, Ser 1.12 (*)    GFR calc non Af Amer 50 (*)    GFR calc Af Amer 58 (*)    All other components within normal limits  CBC  CBC  TROPONIN I  TROPONIN I  TSH  TROPONIN I  COMPREHENSIVE METABOLIC PANEL  LIPID PANEL  I-STAT TROPOININ, ED    EKG  EKG Interpretation  Date/Time:  Saturday February 14 2016 14:13:23 EDT Ventricular Rate:  62 PR Interval:  164 QRS Duration: 90 QT Interval:  428 QTC Calculation: 434 R Axis:   -59 Text Interpretation:  Normal sinus rhythm Left anterior fascicular block Abnormal ECG Confirmed by ZAVITZ MD, Ivin BootyJOSHUA [id number]) on 02/14/2016 3:42:58 PM       Radiology Dg Chest 2 View  Result Date: 02/14/2016 CLINICAL DATA:  65 year old female with history of left-sided chest pain radiating to the left shoulder and neck for the past 2 years, worsening over the past 2 months, increased during exertion. Former smoker. EXAM: CHEST  2 VIEW COMPARISON:  Chest x-ray 03/14/2007. FINDINGS: Diffuse peribronchial cuffing. Lung volumes are normal. No consolidative airspace disease. No pleural effusions. No pneumothorax. 8 mm nodular density projecting over the lateral aspect of the left upper lobe between the posterior aspect of the left fourth and fifth ribs, new compared to prior examination 03/04/2007. Pulmonary vasculature and the cardiomediastinal silhouette are within normal limits. Atherosclerosis in the thoracic aorta. IMPRESSION: 1. Diffuse peribronchial cuffing may suggest an acute bronchitis. 2. Aortic atherosclerosis. 3. New 8 mm nodular density projecting over the lateral aspect of the left upper lobe. Repeat standing PA and lateral chest radiograph is recommended in 2-3 months to ensure the stability or the resolution of this finding. Should this lesion increased in size, further evaluation with chest  CT would be recommended at that time. Electronically Signed   By: Trudie Reedaniel  Entrikin M.D.   On: 02/14/2016 14:50    Procedures Procedures (including critical care time)  Medications Ordered in ED Medications  aspirin EC tablet 81 mg (not administered)  acetaminophen (TYLENOL) tablet 650 mg (not administered)  ondansetron (ZOFRAN) injection 4 mg (not administered)  heparin injection 5,000 Units (5,000 Units Subcutaneous Given 02/14/16 2203)  sodium chloride flush (NS) 0.9 % injection 3 mL (3 mLs Intravenous  Given 02/14/16 1700)  sodium chloride flush (NS) 0.9 % injection 3 mL (not administered)  0.9 %  sodium chloride infusion (not administered)  metoprolol tartrate (LOPRESSOR) tablet 50 mg (50 mg Oral Given 02/14/16 2204)  atorvastatin (LIPITOR) tablet 10 mg (10 mg Oral Given 02/14/16 1822)  nitroGLYCERIN 50 mg in dextrose 5 % 250 mL (0.2 mg/mL) infusion (5 mcg/min Intravenous New Bag/Given 02/14/16 1822)  oxyCODONE-acetaminophen (PERCOCET/ROXICET) 5-325 MG per tablet 1 tablet (not administered)  benazepril (LOTENSIN) tablet 20 mg (not administered)  docusate sodium (COLACE) capsule 100 mg (100 mg Oral Not Given 02/14/16 2200)  ferrous sulfate tablet 325 mg (325 mg Oral Not Given 02/14/16 1800)  HYDROcodone-acetaminophen (NORCO) 7.5-325 MG per tablet 1-2 tablet (not administered)  methocarbamol (ROBAXIN) tablet 500 mg (not administered)  polyethylene glycol (MIRALAX / GLYCOLAX) packet 17 g (17 g Oral Not Given 02/14/16 2200)  promethazine (PHENERGAN) tablet 12.5 mg (not administered)  buPROPion (WELLBUTRIN SR) 12 hr tablet 150 mg (150 mg Oral Given 02/14/16 2203)  0.9 %  sodium chloride infusion ( Intravenous Transfusing/Transfer 02/14/16 1717)  sodium chloride flush (NS) 0.9 % injection 3 mL (3 mLs Intravenous Given 02/14/16 2205)  sodium chloride flush (NS) 0.9 % injection 3 mL (not administered)  0.9 %  sodium chloride infusion (not administered)  0.9% sodium chloride infusion (not  administered)    Followed by  0.9% sodium chloride infusion (not administered)  aspirin chewable tablet 324 mg (324 mg Oral Not Given 02/14/16 1702)  diphenhydrAMINE (BENADRYL) capsule 50 mg (50 mg Oral Given 02/14/16 2204)  aspirin chewable tablet 324 mg (324 mg Oral Given 02/14/16 1658)    Or  aspirin suppository 300 mg ( Rectal See Alternative 02/14/16 1658)     Initial Impression / Assessment and Plan / ED Course  I have reviewed the triage vital signs and the nursing notes.  Pertinent labs & imaging results that were available during my care of the patient were reviewed by me and considered in my medical decision making (see chart for details).  Clinical Course    Patient presents with worsening anginal symptoms over the past two months, c/o exertional chest pain and dyspnea from walking 100 to 200 feet. Has a cardiologist and had a normal stress test 1 year ago but never has had a catheterization. Was sent here by PCP and her cardiologist, Dr. Jacinto Halim, was informed prior to arrival. She is chest pain free here. EKG is NSR without any acute ischemic changes. Initial troponin is undetectable. CXR with peribronchial thickening though no signs of focal infiltrate and she does not have any clinical symptoms of pneumonia. Do not suspect PE as she is not hypoxic or tachycardic, does not have any respiratory symptoms at rest, and her symptoms are far more consistent with angina. Symptoms less consistent with CHF as she had no cardiomegaly or infiltrates, denies orthopnea. Patient's cardiologist evaluated her in the ED and she will be admitted for further management. She is in stable condition and appropriate for floor admission.  Final Clinical Impressions(s) / ED Diagnoses   Final diagnoses:  Precordial pain  Angina pectoris [facility])    New Prescriptions Current Discharge Medication List       Preston Fleeting, MD 02/14/16 1610    Blane Ohara, MD 02/14/16 2340

## 2016-02-14 NOTE — ED Triage Notes (Signed)
Pt. Stated, I've been having chest pain for 2 years and it just keeps getting worse.

## 2016-02-15 LAB — COMPREHENSIVE METABOLIC PANEL
ALK PHOS: 69 U/L (ref 38–126)
ALT: 24 U/L (ref 14–54)
ANION GAP: 6 (ref 5–15)
AST: 23 U/L (ref 15–41)
Albumin: 3.2 g/dL — ABNORMAL LOW (ref 3.5–5.0)
BUN: 18 mg/dL (ref 6–20)
CALCIUM: 8.6 mg/dL — AB (ref 8.9–10.3)
CO2: 25 mmol/L (ref 22–32)
Chloride: 108 mmol/L (ref 101–111)
Creatinine, Ser: 1.08 mg/dL — ABNORMAL HIGH (ref 0.44–1.00)
GFR calc non Af Amer: 52 mL/min — ABNORMAL LOW (ref >60)
Glucose, Bld: 91 mg/dL (ref 65–99)
Potassium: 4 mmol/L (ref 3.5–5.1)
SODIUM: 139 mmol/L (ref 135–145)
Total Bilirubin: 0.3 mg/dL (ref 0.3–1.2)
Total Protein: 5.3 g/dL — ABNORMAL LOW (ref 6.5–8.1)

## 2016-02-15 LAB — LIPID PANEL
Cholesterol: 184 mg/dL (ref 0–200)
HDL: 37 mg/dL — AB (ref >40)
LDL CALC: 109 mg/dL — AB (ref 0–99)
TRIGLYCERIDES: 192 mg/dL — AB (ref <150)
Total CHOL/HDL Ratio: 5 RATIO
VLDL: 38 mg/dL (ref 0–40)

## 2016-02-15 LAB — TROPONIN I

## 2016-02-15 MED ORDER — ATORVASTATIN CALCIUM 40 MG PO TABS
40.0000 mg | ORAL_TABLET | Freq: Every day | ORAL | Status: DC
  Administered 2016-02-15: 40 mg via ORAL
  Filled 2016-02-15: qty 1

## 2016-02-15 NOTE — Progress Notes (Addendum)
Subjective:  No chest pain.   Objective:  Vital Signs in the last 24 hours: Temp:  [97.4 F (36.3 C)-97.8 F (36.6 C)] 97.8 F (36.6 C) (10/22 0514) Pulse Rate:  [53-63] 54 (10/22 0514) Resp:  [13-18] 16 (10/22 0514) BP: (112-162)/(57-118) 114/59 (10/22 0514) SpO2:  [95 %-100 %] 96 % (10/22 0514) FiO2 (%):  [21 %] 21 % (10/21 1521) Weight:  [89.2 kg (196 lb 11.2 oz)-89.8 kg (198 lb)] 89.7 kg (197 lb 12.8 oz) (10/22 0514)  Intake/Output from previous day: 10/21 0701 - 10/22 0700 In: 139.3 [I.V.:139.3] Out: -   Physical Exam:   General appearance: alert, cooperative, appears stated age and no distress Eyes: negative findings: lids and lashes normal Neck: no adenopathy, no carotid bruit, no JVD, supple, symmetrical, trachea midline and thyroid not enlarged, symmetric, no tenderness/mass/nodules Neck: JVP - normal, carotids 2+= without bruits Resp: clear to auscultation bilaterally Chest wall: no tenderness Cardio: regular rate and rhythm, S1, S2 normal, no murmur, click, rub or gallop GI: soft, non-tender; bowel sounds normal; no masses,  no organomegaly Extremities: extremities normal, atraumatic, no cyanosis or edema    Lab Results: BMP  Recent Labs  04/01/15 1243 02/14/16 1419 02/15/16 0327  NA 139 136 139  K 4.2 3.8 4.0  CL 104 106 108  CO2 24 22 25  GLUCOSE 83 114* 91  BUN 16 18 18  CREATININE 0.89 1.12* 1.08*  CALCIUM 9.6 9.4 8.6*  GFRNONAA >60 50* 52*  GFRAA >60 58* >60    CBC  Recent Labs Lab 02/14/16 1612  WBC 6.8  RBC 4.38  HGB 12.3  HCT 36.8  PLT 217  MCV 84.0  MCH 28.1  MCHC 33.4  RDW 13.3     Recent Labs  02/14/16 1612 02/14/16 2130 02/15/16 0327  TROPONINI <0.03 <0.03 <0.03    Recent Labs  02/14/16 1612  TSH 3.672    CHOLESTEROL  Recent Labs  02/15/16 0327  CHOL 184    Hepatic Function Panel  Recent Labs  02/15/16 0327  PROT 5.3*  ALBUMIN 3.2*  AST 23  ALT 24  ALKPHOS 69  BILITOT 0.3    Cardiac  Studies:  EKG 02/14/2016 and 02/15/16 : Normal sinus rhythm at a rate of 62 bpm, leftward axis, no evidence of ischemia. Normal QT interval.   Assessment/Plan:  1. Unstable angina pectoris with recent worsening symptoms, brought on by routine activities at home. No acute EKG abnormality. 2. Hyperlipidemia 3. Hypertension 4. Tobacco use disorder, quit in April 2017 5. Hypertension with stage 3 CKD stable.  Rec: Lipids only mildly abnormal. Was told to have TG >1000 previously. Increase Lipitor to 40 mg. Cath in the morning. Chronic renal insufficiency discussed with patient.  We will f/u abnormal CXR at 3 months. Needs repeat CXR PA/LAT.   Erykah Lippert, M.D. 02/15/2016, 12:17 PM [facility] Pager: [phone] Office: [phone] If no answer: [phone]  

## 2016-02-16 ENCOUNTER — Encounter (HOSPITAL_COMMUNITY): Payer: Self-pay | Admitting: Cardiology

## 2016-02-16 ENCOUNTER — Encounter (HOSPITAL_COMMUNITY)
Admission: EM | Disposition: A | Discharge status: Home / Self Care | Payer: Self-pay | Source: Home / Self Care | Attending: Cardiology

## 2016-02-16 HISTORY — PX: CARDIAC CATHETERIZATION: SHX172

## 2016-02-16 LAB — PROTIME-INR
INR: 0.98
Prothrombin Time: 13 s (ref 11.4–15.2)

## 2016-02-16 SURGERY — LEFT HEART CATH AND CORONARY ANGIOGRAPHY
Anesthesia: LOCAL

## 2016-02-16 MED ORDER — MIDAZOLAM HCL 2 MG/2ML IJ SOLN
INTRAMUSCULAR | Status: AC
  Filled 2016-02-16: qty 2

## 2016-02-16 MED ORDER — AMLODIPINE BESYLATE 5 MG PO TABS: 5.0000 mg | ORAL_TABLET | Freq: Every day | ORAL | Status: DC

## 2016-02-16 MED ORDER — HEPARIN SODIUM (PORCINE) 1000 UNIT/ML IJ SOLN
INTRAMUSCULAR | Status: DC | PRN
  Administered 2016-02-16: 6000 [IU] via INTRAVENOUS

## 2016-02-16 MED ORDER — VERAPAMIL HCL 2.5 MG/ML IV SOLN
INTRAVENOUS | Status: AC
  Filled 2016-02-16: qty 2

## 2016-02-16 MED ORDER — LIDOCAINE HCL (PF) 1 % IJ SOLN
INTRAMUSCULAR | Status: DC | PRN
  Administered 2016-02-16: 2 mL via INTRADERMAL

## 2016-02-16 MED ORDER — SODIUM CHLORIDE 0.9% FLUSH: 3.0000 mL | INTRAVENOUS | Status: DC | PRN

## 2016-02-16 MED ORDER — LIDOCAINE HCL (PF) 1 % IJ SOLN
INTRAMUSCULAR | Status: AC
  Filled 2016-02-16: qty 30

## 2016-02-16 MED ORDER — CLOPIDOGREL BISULFATE 75 MG PO TABS
75.0000 mg | ORAL_TABLET | Freq: Every day | ORAL | Status: DC
  Filled 2016-02-16: qty 1

## 2016-02-16 MED ORDER — FENTANYL CITRATE (PF) 100 MCG/2ML IJ SOLN
INTRAMUSCULAR | Status: DC | PRN
  Administered 2016-02-16: 50 ug via INTRAVENOUS

## 2016-02-16 MED ORDER — IOPAMIDOL (ISOVUE-370) INJECTION 76%
INTRAVENOUS | Status: AC
  Filled 2016-02-16: qty 100

## 2016-02-16 MED ORDER — NITROGLYCERIN 0.4 MG SL SUBL: 0.4000 mg | SUBLINGUAL_TABLET | SUBLINGUAL | 2 refills | Status: AC | PRN

## 2016-02-16 MED ORDER — IOPAMIDOL (ISOVUE-370) INJECTION 76%
INTRAVENOUS | Status: DC | PRN
  Administered 2016-02-16: 45 mL via INTRAVENOUS

## 2016-02-16 MED ORDER — HEPARIN (PORCINE) IN NACL 2-0.9 UNIT/ML-% IJ SOLN
INTRAMUSCULAR | Status: AC
  Filled 2016-02-16: qty 1000

## 2016-02-16 MED ORDER — VERAPAMIL HCL 2.5 MG/ML IV SOLN
INTRA_ARTERIAL | Status: DC | PRN
  Administered 2016-02-16: 5 mL via INTRA_ARTERIAL

## 2016-02-16 MED ORDER — AMLODIPINE BESYLATE 5 MG PO TABS: 5.0000 mg | ORAL_TABLET | Freq: Every day | ORAL | 1 refills | Status: DC

## 2016-02-16 MED ORDER — HEPARIN SODIUM (PORCINE) 1000 UNIT/ML IJ SOLN
INTRAMUSCULAR | Status: AC
  Filled 2016-02-16: qty 1

## 2016-02-16 MED ORDER — ATORVASTATIN CALCIUM 40 MG PO TABS: 40.0000 mg | ORAL_TABLET | Freq: Every day | ORAL | 1 refills | Status: DC

## 2016-02-16 MED ORDER — FENTANYL CITRATE (PF) 100 MCG/2ML IJ SOLN
INTRAMUSCULAR | Status: AC
  Filled 2016-02-16: qty 2

## 2016-02-16 MED ORDER — METOPROLOL SUCCINATE ER 100 MG PO TB24: 100.0000 mg | ORAL_TABLET | Freq: Every morning | ORAL | 1 refills | Status: DC

## 2016-02-16 MED ORDER — MIDAZOLAM HCL 2 MG/2ML IJ SOLN
INTRAMUSCULAR | Status: DC | PRN
  Administered 2016-02-16: 2 mg via INTRAVENOUS

## 2016-02-16 MED ORDER — SODIUM CHLORIDE 0.9 % WEIGHT BASED INFUSION: 3.0000 mL/kg/h | INTRAVENOUS | Status: AC

## 2016-02-16 MED ORDER — HEPARIN (PORCINE) IN NACL 2-0.9 UNIT/ML-% IJ SOLN
INTRAMUSCULAR | Status: DC | PRN
  Administered 2016-02-16: 1000 mL

## 2016-02-16 MED ORDER — CLOPIDOGREL BISULFATE 75 MG PO TABS: 75.0000 mg | ORAL_TABLET | Freq: Every day | ORAL | 1 refills | Status: DC

## 2016-02-16 MED ORDER — SODIUM CHLORIDE 0.9% FLUSH: 3.0000 mL | Freq: Two times a day (BID) | INTRAVENOUS | Status: DC

## 2016-02-16 MED ORDER — BENAZEPRIL HCL 20 MG PO TABS: 20.0000 mg | ORAL_TABLET | Freq: Every morning | ORAL | 1 refills | Status: DC

## 2016-02-16 MED ORDER — AMLODIPINE BESYLATE 5 MG PO TABS
5.0000 mg | ORAL_TABLET | Freq: Every day | ORAL | Status: DC
  Filled 2016-02-16: qty 1

## 2016-02-16 MED ORDER — SODIUM CHLORIDE 0.9 % IV SOLN: 250.0000 mL | INTRAVENOUS | Status: DC | PRN

## 2016-02-16 MED ORDER — NITROGLYCERIN 1 MG/10 ML FOR IR/CATH LAB
INTRA_ARTERIAL | Status: AC
  Filled 2016-02-16: qty 10

## 2016-02-16 SURGICAL SUPPLY — 9 items
CATH OPTITORQUE TIG 4.0 5F (CATHETERS) ×2 IMPLANT
DEVICE RAD COMP TR BAND LRG (VASCULAR PRODUCTS) ×2 IMPLANT
GLIDESHEATH SLEND A-KIT 6F 20G (SHEATH) ×2 IMPLANT
KIT HEART LEFT (KITS) ×2 IMPLANT
PACK CARDIAC CATHETERIZATION (CUSTOM PROCEDURE TRAY) ×2 IMPLANT
TRANSDUCER W/STOPCOCK (MISCELLANEOUS) ×2 IMPLANT
TUBING CIL FLEX 10 FLL-RA (TUBING) ×2 IMPLANT
WIRE HI TORQ VERSACORE-J 145CM (WIRE) ×2 IMPLANT
WIRE SAFE-T 1.5MM-J .035X260CM (WIRE) ×2 IMPLANT

## 2016-02-16 NOTE — H&P (View-Only) (Signed)
Subjective:  No chest pain.   Objective:  Vital Signs in the last 24 hours: Temp:  [97.4 F (36.3 C)-97.8 F (36.6 C)] 97.8 F (36.6 C) (10/22 0514) Pulse Rate:  [53-63] 54 (10/22 0514) Resp:  [13-18] 16 (10/22 0514) BP: (112-162)/(57-118) 114/59 (10/22 0514) SpO2:  [95 %-100 %] 96 % (10/22 0514) FiO2 (%):  [21 %] 21 % (10/21 1521) Weight:  [89.2 kg (196 lb 11.2 oz)-89.8 kg (198 lb)] 89.7 kg (197 lb 12.8 oz) (10/22 0514)  Intake/Output from previous day: 10/21 0701 - 10/22 0700 In: 139.3 [I.V.:139.3] Out: -   Physical Exam:   General appearance: alert, cooperative, appears stated age and no distress Eyes: negative findings: lids and lashes normal Neck: no adenopathy, no carotid bruit, no JVD, supple, symmetrical, trachea midline and thyroid not enlarged, symmetric, no tenderness/mass/nodules Neck: JVP - normal, carotids 2+= without bruits Resp: clear to auscultation bilaterally Chest wall: no tenderness Cardio: regular rate and rhythm, S1, S2 normal, no murmur, click, rub or gallop GI: soft, non-tender; bowel sounds normal; no masses,  no organomegaly Extremities: extremities normal, atraumatic, no cyanosis or edema    Lab Results: BMP  Recent Labs  04/01/15 1243 02/14/16 1419 02/15/16 0327  NA 139 136 139  K 4.2 3.8 4.0  CL 104 106 108  CO2 24 22 25   GLUCOSE 83 114* 91  BUN 16 18 18   CREATININE 0.89 1.12* 1.08*  CALCIUM 9.6 9.4 8.6*  GFRNONAA >60 50* 52*  GFRAA >60 58* >60    CBC  Recent Labs Lab 02/14/16 1612  WBC 6.8  RBC 4.38  HGB 12.3  HCT 36.8  PLT 217  MCV 84.0  MCH 28.1  MCHC 33.4  RDW 13.3     Recent Labs  02/14/16 1612 02/14/16 2130 02/15/16 0327  TROPONINI <0.03 <0.03 <0.03    Recent Labs  02/14/16 1612  TSH 3.672    CHOLESTEROL  Recent Labs  02/15/16 0327  CHOL 184    Hepatic Function Panel  Recent Labs  02/15/16 0327  PROT 5.3*  ALBUMIN 3.2*  AST 23  ALT 24  ALKPHOS 69  BILITOT 0.3    Cardiac  Studies:  EKG 02/14/2016 and 02/15/16 : Normal sinus rhythm at a rate of 62 bpm, leftward axis, no evidence of ischemia. Normal QT interval.   Assessment/Plan:  1. Unstable angina pectoris with recent worsening symptoms, brought on by routine activities at home. No acute EKG abnormality. 2. Hyperlipidemia 3. Hypertension 4. Tobacco use disorder, quit in April 2017 5. Hypertension with stage 3 CKD stable.  Rec: Lipids only mildly abnormal. Was told to have TG >1000 previously. Increase Lipitor to 40 mg. Cath in the morning. Chronic renal insufficiency discussed with patient.  We will f/u abnormal CXR at 3 months. Needs repeat CXR PA/LAT.   Yates DecampGANJI, Sostenes Kauffmann, M.D. 02/15/2016, 12:17 PM [facility] Pager: [phone] Office: [phone] If no answer: [phone]

## 2016-02-16 NOTE — Discharge Summary (Signed)
Physician Discharge Summary  Patient ID: Kendra Mcgrath MRN: [id number] DOB/AGE: 1950-06-06 65 y.o.  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  2nd Mrg lesion, 80 %stenosed.  Right Acute RV Mrg lesion, mild diffuse disease.  The left ventricular systolic function is normal. LV end diastolic pressure is normal.   Recommendation: Patient has a 8mm left lung nodule, long-standing history please disorder, significant increased lung markings on chest x-ray, hence malignancy need to be excluded. The OM branch is moderate sized, does not appear to be unstable and hence would recommend medical therapy with aggressive control of hypertension, hyperlipidemia and continued abstinence from tobacco. I defer to be evaluated by pulmonary medicine for follow-up on COPD and lung nodule. If she continues to have persistent angina pectoris in spite of medical therapy, then I will consider PCI to the obtuse marginal vessel. Patient was made aware of the lung nodule. She'll be discharged home today with outpatient follow-up. 44 mL contrast utilized.    Hospital Course:  Patient admitted to the hospital with symptoms suggestive of unstable angina pectoris, with minimal activities bringing on chest pain which was very classic for angina, she was ruled out for myocardial infarction, underwent coronary angiography, a moderate size vessel had high-grade stenosis, but felt this can be managed medically. No proximal major vessel disease was evident. Left  ventricle systolic function was normal.  On chest x-ray patient was also found to have a lung nodule. Due to high risk for lung malignancy, felt that stenting of the coronary arteries may delay need for biopsy or follow-up, hence if patient continues to have chest pain we could certainly proceed with angioplasty. She'll be referred to be evaluated by pulmonary medicine.   Recommendations on discharge: I have started her on amlodipine 5 mg by mouth daily, increase metoprolol succinate 200 mg daily, will add Plavix 75 mg by mouth daily. She'll continue with aspirin.  Discharge Exam: Blood pressure (!) 161/90, pulse 60, temperature 97.5 F (36.4 C), temperature source Oral, resp. rate 16, height 5\' 8"  (1.727 m), weight 90.6 kg (199 lb 11.2 oz), SpO2 96 %.    Constitutional: She is oriented to person, place, and time. She appears well-nourished. No distress.  Eyes: EOM are normal.  Cardiovascular: Normal rate, normal heart sounds and intact distal pulses.   Respiratory: Effort normal and breath sounds normal.  GI: Soft. Bowel sounds are normal.  Musculoskeletal: Normal range of motion.  Neurological: She is alert and oriented to person, place, and time.  Skin: Skin is warm.  Labs:   Lab Results  Component Value Date   WBC 6.8 02/14/2016   HGB 12.3 02/14/2016   HCT 36.8 02/14/2016   MCV 84.0 02/14/2016   PLT 217 02/14/2016    Recent Labs Lab 02/15/16 0327  NA 139  K 4.0  CL 108  CO2 25  BUN 18  CREATININE 1.08*  CALCIUM 8.6*  PROT 5.3*  BILITOT 0.3  ALKPHOS 69  ALT 24  AST 23  GLUCOSE 91    Lipid Panel     Component Value Date/Time   CHOL 184 02/15/2016 0327   TRIG 192 (  H) 02/15/2016 0327   HDL 37 (L) 02/15/2016 0327   CHOLHDL 5.0 02/15/2016 0327   VLDL 38 02/15/2016 0327   LDLCALC 109 (H) 02/15/2016 0327    Recent Labs  02/14/16 1612 02/14/16 2130 02/15/16 0327  TROPONINI <0.03 <0.03 <0.03   Recent Labs  02/14/16 1612  TSH 3.672    EKG: Normal sinus  rhythm at a rate of 62 bpm, leftward axis, no evidence of ischemia. Normal QT interval.  Radiology: Dg Chest 2 View  Result Date: 02/14/2016 CLINICAL DATA:  65 year old female with history of left-sided chest pain radiating to the left shoulder and neck for the past 2 years, worsening over the past 2 months, increased during exertion. Former smoker. EXAM: CHEST  2 VIEW COMPARISON:  Chest x-ray 03/14/2007. FINDINGS: Diffuse peribronchial cuffing. Lung volumes are normal. No consolidative airspace disease. No pleural effusions. No pneumothorax. 8 mm nodular density projecting over the lateral aspect of the left upper lobe between the posterior aspect of the left fourth and fifth ribs, new compared to prior examination 03/04/2007. Pulmonary vasculature and the cardiomediastinal silhouette are within normal limits. Atherosclerosis in the thoracic aorta. IMPRESSION: 1. Diffuse peribronchial cuffing may suggest an acute bronchitis. 2. Aortic atherosclerosis. 3. New 8 mm nodular density projecting over the lateral aspect of the left upper lobe. Repeat standing PA and lateral chest radiograph is recommended in 2-3 months to ensure the stability or the resolution of this finding. Should this lesion increased in size, further evaluation with chest CT would be recommended at that time. Electronically Signed   By: Trudie Reed M.D.   On: 02/14/2016 14:50    FOLLOW UP PLANS AND APPOINTMENTS    Medication List    TAKE these medications   ALEVE 220 MG tablet Generic drug:  naproxen sodium Take 220 mg by mouth 2 (two) times daily with a meal.   amLODipine 5 MG tablet Commonly known as:  NORVASC Take 1 tablet (5 mg total) by mouth daily.   atorvastatin 40 MG tablet Commonly known as:  LIPITOR Take 1 tablet (40 mg total) by mouth daily.   benazepril 20 MG tablet Commonly known as:  LOTENSIN Take 1 tablet (20 mg total) by mouth every morning.   buPROPion 150 MG 12 hr tablet Commonly known as:   WELLBUTRIN SR Take 150 mg by mouth 2 (two) times daily.   clopidogrel 75 MG tablet Commonly known as:  PLAVIX Take 1 tablet (75 mg total) by mouth daily.   cyclobenzaprine 10 MG tablet Commonly known as:  FLEXERIL Take 1 tablet (10 mg total) by mouth 3 (three) times daily as needed for muscle spasms.   diphenhydrAMINE 25 MG tablet Commonly known as:  SOMINEX Take 50 mg by mouth at bedtime as needed for itching, allergies or sleep.   docusate sodium 100 MG capsule Commonly known as:  COLACE Take 1 capsule (100 mg total) by mouth 2 (two) times daily.   ferrous sulfate 325 (65 FE) MG tablet Take 1 tablet (325 mg total) by mouth 3 (three) times daily after meals.   HYDROcodone-acetaminophen 7.5-325 MG tablet Commonly known as:  NORCO Take 1-2 tablets by mouth every 4 (four) hours as needed for moderate pain.   methocarbamol 500 MG tablet Commonly known as:  ROBAXIN Take 1 tablet (500 mg total) by mouth every 6 (six) hours as needed for muscle spasms.   metoprolol succinate 100 MG 24 hr tablet Commonly known as:  TOPROL-XL Take 1 tablet (100 mg total) by mouth  every morning. Take with or immediately following a meal. What changed:  medication strength  how much to take   nitroGLYCERIN 0.4 MG SL tablet Commonly known as:  NITROSTAT Place 1 tablet (0.4 mg total) under the tongue every 5 (five) minutes as needed for chest pain.   oxyCODONE-acetaminophen 5-325 MG tablet Commonly known as:  PERCOCET/ROXICET Take 1 tablet by mouth every 4 (four) hours as needed for severe pain.   oxyCODONE-acetaminophen 5-325 MG tablet Commonly known as:  ROXICET Take 1 tablet by mouth every 6 (six) hours as needed for severe pain.   polyethylene glycol packet Commonly known as:  MIRALAX / GLYCOLAX Take 17 g by mouth 2 (two) times daily.   promethazine 12.5 MG tablet Commonly known as:  PHENERGAN Take 1 tablet (12.5 mg total) by mouth every 6 (six) hours as needed for nausea or  vomiting.      Follow-up Information    Yates DecampGANJI, Kenisha Lynds, MD. Call today.   Specialty:  Cardiology Why:  To be seen in 2 weeks Contact information: [address] [place] [address] [phone]        Sissy HoffSWAYNE,DAVID W, MD. Call today.   Specialty:  Family Medicine Why:  To be seen in 1 week Contact information: [address] [place] [address] [phone]        Yates DecampGANJI, Tephanie Escorcia, MD On 03/01/2016.   Specialty:  Cardiology Why:  Please come at 2:30 PM to be seen at 2:45 pm. Bring all medications Contact information: [address] [place] [address] [phone]            Yates DecampGANJI, Riggin Cuttino, MD 02/16/2016, 8:48 AM  Pager: [phone] Office: [phone] If no answer: [phone]

## 2016-02-16 NOTE — Interval H&P Note (Signed)
Cath Lab Visit (complete for each Cath Lab visit)  Clinical Evaluation Leading to the Procedure:   ACS: Yes.    Non-ACS:    Anginal Classification: CCS IV  Anti-ischemic medical therapy: Minimal Therapy (1 class of medications)  Non-Invasive Test Results: No non-invasive testing performed  Prior CABG: No previous CABG  History and Physical Interval Note:  02/16/2016 6:55 AM  Shirl HarrisPamela Worley  has presented today for surgery, with the diagnosis of unstable angina  The various methods of treatment have been discussed with the patient and family. After consideration of risks, benefits and other options for treatment, the patient has consented to  Procedure(s): Left Heart Cath and Coronary Angiography (N/A) and possible PCI as a surgical intervention .  The patient's history has been reviewed, patient examined, no change in status, stable for surgery.  I have reviewed the patient's chart and labs.  Questions were answered to the patient's satisfaction.     Yates DecampGANJI, Kaliopi Blyden

## 2016-02-17 NOTE — Consult Note (Signed)
            [facility] CM Primary Care Navigator  02/17/2016  Shirl Harrisamela Drumgoole 03/05/1951 [id number]  Wentto see patient in her room to identify possible discharge needs butpatient was already discharged.  Primary care provider's office called (Bonnie)to notify of patient's discharge and need for post hospital follow-up for transition of care.      For additional questions please contact:  Karin GoldenLorraine A. Khadijah Mastrianni, BSN, RN-BC [facility] PRIMARY CARE Navigator Cell: [phone]

## 2016-02-19 DIAGNOSIS — I2510 Atherosclerotic heart disease of native coronary artery without angina pectoris: Secondary | ICD-10-CM | POA: Diagnosis not present

## 2016-02-19 DIAGNOSIS — N289 Disorder of kidney and ureter, unspecified: Secondary | ICD-10-CM | POA: Diagnosis not present

## 2016-02-19 DIAGNOSIS — R911 Solitary pulmonary nodule: Secondary | ICD-10-CM | POA: Diagnosis not present

## 2016-02-19 DIAGNOSIS — I10 Essential (primary) hypertension: Secondary | ICD-10-CM | POA: Diagnosis not present

## 2016-02-26 DIAGNOSIS — I10 Essential (primary) hypertension: Secondary | ICD-10-CM | POA: Diagnosis not present

## 2016-02-26 DIAGNOSIS — I2510 Atherosclerotic heart disease of native coronary artery without angina pectoris: Secondary | ICD-10-CM | POA: Diagnosis not present

## 2016-02-26 DIAGNOSIS — Z87891 Personal history of nicotine dependence: Secondary | ICD-10-CM | POA: Diagnosis not present

## 2016-02-26 DIAGNOSIS — R911 Solitary pulmonary nodule: Secondary | ICD-10-CM | POA: Diagnosis not present

## 2016-04-12 DIAGNOSIS — N289 Disorder of kidney and ureter, unspecified: Secondary | ICD-10-CM | POA: Diagnosis not present

## 2016-04-12 DIAGNOSIS — R2243 Localized swelling, mass and lump, lower limb, bilateral: Secondary | ICD-10-CM | POA: Diagnosis not present

## 2016-05-21 ENCOUNTER — Other Ambulatory Visit (HOSPITAL_COMMUNITY): Payer: Self-pay | Admitting: Family Medicine

## 2016-05-21 DIAGNOSIS — R911 Solitary pulmonary nodule: Secondary | ICD-10-CM

## 2016-06-02 ENCOUNTER — Ambulatory Visit (HOSPITAL_COMMUNITY)
Admission: RE | Admit: 2016-06-02 | Discharge: 2016-06-02 | Disposition: A | Discharge status: Home / Self Care | Payer: Medicare Other | Source: Ambulatory Visit | Attending: Family Medicine | Admitting: Family Medicine

## 2016-06-02 DIAGNOSIS — J189 Pneumonia, unspecified organism: Secondary | ICD-10-CM | POA: Diagnosis not present

## 2016-06-02 DIAGNOSIS — R911 Solitary pulmonary nodule: Secondary | ICD-10-CM

## 2016-06-02 DIAGNOSIS — R918 Other nonspecific abnormal finding of lung field: Secondary | ICD-10-CM | POA: Insufficient documentation

## 2016-06-02 DIAGNOSIS — Z720 Tobacco use: Secondary | ICD-10-CM | POA: Insufficient documentation

## 2016-06-04 ENCOUNTER — Other Ambulatory Visit: Payer: Self-pay | Admitting: Family Medicine

## 2016-06-04 DIAGNOSIS — J984 Other disorders of lung: Secondary | ICD-10-CM

## 2016-07-09 ENCOUNTER — Ambulatory Visit
Admission: RE | Admit: 2016-07-09 | Discharge: 2016-07-09 | Disposition: A | Discharge status: Home / Self Care | Payer: Medicare Other | Source: Ambulatory Visit | Attending: Family Medicine | Admitting: Family Medicine

## 2016-07-09 DIAGNOSIS — R918 Other nonspecific abnormal finding of lung field: Secondary | ICD-10-CM | POA: Diagnosis not present

## 2016-07-09 DIAGNOSIS — J984 Other disorders of lung: Secondary | ICD-10-CM

## 2016-08-06 DIAGNOSIS — Z6833 Body mass index (BMI) 33.0-33.9, adult: Secondary | ICD-10-CM | POA: Diagnosis not present

## 2016-08-06 DIAGNOSIS — I2510 Atherosclerotic heart disease of native coronary artery without angina pectoris: Secondary | ICD-10-CM | POA: Diagnosis not present

## 2016-08-06 DIAGNOSIS — Z1389 Encounter for screening for other disorder: Secondary | ICD-10-CM | POA: Diagnosis not present

## 2016-08-06 DIAGNOSIS — R609 Edema, unspecified: Secondary | ICD-10-CM | POA: Diagnosis not present

## 2016-08-06 DIAGNOSIS — F339 Major depressive disorder, recurrent, unspecified: Secondary | ICD-10-CM | POA: Diagnosis not present

## 2016-08-06 DIAGNOSIS — E7800 Pure hypercholesterolemia, unspecified: Secondary | ICD-10-CM | POA: Diagnosis not present

## 2016-08-06 DIAGNOSIS — I10 Essential (primary) hypertension: Secondary | ICD-10-CM | POA: Diagnosis not present

## 2016-08-06 DIAGNOSIS — L989 Disorder of the skin and subcutaneous tissue, unspecified: Secondary | ICD-10-CM | POA: Diagnosis not present

## 2016-08-06 DIAGNOSIS — Z1211 Encounter for screening for malignant neoplasm of colon: Secondary | ICD-10-CM | POA: Diagnosis not present

## 2016-08-20 DIAGNOSIS — B078 Other viral warts: Secondary | ICD-10-CM | POA: Diagnosis not present

## 2016-09-24 DIAGNOSIS — Z0189 Encounter for other specified special examinations: Secondary | ICD-10-CM | POA: Diagnosis not present

## 2016-09-24 DIAGNOSIS — J432 Centrilobular emphysema: Secondary | ICD-10-CM | POA: Diagnosis not present

## 2016-09-24 DIAGNOSIS — I2510 Atherosclerotic heart disease of native coronary artery without angina pectoris: Secondary | ICD-10-CM | POA: Diagnosis not present

## 2016-09-24 DIAGNOSIS — I10 Essential (primary) hypertension: Secondary | ICD-10-CM | POA: Diagnosis not present

## 2016-10-15 DIAGNOSIS — J432 Centrilobular emphysema: Secondary | ICD-10-CM | POA: Diagnosis not present

## 2016-10-15 DIAGNOSIS — I2510 Atherosclerotic heart disease of native coronary artery without angina pectoris: Secondary | ICD-10-CM | POA: Diagnosis not present

## 2016-10-15 DIAGNOSIS — Z0189 Encounter for other specified special examinations: Secondary | ICD-10-CM | POA: Diagnosis not present

## 2016-10-15 DIAGNOSIS — I10 Essential (primary) hypertension: Secondary | ICD-10-CM | POA: Diagnosis not present

## 2016-10-25 DIAGNOSIS — I10 Essential (primary) hypertension: Secondary | ICD-10-CM | POA: Diagnosis not present

## 2016-10-25 DIAGNOSIS — I2510 Atherosclerotic heart disease of native coronary artery without angina pectoris: Secondary | ICD-10-CM | POA: Diagnosis not present

## 2016-10-31 DIAGNOSIS — I209 Angina pectoris, unspecified: Secondary | ICD-10-CM | POA: Diagnosis present

## 2016-10-31 NOTE — H&P (Signed)
OFFICE VISIT NOTES COPIED TO EPIC FOR DOCUMENTATION  . History of Present Illness Kendra Maine FNP-C; November 03, 2016 2:21 PM) Patient words: 3 weeks f/u for CAD; last office visit 09/24/2016.  The patient is a 66 year old female who presents for a Follow-up for Coronary artery disease. 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  She has known 80% stenosis of a moderate-sized obtuse marginal by angiography on 02/14/2016 and was recommended medical therapy and lifestyle modification. Patient was seen 3 weeks ago for symptoms of angina have increased pretty significantly that even during routine chores brings on chest tightness but is easily relieved with rest. In spite of significant leg edema she has continued to take amlodipine due to partial relief of chest pain with the medication. Due to chest pain she has reduced her physical activity and is also gained weight since she quit smoking.   Patient now presents for 3 week follow-up. She has continued to have chest pain symptoms that have not worsened. She has stopped amlodipine to see if leg edema would improve. Edema has improved, but not completely resolved. Blood pressure has remained well controlled. At her last office visit, it was felt patient would benefit from coronary angiography, but patient was unable to proceed until July. We have brought her back for close follow up.    Problem List/Past Medical Kendra Mcgrath Reader; 03-Nov-2016 1:26 PM) Chronic headaches (R51)  Insomnia (G47.00)  Benign essential hypertension (I10)  History of tobacco use (Z48.270)  Stopped smoking Aug 1st 2016 CAD in native artery (I25.10)  Coronary Angiography 02/16/2016: 1st Diag lesion, 50 %stenosed.  2nd Mrg lesion, 80 %stenosed.  Right Acute RV Mrg lesion, mild diffuse disease.  The left ventricular  systolic function is normal. Lexiscan myoview stress test 08/30/2014: 1. The resting electrocardiogram demonstrated normal sinus rhythm and normal resting conduction. Poor R wave progression. Stress EKG is non diagnostic for ischemia as it is a pharmacologic stress using Lexiscan. 2. Myocardial perfusion imaging is normal. Overall left ventricular systolic function was normal without regional wall motion abnormalities. The left ventricular ejection fraction was 56%. Screening for AAA (abdominal aortic aneurysm) (Z13.6)  Abdominal aortic duplex 10/08/2014: Diffuse plaque noted in the proximal, mid and distal aorta. Clinical correlation is suggested. Mild ectasia of the abdominal aorta noted measuring 2.45cm. Clinical correlation recommended. Mixed hyperlipidemia (E78.2)  Laboratory examination (Z01.89)  08/06/2016: Cholesterol 152, triglycerides 158, HDL 54, LDL 66. Creatinine 0.96, potassium 4.4, CMP normal. Labs 02/15/2016: BUN 18, serum creatinine 1.08, eGFR 52 mL CMP otherwise normal. TSH normal. CBC normal, Total cholesterol 184, triglycerides 192, HDL 37, LDL 109. Labs 12/03/2014: Total cholesterol 177, triglycerides 261, HDL 44, LDL 81, LDL particle # 1050, LP-IR score 56, creatinine 0.96, alkaline phosphatase 145, AST 40, ALT 64, CMP otherwise normal, CBC normal Labs 08/29/2014: Total cholesterol 225, triglycerides 178, HDL 54, LDL 135, LDL particle # 1788, LP-IR score 60 Centrilobular emphysema (J43.2)  CT chest 07/10/16: No left upper lobe mass. The patient does have emphysematous changes in the upper lobes with pleural and parenchymal scarring  Allergies Kendra Mcgrath Reader; 11-03-2016 1:26 PM) Tetanus Toxoid Adsorbed *TOXOIDS*  Hives. Penicillins  Hives.  Family History Kendra Mcgrath Reader; Nov 03, 2016 1:26 PM) Mother  Deceased. at age 74 from natural causes; no heart attacks or strokes, no other cardiovascular conditions Father  Deceased. patient did not know her biological father. 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 Living Situation  Lives with spouse. Number of Children  5. Current tobacco use  Former smoker. Quit November 25, 2014 Alcohol Use  Occasional alcohol use.  Past Surgical History Kendra Mcgrath Reader; 10/15/2016 1:26 PM) Tonsillectomy [1960]: Rupture Disc Repair [2006]: had it done previous year in 2005 Total Hip Replacement - Left [08/2014]: 02/25/2007- Patient had the hip repaired in 01/25/2007 due to a fall in [place]. Hip Revision- [facility] [09/09/2014]: Left. Hysterectomy; Total [05/2015]: Lower Back Surgery [03/2015]:  Medication History Kendra Maine, FNP-C; 10/15/2016 1:53 PM) AmLODIPine Besylate (5MG Tablet,  Tablet Tablet Oral daily, Taken starting 09/24/2016) Discontinued. Atorvastatin Calcium (40MG Tablet, 1 (one) Tablet Oral daily, Taken starting 08/09/2016) Active. Benazepril HCl (20MG Tablet, 1 (one) Tablet Tablet Oral daily, Taken starting 08/09/2016) Active. BuPROPion HCl ER (SR) (150MG Tablet ER 12HR, 1 (one) Tablet ER 12HR Oral daily, Taken starting 08/06/2016) Active. Metoprolol Tartrate (100MG Tablet, 1 Oral daily) Active. DiphenhydrAMINE HCl (25MG Tablet, 1 Oral at bedtime) Active. (Benadryl) Nitroglycerin (0.4MG Tab Sublingual, Sublingual) Active. Medications Reconciled (verbally with pt; medication present)  Diagnostic Studies History Kendra Mcgrath Reader; 10/15/2016 1:26 PM) Nuclear stress test [08/30/2014]: 1. The resting electrocardiogram demonstrated normal sinus rhythm and normal resting conduction. Poor R wave progression. Stress EKG is non diagnostic for ischemia as it is a pharmacologic stress using Lexiscan. 2. Myocardial perfusion imaging is normal. Overall left ventricular systolic function was normal without regional wall motion abnormalities. The left ventricular ejection fraction was 56%. Carotid Doppler  [11/21/2014]: Mild stenosis in the left external carotid artery (<50%). Follow up in one year is appropriate if clinically indicated. Abdominal Ultrasound [10/08/2014]: Diffuse plaque noted in the proximal, mid and distal aorta. Clinical correlation is suggested. Mild ectasia of the abdominal aorta noted measuring 2.45cm. Clinical correlation recommended. Lower Extremity Arterial Duplex [10/10/2014]: No hemodynamically significant stenoses are identified in the right lower extremity arterial system. Mild diffuse disease in small vessels below the knee bilaterally with > 50% stenosis in the left AT artery This exam reveals normal perfusion of both the lower extremities with bilateral ABI of 0.97. Coronary Angiogram [02/16/2016]: 1st Diag lesion, 50 %stenosed.  2nd Mrg lesion, 80 %stenosed.  Right Acute RV Mrg lesion, mild diffuse disease.  The left ventricular systolic function is normal.    Review of Systems Kendra Maine, FNP-C; 10/15/2016 2:21 PM) General Not Present- Anorexia, Fatigue and Fever. Respiratory Present- Difficulty Breathing on Exertion (stable). Not Present- Cough, Decreased Exercise Tolerance and Dyspnea. Cardiovascular Present- Chest Pain. Not Present- Claudications, Edema, Orthopnea and Paroxysmal Nocturnal Dyspnea. Gastrointestinal Not Present- Change in Bowel Habits, Constipation and Nausea. Neurological Not Present- Focal Neurological Symptoms. Endocrine Not Present- Appetite Changes, Cold Intolerance and Heat Intolerance. Hematology Not Present- Anemia, Petechiae and Prolonged Bleeding.  Vitals Kendra Mcgrath Reader; 10/15/2016 1:34 PM) 10/15/2016 1:34 PM Weight: 192.31 lb Height: 67in Body Surface Area: 1.99 m Body Mass Index: 30.12 kg/m  Pulse: 71 (Regular)  P.OX: 96% (Room air) BP: 130/86 (Sitting, Left Arm, Standard)       Physical Exam Kendra Maine, FNP-C; 10/15/2016 2:21 PM) General Mental Status-Alert. General  Appearance-Cooperative, Appears stated age, Not in acute distress. Orientation-Oriented X3. Build & Nutrition-Well nourished and Moderately built.  Head and Neck Thyroid Gland Characteristics - no palpable nodules, no palpable enlargement.  Chest and Lung Exam Chest and lung exam reveals -quiet, even and easy respiratory effort with no use of accessory muscles  and on auscultation, normal breath sounds, no adventitious sounds.  Cardiovascular Cardiovascular examination reveals -normal heart sounds, regular rate and rhythm with no murmurs, carotid auscultation reveals no bruits, abdominal aorta auscultation reveals no bruits and no prominent pulsation, femoral artery auscultation bilaterally reveals normal pulses, no bruits, no thrills, normal pedal pulses bilaterally and no digital clubbing, cyanosis, edema, increased warmth or tenderness.  Abdomen Palpation/Percussion Normal exam - Non Tender and No hepatosplenomegaly. Auscultation Normal exam - Bowel sounds normal.  Neurologic Motor-Grossly intact without any focal deficits.  Musculoskeletal - Did not examine.    Assessment & Plan Kendra Maine FNP-C; 10/15/2016 2:20 PM) CAD in native artery (I25.10) Story: Coronary Angiography 02/16/2016: 1st Diag lesion, 50%stenosed.  2nd Mrg lesion, 80 %stenosed. Right Acute RV Mrg lesion, mild diffuse disease. The left ventricular systolic function is normal.  Lexiscan myoview stress test 08/30/2014: 1. The resting electrocardiogram demonstrated normal sinus rhythm and normal resting conduction. Poor R wave progression. Stress EKG is non diagnostic for ischemia as it is a pharmacologic stress using Lexiscan. 2. Myocardial perfusion imaging is normal. Overall left ventricular systolic function was normal without regional wall motion abnormalities. The left ventricular ejection fraction was 56%. Impression: EKG 09/24/2016: Normal sinus rhythm at rate of 73 bpm, left axis deviation,  left can't fascicular block. Incomplete right bundle branch block. Poor progression. No evidence of ischemia. No significant change from [facility] 02/16/16.  EKG 08/29/2014: Sinus rhythm at a rate of 60 bpm, left axis deviation, left anterior fascicular block, left atrial abnormality. Poor R-wave progression, cannot exclude anteroseptal ischemia. Future Plans 0/53/9767: METABOLIC PANEL, BASIC (34193) - one time 10/20/2016: CBC & PLATELETS (AUTO) [id number]) - one time 10/20/2016: TSH (THYROID STIMULATING HORMONE) (09735) - one time Benign essential hypertension (I10) Impression: EKG 08/29/2014: Normal sinus rhythm at rate of 59 bpm, borderline left atrial enlargement, left axis deviation, left plantar fascicular block. Poor R-wave progression. Centrilobular emphysema (J43.2) Story: CT chest 07/10/16: No left upper lobe mass. The patient does have emphysematous changes in the upper lobes with pleural and parenchymal scarring Laboratory examination (H29.92) Story: 10/26/2016: TSH 3.8, creatinine 1.24, EGFR 52, potassium 4.2, BMP otherwise normal.  CBC normal.  INR 1.0, prothrombin time 10.3. 08/06/2016: Cholesterol 152, triglycerides 158, HDL 54, LDL 66. Creatinine 0.96, potassium 4.4, CMP normal.  Labs 02/15/2016: BUN 18, serum creatinine 1.08, eGFR 52 mL CMP otherwise normal. TSH normal. CBC normal, Total cholesterol 184, triglycerides 192, HDL 37, LDL 109.  Labs 12/03/2014: Total cholesterol 177, triglycerides 261, HDL 44, LDL 81, LDL particle # 1050, LP-IR score 56, creatinine 0.96, alkaline phosphatase 145, AST 40, ALT 64, CMP otherwise normal, CBC normal  Labs 08/29/2014: Total cholesterol 225, triglycerides 178, HDL 54, LDL 135, LDL particle # 1788, LP-IR score 60 Current Plans Mechanism of underlying disease process and action of medications discussed with the patient. I discussed primary/secondary prevention and also dietary counceling was done. Patient presents for three-week follow-up for angina  pectoris. Patiet was seen 3 weeks ago and discussed indications for coronary angiography the patient is willing to undergo but could not schedule until July. Risk were discussed at that time. I brought her back for close follow-up for close monitoring of symptoms. Symptoms have remained stable, but are still present. She reports exertional chest pain that resolves with rest. At her last office visit, amlodipine was discontinued for lower extremity edema. She continues to have lower extremity edema that has slightly improved since discontinuing the medication. We'll hold off on adding diuretic as symptoms  have improved and for possible worsening of kidney function in view of upcoming procedure with contrast dye. We'll see her back after her procedure for further recommendations and reevaluation.  CC: Antony Contras, MD  Signed electronically by Kendra Maine, FNP-C (10/15/2016 2:22 PM)

## 2016-11-02 ENCOUNTER — Ambulatory Visit (HOSPITAL_COMMUNITY)
Admission: RE | Disposition: A | Discharge status: Home / Self Care | Payer: Self-pay | Source: Ambulatory Visit | Attending: Cardiology

## 2016-11-02 ENCOUNTER — Ambulatory Visit (HOSPITAL_COMMUNITY)
Admission: RE | Admit: 2016-11-02 | Discharge: 2016-11-02 | Disposition: A | Discharge status: Home / Self Care | Payer: Medicare Other | Source: Ambulatory Visit | Attending: Cardiology | Admitting: Cardiology

## 2016-11-02 DIAGNOSIS — G4700 Insomnia, unspecified: Secondary | ICD-10-CM | POA: Diagnosis not present

## 2016-11-02 DIAGNOSIS — Z90710 Acquired absence of both cervix and uterus: Secondary | ICD-10-CM | POA: Diagnosis not present

## 2016-11-02 DIAGNOSIS — I209 Angina pectoris, unspecified: Secondary | ICD-10-CM | POA: Diagnosis present

## 2016-11-02 DIAGNOSIS — Z96642 Presence of left artificial hip joint: Secondary | ICD-10-CM | POA: Diagnosis not present

## 2016-11-02 DIAGNOSIS — Z87891 Personal history of nicotine dependence: Secondary | ICD-10-CM | POA: Diagnosis not present

## 2016-11-02 DIAGNOSIS — I25118 Atherosclerotic heart disease of native coronary artery with other forms of angina pectoris: Secondary | ICD-10-CM | POA: Insufficient documentation

## 2016-11-02 DIAGNOSIS — I10 Essential (primary) hypertension: Secondary | ICD-10-CM | POA: Diagnosis not present

## 2016-11-02 DIAGNOSIS — E782 Mixed hyperlipidemia: Secondary | ICD-10-CM | POA: Diagnosis not present

## 2016-11-02 DIAGNOSIS — E785 Hyperlipidemia, unspecified: Secondary | ICD-10-CM | POA: Insufficient documentation

## 2016-11-02 HISTORY — PX: CORONARY PRESSURE/FFR STUDY: CATH118243

## 2016-11-02 HISTORY — PX: LEFT HEART CATH AND CORONARY ANGIOGRAPHY: CATH118249

## 2016-11-02 LAB — POCT ACTIVATED CLOTTING TIME: Activated Clotting Time: 246 seconds

## 2016-11-02 SURGERY — LEFT HEART CATH AND CORONARY ANGIOGRAPHY
Anesthesia: LOCAL

## 2016-11-02 MED ORDER — MIDAZOLAM HCL 2 MG/2ML IJ SOLN
INTRAMUSCULAR | Status: AC
  Filled 2016-11-02: qty 2

## 2016-11-02 MED ORDER — HEPARIN SODIUM (PORCINE) 1000 UNIT/ML IJ SOLN
INTRAMUSCULAR | Status: AC
  Filled 2016-11-02: qty 1

## 2016-11-02 MED ORDER — SODIUM CHLORIDE 0.9 % WEIGHT BASED INFUSION: 1.0000 mL/kg/h | INTRAVENOUS | Status: DC

## 2016-11-02 MED ORDER — SODIUM CHLORIDE 0.9 % IV SOLN: 250.0000 mL | INTRAVENOUS | Status: DC | PRN

## 2016-11-02 MED ORDER — IOPAMIDOL (ISOVUE-370) INJECTION 76%
INTRAVENOUS | Status: AC
  Filled 2016-11-02: qty 100

## 2016-11-02 MED ORDER — ADENOSINE (DIAGNOSTIC) 140MCG/KG/MIN
INTRAVENOUS | Status: DC | PRN
  Administered 2016-11-02: 140 ug/kg/min via INTRAVENOUS

## 2016-11-02 MED ORDER — SODIUM CHLORIDE 0.9% FLUSH: 3.0000 mL | Freq: Two times a day (BID) | INTRAVENOUS | Status: DC

## 2016-11-02 MED ORDER — IOPAMIDOL (ISOVUE-370) INJECTION 76%
INTRAVENOUS | Status: DC | PRN
  Administered 2016-11-02: 90 mL via INTRA_ARTERIAL

## 2016-11-02 MED ORDER — NITROGLYCERIN 1 MG/10 ML FOR IR/CATH LAB
INTRA_ARTERIAL | Status: DC | PRN
  Administered 2016-11-02: 200 ug via INTRACORONARY

## 2016-11-02 MED ORDER — HYDROMORPHONE HCL 1 MG/ML IJ SOLN
INTRAMUSCULAR | Status: AC
  Filled 2016-11-02: qty 0.5

## 2016-11-02 MED ORDER — NITROGLYCERIN 1 MG/10 ML FOR IR/CATH LAB
INTRA_ARTERIAL | Status: AC
  Filled 2016-11-02: qty 10

## 2016-11-02 MED ORDER — ASPIRIN 81 MG PO CHEW
CHEWABLE_TABLET | ORAL | Status: AC
  Administered 2016-11-02: 81 mg via ORAL
  Filled 2016-11-02: qty 1

## 2016-11-02 MED ORDER — LIDOCAINE HCL (PF) 1 % IJ SOLN
INTRAMUSCULAR | Status: DC | PRN
  Administered 2016-11-02: 2 mL

## 2016-11-02 MED ORDER — HEPARIN (PORCINE) IN NACL 2-0.9 UNIT/ML-% IJ SOLN
INTRAMUSCULAR | Status: AC
  Filled 2016-11-02: qty 1000

## 2016-11-02 MED ORDER — HYDROMORPHONE HCL 1 MG/ML IJ SOLN
INTRAMUSCULAR | Status: DC | PRN
  Administered 2016-11-02 (×2): 0.5 mg via INTRAVENOUS

## 2016-11-02 MED ORDER — SODIUM CHLORIDE 0.9% FLUSH: 3.0000 mL | INTRAVENOUS | Status: DC | PRN

## 2016-11-02 MED ORDER — ADENOSINE 6 MG/2ML IV SOLN
INTRAVENOUS | Status: AC
  Filled 2016-11-02: qty 8

## 2016-11-02 MED ORDER — DIPHENHYDRAMINE HCL 50 MG/ML IJ SOLN
INTRAMUSCULAR | Status: DC | PRN
  Administered 2016-11-02: 25 mg via INTRAVENOUS

## 2016-11-02 MED ORDER — HEPARIN (PORCINE) IN NACL 2-0.9 UNIT/ML-% IJ SOLN
INTRAMUSCULAR | Status: DC | PRN
  Administered 2016-11-02: 08:00:00 via INTRA_ARTERIAL

## 2016-11-02 MED ORDER — MIDAZOLAM HCL 2 MG/2ML IJ SOLN
INTRAMUSCULAR | Status: DC | PRN
  Administered 2016-11-02: 1 mg via INTRAVENOUS
  Administered 2016-11-02: 2 mg via INTRAVENOUS

## 2016-11-02 MED ORDER — SODIUM CHLORIDE 0.9 % WEIGHT BASED INFUSION
3.0000 mL/kg/h | INTRAVENOUS | Status: AC
  Administered 2016-11-02: 3 mL/kg/h via INTRAVENOUS

## 2016-11-02 MED ORDER — VERAPAMIL HCL 2.5 MG/ML IV SOLN
INTRAVENOUS | Status: AC
  Filled 2016-11-02: qty 2

## 2016-11-02 MED ORDER — HEPARIN SODIUM (PORCINE) 1000 UNIT/ML IJ SOLN
INTRAMUSCULAR | Status: DC | PRN
  Administered 2016-11-02: 2000 [IU] via INTRAVENOUS
  Administered 2016-11-02: 3000 [IU] via INTRAVENOUS
  Administered 2016-11-02: 6000 [IU] via INTRAVENOUS

## 2016-11-02 MED ORDER — DIPHENHYDRAMINE HCL 50 MG/ML IJ SOLN
INTRAMUSCULAR | Status: AC
  Filled 2016-11-02: qty 1

## 2016-11-02 MED ORDER — ADENOSINE 12 MG/4ML IV SOLN
INTRAVENOUS | Status: AC
  Filled 2016-11-02: qty 16

## 2016-11-02 MED ORDER — LIDOCAINE HCL (PF) 1 % IJ SOLN
INTRAMUSCULAR | Status: AC
  Filled 2016-11-02: qty 30

## 2016-11-02 MED ORDER — ASPIRIN 81 MG PO CHEW
81.0000 mg | CHEWABLE_TABLET | ORAL | Status: AC
  Administered 2016-11-02: 81 mg via ORAL

## 2016-11-02 MED ORDER — ISOSORBIDE MONONITRATE ER 60 MG PO TB24: 60.0000 mg | ORAL_TABLET | Freq: Every day | ORAL | 1 refills | Status: DC

## 2016-11-02 SURGICAL SUPPLY — 12 items
CATH OPTITORQUE TIG 4.0 5F (CATHETERS) ×2 IMPLANT
CATH VISTA GUIDE 6FR XB3.5 (CATHETERS) ×2 IMPLANT
DEVICE RAD COMP TR BAND LRG (VASCULAR PRODUCTS) ×2 IMPLANT
GLIDESHEATH SLEND A-KIT 6F 20G (SHEATH) ×2 IMPLANT
GUIDEWIRE INQWIRE 1.5J.035X260 (WIRE) ×1 IMPLANT
GUIDEWIRE PRESSURE COMET II (WIRE) ×2 IMPLANT
INQWIRE 1.5J .035X260CM (WIRE) ×2
KIT ENCORE 26 ADVANTAGE (KITS) ×2 IMPLANT
KIT HEART LEFT (KITS) ×2 IMPLANT
PACK CARDIAC CATHETERIZATION (CUSTOM PROCEDURE TRAY) ×2 IMPLANT
TRANSDUCER W/STOPCOCK (MISCELLANEOUS) ×2 IMPLANT
TUBING CIL FLEX 10 FLL-RA (TUBING) ×2 IMPLANT

## 2016-11-02 NOTE — Discharge Instructions (Signed)
Radial Site Care  Refer to this sheet in the next few weeks. These instructions provide you with information about caring for yourself after your procedure. Your health care provider may also give you more specific instructions. Your treatment has been planned according to current medical practices, but problems sometimes occur. Call your health care provider if you have any problems or questions after your procedure. What can I expect after the procedure? After your procedure, it is typical to have the following:  Bruising at the radial site that usually fades within 1-2 weeks.  Blood collecting in the tissue (hematoma) that may be painful to the touch. It should usually decrease in size and tenderness within 1-2 weeks.  Follow these instructions at home:  Take medicines only as directed by your health care provider.  See medication listed in AVS.    You may shower 24 hours after the procedure or as directed by your health care provider. Remove the bandage (dressing) and gently wash the site with plain soap and water. Pat the area dry with a clean towel. Do not rub the site, because this may cause bleeding.  Do not take baths, swim, or use a hot tub until your health care provider approves.  Check your insertion site every day for redness, swelling, or drainage.  Do not apply powder or lotion to the site.  Do not flex or bend the affected arm for 24 hours or as directed by your health care provider.  Do not push or pull heavy objects with the affected arm for 24 hours or as directed by your health care provider.  Do not lift over 10 lb (4.5 kg) for 5 days after your procedure or as directed by your health care provider.  Ask your health care provider when it is okay to: ? Return to work or school. ? Resume usual physical activities or sports. ? Resume sexual activity.  Do not drive home if you are discharged the same day as the procedure. Have someone else drive you.  You may  drive 24 hours after the procedure unless otherwise instructed by your health care provider.  Do not operate machinery or power tools for 24 hours after the procedure.  If your procedure was done as an outpatient procedure, which means that you went home the same day as your procedure, a responsible adult should be with you for the first 24 hours after you arrive home.  Keep all follow-up visits as directed by your health care provider. This is important. Contact a health care provider if:  You have a fever.  You have chills.  You have increased bleeding from the radial site. Hold pressure on the site. Get help right away if:  You have unusual pain at the radial site.  You have redness, warmth, or swelling at the radial site.  You have drainage (other than a small amount of blood on the dressing) from the radial site.  The radial site is bleeding, and the bleeding does not stop after 30 minutes of holding steady pressure on the site.  Your arm or hand becomes pale, cool, tingly, or numb. This information is not intended to replace advice given to you by your health care provider. Make sure you discuss any questions you have with your health care provider. Document Released: 05/15/2010 Document Revised: 09/18/2015 Document Reviewed: 10/29/2013 Elsevier Interactive Patient Education  2018 [employer]

## 2016-11-02 NOTE — Interval H&P Note (Signed)
History and Physical Interval Note:  11/02/2016 7:36 AM  Kendra HarrisPamela Mcgrath  has presented today for surgery, with the diagnosis of cad  The various methods of treatment have been discussed with the patient and family. After consideration of risks, benefits and other options for treatment, the patient has consented to  Procedure(s): Left Heart Cath and Coronary Angiography (N/A) and possible angioplasty as a surgical intervention .  The patient's history has been reviewed, patient examined, no change in status, stable for surgery.  I have reviewed the patient's chart and labs.  Questions were answered to the patient's satisfaction.   Ischemic Symptoms? CCS III (Marked limitation of ordinary activity) Anti-ischemic Medical Therapy? Maximal Medical Therapy (2 or more classes of medications) Non-invasive Test Results? No non-invasive testing performed Prior CABG? No Previous CABG   Patient Information:   1-2V CAD, no prox LAD  A (7)  Indication: 20; Score: 7   Patient Information:   1-2V-CAD with DS 50-60% With No FFR, No IVUS  I (3)  Indication: 21; Score: 3   Patient Information:   1-2V-CAD with DS 50-60% With FFR  A (7)  Indication: 22; Score: 7   Patient Information:   1-2V-CAD with DS 50-60% With FFR>0.8, IVUS not significant  I (2)  Indication: 23; Score: 2   Patient Information:   3V-CAD without LMCA With Abnormal LV systolic function  A (9)  Indication: 48; Score: 9   Patient Information:   LMCA-CAD  A (9)  Indication: 49; Score: 9   Patient Information:   2V-CAD with prox LAD PCI  A (7)  Indication: 62; Score: 7   Patient Information:   2V-CAD with prox LAD CABG  A (8)  Indication: 62; Score: 8   Patient Information:   3V-CAD without LMCA With Low CAD burden(i.e., 3 focal stenoses, low SYNTAX score) PCI  A (7)  Indication: 63; Score: 7   Patient Information:   3V-CAD without LMCA With Low CAD burden(i.e., 3 focal stenoses, low  SYNTAX score) CABG  A (9)  Indication: 63; Score: 9   Patient Information:   3V-CAD without LMCA E06c - Intermediate-high CAD burden (i.e., multiple diffuse lesions, presence of CTO, or high SYNTAX score) PCI  U (4)  Indication: 64; Score: 4   Patient Information:   3V-CAD without LMCA E06c - Intermediate-high CAD burden (i.e., multiple diffuse lesions, presence of CTO, or high SYNTAX score) CABG  A (9)  Indication: 64; Score: 9   Patient Information:   LMCA-CAD With Isolated LMCA stenosis  PCI  U (6)  Indication: 65; Score: 6   Patient Information:   LMCA-CAD With Isolated LMCA stenosis  CABG  A (9)  Indication: 65; Score: 9   Patient Information:   LMCA-CAD Additional CAD, low CAD burden (i.e., 1- to 2-vessel additional involvement, low SYNTAX score) PCI  U (5)  Indication: 66; Score: 5   Patient Information:   LMCA-CAD Additional CAD, low CAD burden (i.e., 1- to 2-vessel additional involvement, low SYNTAX score) CABG  A (9)  Indication: 66; Score: 9   Patient Information:   LMCA-CAD Additional CAD, intermediate-high CAD burden (i.e., 3-vessel involvement, presence of CTO, or high SYNTAX score) PCI  I (3)  Indication: 67; Score: 3   Patient Information:   LMCA-CAD Additional CAD, intermediate-high CAD burden (i.e., 3-vessel involvement, presence of CTO, or high SYNTAX score) CABG  A (9)  Indication: 67; Score: 9   Kendra Mcgrath

## 2016-11-03 ENCOUNTER — Encounter (HOSPITAL_COMMUNITY): Payer: Self-pay | Admitting: Cardiology

## 2016-11-03 MED FILL — Heparin Sodium (Porcine) 2 Unit/ML in Sodium Chloride 0.9%: INTRAMUSCULAR | Qty: 500 | Status: AC

## 2016-11-12 DIAGNOSIS — I2510 Atherosclerotic heart disease of native coronary artery without angina pectoris: Secondary | ICD-10-CM | POA: Diagnosis not present

## 2016-11-12 DIAGNOSIS — I10 Essential (primary) hypertension: Secondary | ICD-10-CM | POA: Diagnosis not present

## 2016-11-12 DIAGNOSIS — E782 Mixed hyperlipidemia: Secondary | ICD-10-CM | POA: Diagnosis not present

## 2016-11-12 DIAGNOSIS — I209 Angina pectoris, unspecified: Secondary | ICD-10-CM | POA: Diagnosis not present

## 2016-11-12 DIAGNOSIS — J432 Centrilobular emphysema: Secondary | ICD-10-CM | POA: Diagnosis not present

## 2017-06-05 ENCOUNTER — Encounter (HOSPITAL_COMMUNITY): Payer: Self-pay

## 2017-06-05 ENCOUNTER — Emergency Department (HOSPITAL_COMMUNITY): Payer: Medicare Other

## 2017-06-05 ENCOUNTER — Emergency Department (HOSPITAL_COMMUNITY)
Admission: EM | Admit: 2017-06-05 | Discharge: 2017-06-05 | Disposition: A | Discharge status: Home / Self Care | Payer: Medicare Other | Attending: Emergency Medicine | Admitting: Emergency Medicine

## 2017-06-05 ENCOUNTER — Other Ambulatory Visit: Payer: Self-pay

## 2017-06-05 DIAGNOSIS — M25552 Pain in left hip: Secondary | ICD-10-CM | POA: Diagnosis not present

## 2017-06-05 DIAGNOSIS — I10 Essential (primary) hypertension: Secondary | ICD-10-CM | POA: Insufficient documentation

## 2017-06-05 DIAGNOSIS — S79912A Unspecified injury of left hip, initial encounter: Secondary | ICD-10-CM | POA: Diagnosis not present

## 2017-06-05 DIAGNOSIS — M62838 Other muscle spasm: Secondary | ICD-10-CM | POA: Diagnosis not present

## 2017-06-05 DIAGNOSIS — F17210 Nicotine dependence, cigarettes, uncomplicated: Secondary | ICD-10-CM | POA: Diagnosis not present

## 2017-06-05 DIAGNOSIS — R001 Bradycardia, unspecified: Secondary | ICD-10-CM | POA: Diagnosis not present

## 2017-06-05 DIAGNOSIS — M542 Cervicalgia: Secondary | ICD-10-CM | POA: Diagnosis present

## 2017-06-05 DIAGNOSIS — Z79899 Other long term (current) drug therapy: Secondary | ICD-10-CM | POA: Diagnosis not present

## 2017-06-05 MED ORDER — METHOCARBAMOL 500 MG PO TABS: 500.0000 mg | ORAL_TABLET | Freq: Two times a day (BID) | ORAL | 0 refills | Status: DC

## 2017-06-05 MED ORDER — NAPROXEN 500 MG PO TABS: 500.0000 mg | ORAL_TABLET | Freq: Two times a day (BID) | ORAL | 0 refills | Status: DC

## 2017-06-05 MED ORDER — HYDROCODONE-ACETAMINOPHEN 5-325 MG PO TABS: 2.0000 | ORAL_TABLET | ORAL | 0 refills | Status: DC | PRN

## 2017-06-05 NOTE — ED Provider Notes (Signed)
[facility] Provider Note   CSN: [id number] Arrival date & time: 06/05/17  1035     History   Chief Complaint No chief complaint on file.   HPI Kendra Mcgrath is a 67 y.o. female.  HPI  Patient with history of hypertension presents with complaint of neck pain primarily on the left side.  She states the pain began gradually several days ago and has been worsening.  There is no injury.  She has pain with moving her head.  She has tried a hot shower and massage without much relief.  She has no weakness of her arms and no tingling or numbness.  She also complains of left hip pain she states the hip pain began 4 weeks ago when she hit it on [place] on an airport shuttle.  She states the pain is worse in different positions and with movement.  She has a hip replacement in that hip which was performed approximately 8 years ago.  She is still able to bear weight on the hip.  She has not had any treatment prior to arrival.   Past Medical History:  Diagnosis Date  . Anemia    hx of   . Anginal pain (HCC)    ongoing for last 4-5 years (reported in 08/2014); had normal stress test 08/30/14 [facility])  . Headache   . History of MRSA infection    2012 per patient on left ear   . Hypercholesterolemia   . Hypertension   . Hypoglycemia   . Lumbar herniated disc   . Osteoarthritis of spine with radiculopathy, lumbar region 04/01/2015  . Pneumonia    hx of pneumonia   . Prolapsed uterus     Patient Active Problem List   Diagnosis Date Noted  . Angina pectoris (HCC) 10/31/2016  . Osteoarthritis of spine with radiculopathy, lumbar region 04/01/2015  . Overweight (BMI 25.0-29.9) 09/11/2014  . [facility]    Past Surgical History:  Procedure Laterality Date  . ANTERIOR HIP REVISION Left 09/09/2014   Procedure: LEFT TOTAL POSTERIOR HIP REVISION;  Surgeon: Durene RomansMatthew Olin, MD;  Location: [facility] ORS;  Service: Orthopedics;  Laterality:  Left;  . CARDIAC CATHETERIZATION N/A 02/16/2016   Procedure: Left Heart Cath and Coronary Angiography;  Surgeon: Yates DecampJay Ganji, MD;  [facility];  Service: Cardiovascular;  Laterality: N/A;  . INTRAVASCULAR PRESSURE WIRE/FFR STUDY N/A 11/02/2016   Procedure: Intravascular Pressure Wire/FFR Study;  Surgeon: Yates DecampGanji, Jay, MD;  [facility];  Service: Cardiovascular;  Laterality: N/A;  . JOINT REPLACEMENT     left hip  . LEFT HEART CATH AND CORONARY ANGIOGRAPHY N/A 11/02/2016   Procedure: Left Heart Cath and Coronary Angiography;  Surgeon: Yates DecampGanji, Jay, MD;  [facility];  Service: Cardiovascular;  Laterality: N/A;  . left hip pinning     . LUMBAR LAMINECTOMY/DECOMPRESSION MICRODISCECTOMY Left 04/01/2015   Procedure: Left lumbar three to lumbar four microdiskectomy Right lumbar four to five discectomy redo;  Surgeon: Coletta MemosKyle Cabbell, MD;  Location: [facility] NEURO ORS;  Service: Neurosurgery;  Laterality: Left;  Left L34 microdiskectomy  . TONSILLECTOMY      OB History    No data available       Home Medications    Prior to Admission medications   Medication Sig Start Date End Date Taking? Authorizing Provider  amLODipine (NORVASC) 5 MG tablet Take 1 tablet (5 mg total) by mouth daily. 02/16/16  Yates Decamp, MD  atorvastatin (LIPITOR) 40 MG tablet Take 1 tablet (40 mg total) by mouth daily. 02/16/16   Yates Decamp, MD  benazepril (LOTENSIN) 20 MG tablet Take 1 tablet (20 mg total) by mouth every morning. 02/16/16   Yates Decamp, MD  buPROPion (WELLBUTRIN SR) 150 MG 12 hr tablet Take 150 mg by mouth daily.     [provider]  diphenhydrAMINE (SOMINEX) 25 MG tablet Take 25 mg by mouth at bedtime as needed for sleep.     [provider]  HYDROcodone-acetaminophen (NORCO/VICODIN) 5-325 MG tablet Take 2 tablets by mouth every 4 (four) hours as needed. 06/05/17   Aurelius Gildersleeve, Latanya Maudlin, MD  isosorbide mononitrate (IMDUR) 60 MG 24 hr tablet Take 1 tablet (60 mg  total) by mouth daily. 11/02/16 01/01/17  Yates Decamp, MD  methocarbamol (ROBAXIN) 500 MG tablet Take 1 tablet (500 mg total) by mouth 2 (two) times daily. 06/05/17   Chanler Schreiter, Latanya Maudlin, MD  metoprolol succinate (TOPROL-XL) 100 MG 24 hr tablet Take 1 tablet (100 mg total) by mouth every morning. Take with or immediately following a meal. 02/16/16   Yates Decamp, MD  Misc Natural Products (FOCUSED MIND PO) Take 1 tablet by mouth daily. FOCUS FACTOR    [provider]  naproxen (NAPROSYN) 500 MG tablet Take 1 tablet (500 mg total) by mouth 2 (two) times daily. 06/05/17   Jocelyn Nold, Latanya Maudlin, MD  naproxen sodium (ALEVE) 220 MG tablet Take 220 mg by mouth 2 (two) times daily as needed (for pain.).     [provider]  nitroGLYCERIN (NITROSTAT) 0.4 MG SL tablet Place 1 tablet (0.4 mg total) under the tongue every 5 (five) minutes as needed for chest pain. 02/16/16   Yates Decamp, MD    Family History Family History  Problem Relation Age of Onset  . Hypertension Father   . Cancer Other     Social History Social History   Tobacco Use  . Smoking status: Current Every Day Smoker    Packs/day: 0.50    Years: 50.00    Pack years: 25.00    Types: Cigarettes  . Smokeless tobacco: Never Used  Substance Use Topics  . Alcohol use: Yes    Comment: rare  . Drug use: No     Allergies   Penicillins and Tetanus toxoid, adsorbed   Review of Systems Review of Systems  ROS reviewed and all otherwise negative except for mentioned in HPI   Physical Exam Updated Vital Signs BP (!) 151/72   Pulse (!) 55   Temp 98.1 F (36.7 C) (Oral)   Resp 16   Ht 5\' 7"  (1.702 m)   Wt 90.7 kg (200 lb)   SpO2 98%   BMI 31.32 kg/m  Vitals reviewed Physical Exam  Physical Examination: General appearance - alert, well appearing, and in no distress Mental status - alert, oriented to person, place, and time Eyes -no conjunctival injection, no scleral icterus Mouth - mucous membranes moist, pharynx normal  without lesions Neck - supple, no significant adenopathy, ttp over left paraspinal muscles, no midline tenderness Chest - clear to auscultation, no wheezes, rales or rhonchi, symmetric air entry Heart - normal rate, regular rhythm, normal S1, S2, no murmurs, rubs, clicks or gallops Neurological - alert, oriented x 3, normal speech, strength 5/5 in upper extremities bilaterally, sensation intact Musculoskeletal - no joint tenderness, deformity or swelling other than ttp over lateral left hip Extremities - peripheral pulses normal, no pedal edema, no clubbing  or cyanosis Skin - normal coloration and turgor, no rashes,    ED Treatments / Results  Labs (all labs ordered are listed, but only abnormal results are displayed) Labs Reviewed - No data to display  EKG  EKG Interpretation  Date/Time:  Sunday June 05 2017 11:20:36 EST Ventricular Rate:  59 PR Interval:  160 QRS Duration: 88 QT Interval:  454 QTC Calculation: 449 R Axis:   -49 Text Interpretation:  Sinus bradycardia Left anterior fascicular block Abnormal ECG No significant change since last tracing Confirmed by Linker, Autumn Pruitt ([id number]) on 06/05/2017 12:06:56 PM       Radiology Dg Hip Unilat W Or Wo Pelvis 2-3 Views Left  Result Date: 06/05/2017 CLINICAL DATA:  Continued left hip pain following injury 4 weeks ago. Initial encounter. EXAM: DG HIP (WITH OR WITHOUT PELVIS) 2-3V LEFT COMPARISON:  11/26/2014 and prior radiographs FINDINGS: Left total hip arthroplasty again noted. There is no evidence of acute fracture, subluxation or dislocation. No hardware complicating features noted. No focal bony lesions are identified. IMPRESSION: 1. No evidence of acute abnormality 2. Left total hip arthroplasty without complicating features. Electronically Signed   By: Jeffrey  Hu M.D.   On: 06/05/2017 12:57    Procedures Procedures (including critical care time)  Medications Ordered in ED Medications - No data to display   Initial  Impression / Assessment and Plan / ED Course  I have reviewed the triage vital signs and the nursing notes.  Pertinent labs & imaging results that were available during my care of the patient were reviewed by me and considered in my medical decision making (see chart for details).     Shunt presenting with complaint of left-sided neck pain over the past 4 days as well as left hip pain for 4 weeks.  Hip x-ray shows hardware in place and no acute abnormalities.  No indication for cervical spine imaging as she has tenderness over paraspinal muscle distribution only.  No neurologic abnormalities on exam.  Patient given prescription for naproxen and Robaxin and a small amount of hydrocodone.  Advised follow-up with her primary doctor and with orthopedics if her hip pain continues. Discharged with strict return precautions.  Pt agreeable with plan.  Final Clinical Impressions(s) / ED Diagnoses   Final diagnoses:  Left hip pain  Cervical paraspinal muscle spasm    ED Discharge Orders        Ordered    naproxen (NAPROSYN) 500 MG tablet  2 times daily     06/05/17 1316    methocarbamol (ROBAXIN) 500 MG tablet  2 times daily     06/05/17 1316    HYDROcodone-acetaminophen (NORCO/VICODIN) 5-325 MG tablet  Every 4 hours PRN     02 /10/19 1316       Kashawn Manzano, Latanya Maudlin, MD 06/05/17 1347

## 2017-06-05 NOTE — ED Triage Notes (Signed)
patient complains of 3 days of left neck pain with any ROM. Reports that she takes her BP daily and has noticed an increase in same. Also has had left hip pain x 4 weeks, states remote replacement, denies trauma

## 2017-06-05 NOTE — Discharge Instructions (Signed)
Return to the ED with any concerns including weakness of arms or legs, chest pain, difficulty breathing, fainting, decreased level of alertness/lethargy, or any other alarming symptoms

## 2017-06-05 NOTE — ED Notes (Signed)
ED Provider at bedside. 

## 2017-06-05 NOTE — ED Notes (Signed)
Patient transported to X-ray 

## 2017-07-29 DIAGNOSIS — M25552 Pain in left hip: Secondary | ICD-10-CM | POA: Diagnosis not present

## 2017-08-05 DIAGNOSIS — I10 Essential (primary) hypertension: Secondary | ICD-10-CM | POA: Diagnosis not present

## 2017-08-05 DIAGNOSIS — I2510 Atherosclerotic heart disease of native coronary artery without angina pectoris: Secondary | ICD-10-CM | POA: Diagnosis not present

## 2017-08-05 DIAGNOSIS — F339 Major depressive disorder, recurrent, unspecified: Secondary | ICD-10-CM | POA: Diagnosis not present

## 2017-08-05 DIAGNOSIS — N183 Chronic kidney disease, stage 3 (moderate): Secondary | ICD-10-CM | POA: Diagnosis not present

## 2017-08-05 DIAGNOSIS — E7800 Pure hypercholesterolemia, unspecified: Secondary | ICD-10-CM | POA: Diagnosis not present

## 2017-08-05 DIAGNOSIS — E039 Hypothyroidism, unspecified: Secondary | ICD-10-CM | POA: Diagnosis not present

## 2017-08-05 DIAGNOSIS — Z1389 Encounter for screening for other disorder: Secondary | ICD-10-CM | POA: Diagnosis not present

## 2017-08-05 DIAGNOSIS — M25552 Pain in left hip: Secondary | ICD-10-CM | POA: Diagnosis not present

## 2017-08-05 DIAGNOSIS — R609 Edema, unspecified: Secondary | ICD-10-CM | POA: Diagnosis not present

## 2017-08-12 DIAGNOSIS — M25552 Pain in left hip: Secondary | ICD-10-CM | POA: Diagnosis not present

## 2017-08-12 DIAGNOSIS — M545 Low back pain: Secondary | ICD-10-CM | POA: Diagnosis not present

## 2017-08-12 DIAGNOSIS — Z96642 Presence of left artificial hip joint: Secondary | ICD-10-CM | POA: Diagnosis not present

## 2017-09-02 DIAGNOSIS — M25552 Pain in left hip: Secondary | ICD-10-CM | POA: Diagnosis not present

## 2017-09-16 DIAGNOSIS — M533 Sacrococcygeal disorders, not elsewhere classified: Secondary | ICD-10-CM | POA: Diagnosis not present

## 2017-09-16 DIAGNOSIS — M5136 Other intervertebral disc degeneration, lumbar region: Secondary | ICD-10-CM | POA: Diagnosis not present

## 2017-09-16 DIAGNOSIS — M549 Dorsalgia, unspecified: Secondary | ICD-10-CM | POA: Diagnosis not present

## 2017-09-16 DIAGNOSIS — M1990 Unspecified osteoarthritis, unspecified site: Secondary | ICD-10-CM | POA: Diagnosis not present

## 2017-09-16 DIAGNOSIS — M25552 Pain in left hip: Secondary | ICD-10-CM | POA: Diagnosis not present

## 2017-09-16 DIAGNOSIS — M5030 Other cervical disc degeneration, unspecified cervical region: Secondary | ICD-10-CM | POA: Diagnosis not present

## 2017-09-23 DIAGNOSIS — E039 Hypothyroidism, unspecified: Secondary | ICD-10-CM | POA: Diagnosis not present

## 2017-09-30 DIAGNOSIS — E782 Mixed hyperlipidemia: Secondary | ICD-10-CM | POA: Diagnosis not present

## 2017-09-30 DIAGNOSIS — J432 Centrilobular emphysema: Secondary | ICD-10-CM | POA: Diagnosis not present

## 2017-09-30 DIAGNOSIS — I10 Essential (primary) hypertension: Secondary | ICD-10-CM | POA: Diagnosis not present

## 2017-09-30 DIAGNOSIS — I25119 Atherosclerotic heart disease of native coronary artery with unspecified angina pectoris: Secondary | ICD-10-CM | POA: Diagnosis not present

## 2018-01-13 ENCOUNTER — Other Ambulatory Visit: Payer: Self-pay

## 2018-01-13 ENCOUNTER — Emergency Department (HOSPITAL_COMMUNITY)
Admission: EM | Admit: 2018-01-13 | Discharge: 2018-01-13 | Disposition: A | Discharge status: Home / Self Care | Payer: Medicare Other | Attending: Emergency Medicine | Admitting: Emergency Medicine

## 2018-01-13 ENCOUNTER — Emergency Department (HOSPITAL_COMMUNITY): Payer: Medicare Other

## 2018-01-13 ENCOUNTER — Emergency Department (HOSPITAL_COMMUNITY): Admission: EM | Admit: 2018-01-13 | Payer: Self-pay | Source: Ambulatory Visit | Admitting: Emergency Medicine

## 2018-01-13 ENCOUNTER — Encounter (HOSPITAL_COMMUNITY)
Admission: EM | Disposition: A | Discharge status: Home / Self Care | Payer: Self-pay | Source: Home / Self Care | Attending: Emergency Medicine

## 2018-01-13 ENCOUNTER — Emergency Department (HOSPITAL_COMMUNITY): Payer: Medicare Other | Admitting: Certified Registered Nurse Anesthetist

## 2018-01-13 ENCOUNTER — Encounter (HOSPITAL_COMMUNITY): Payer: Self-pay | Admitting: Emergency Medicine

## 2018-01-13 DIAGNOSIS — Y92009 Unspecified place in unspecified non-institutional (private) residence as the place of occurrence of the external cause: Secondary | ICD-10-CM | POA: Diagnosis not present

## 2018-01-13 DIAGNOSIS — F17210 Nicotine dependence, cigarettes, uncomplicated: Secondary | ICD-10-CM | POA: Insufficient documentation

## 2018-01-13 DIAGNOSIS — S63296A Dislocation of distal interphalangeal joint of right little finger, initial encounter: Secondary | ICD-10-CM | POA: Insufficient documentation

## 2018-01-13 DIAGNOSIS — Z880 Allergy status to penicillin: Secondary | ICD-10-CM | POA: Insufficient documentation

## 2018-01-13 DIAGNOSIS — M25562 Pain in left knee: Secondary | ICD-10-CM | POA: Diagnosis not present

## 2018-01-13 DIAGNOSIS — E7800 Pure hypercholesterolemia, unspecified: Secondary | ICD-10-CM | POA: Insufficient documentation

## 2018-01-13 DIAGNOSIS — T84021A Dislocation of internal left hip prosthesis, initial encounter: Secondary | ICD-10-CM | POA: Diagnosis not present

## 2018-01-13 DIAGNOSIS — S61209A Unspecified open wound of unspecified finger without damage to nail, initial encounter: Secondary | ICD-10-CM

## 2018-01-13 DIAGNOSIS — Y792 Prosthetic and other implants, materials and accessory orthopedic devices associated with adverse incidents: Secondary | ICD-10-CM | POA: Insufficient documentation

## 2018-01-13 DIAGNOSIS — Z96642 Presence of left artificial hip joint: Secondary | ICD-10-CM | POA: Diagnosis not present

## 2018-01-13 DIAGNOSIS — Z79899 Other long term (current) drug therapy: Secondary | ICD-10-CM | POA: Insufficient documentation

## 2018-01-13 DIAGNOSIS — S73005A Unspecified dislocation of left hip, initial encounter: Secondary | ICD-10-CM

## 2018-01-13 DIAGNOSIS — W19XXXA Unspecified fall, initial encounter: Secondary | ICD-10-CM | POA: Diagnosis not present

## 2018-01-13 DIAGNOSIS — Z8614 Personal history of Methicillin resistant Staphylococcus aureus infection: Secondary | ICD-10-CM | POA: Insufficient documentation

## 2018-01-13 DIAGNOSIS — W010XXA Fall on same level from slipping, tripping and stumbling without subsequent striking against object, initial encounter: Secondary | ICD-10-CM | POA: Insufficient documentation

## 2018-01-13 DIAGNOSIS — R0902 Hypoxemia: Secondary | ICD-10-CM | POA: Diagnosis not present

## 2018-01-13 DIAGNOSIS — S8992XA Unspecified injury of left lower leg, initial encounter: Secondary | ICD-10-CM | POA: Diagnosis not present

## 2018-01-13 DIAGNOSIS — S63286A Dislocation of proximal interphalangeal joint of right little finger, initial encounter: Secondary | ICD-10-CM | POA: Diagnosis not present

## 2018-01-13 DIAGNOSIS — Z01818 Encounter for other preprocedural examination: Secondary | ICD-10-CM | POA: Diagnosis not present

## 2018-01-13 DIAGNOSIS — S63259A Unspecified dislocation of unspecified finger, initial encounter: Secondary | ICD-10-CM

## 2018-01-13 DIAGNOSIS — I10 Essential (primary) hypertension: Secondary | ICD-10-CM | POA: Insufficient documentation

## 2018-01-13 DIAGNOSIS — Z419 Encounter for procedure for purposes other than remedying health state, unspecified: Secondary | ICD-10-CM

## 2018-01-13 DIAGNOSIS — M79646 Pain in unspecified finger(s): Secondary | ICD-10-CM | POA: Diagnosis not present

## 2018-01-13 DIAGNOSIS — R52 Pain, unspecified: Secondary | ICD-10-CM | POA: Diagnosis not present

## 2018-01-13 HISTORY — PX: HIP CLOSED REDUCTION: SHX983

## 2018-01-13 LAB — CBC WITH DIFFERENTIAL/PLATELET
BASOS ABS: 0 10*3/uL (ref 0.0–0.1)
BASOS PCT: 0 %
EOS ABS: 0.1 10*3/uL (ref 0.0–0.7)
EOS PCT: 1 %
HEMATOCRIT: 39.6 % (ref 36.0–46.0)
Hemoglobin: 13.6 g/dL (ref 12.0–15.0)
LYMPHS PCT: 24 %
Lymphs Abs: 1.6 10*3/uL (ref 0.7–4.0)
MCH: 30.6 pg (ref 26.0–34.0)
MCHC: 34.3 g/dL (ref 30.0–36.0)
MCV: 89.2 fL (ref 78.0–100.0)
MONO ABS: 0.4 10*3/uL (ref 0.1–1.0)
Monocytes Relative: 6 %
Neutro Abs: 4.6 10*3/uL (ref 1.7–7.7)
Neutrophils Relative %: 69 %
PLATELETS: 232 10*3/uL (ref 150–400)
RBC: 4.44 MIL/uL (ref 3.87–5.11)
RDW: 13.3 % (ref 11.5–15.5)
WBC: 6.7 10*3/uL (ref 4.0–10.5)

## 2018-01-13 LAB — BASIC METABOLIC PANEL
ANION GAP: 9 (ref 5–15)
BUN: 16 mg/dL (ref 8–23)
CO2: 26 mmol/L (ref 22–32)
CREATININE: 1.04 mg/dL — AB (ref 0.44–1.00)
Calcium: 9.5 mg/dL (ref 8.9–10.3)
Chloride: 105 mmol/L (ref 98–111)
GFR, EST NON AFRICAN AMERICAN: 54 mL/min — AB (ref >60)
Glucose, Bld: 96 mg/dL (ref 70–99)
Potassium: 3.9 mmol/L (ref 3.5–5.1)
SODIUM: 140 mmol/L (ref 135–145)

## 2018-01-13 SURGERY — CLOSED MANIPULATION, JOINT, HIP
Anesthesia: General | Laterality: Left

## 2018-01-13 MED ORDER — LIDOCAINE 2% (20 MG/ML) 5 ML SYRINGE
INTRAMUSCULAR | Status: DC | PRN
  Administered 2018-01-13: 60 mg via INTRAVENOUS

## 2018-01-13 MED ORDER — PROPOFOL 10 MG/ML IV BOLUS
INTRAVENOUS | Status: AC
  Filled 2018-01-13: qty 20

## 2018-01-13 MED ORDER — FENTANYL CITRATE (PF) 100 MCG/2ML IJ SOLN
INTRAMUSCULAR | Status: DC | PRN
  Administered 2018-01-13: 50 ug via INTRAVENOUS
  Administered 2018-01-13: 25 ug via INTRAVENOUS

## 2018-01-13 MED ORDER — ONDANSETRON HCL 4 MG/2ML IJ SOLN: 4.0000 mg | Freq: Once | INTRAMUSCULAR | Status: DC | PRN

## 2018-01-13 MED ORDER — ONDANSETRON HCL 4 MG/2ML IJ SOLN
INTRAMUSCULAR | Status: DC | PRN
  Administered 2018-01-13: 4 mg via INTRAVENOUS

## 2018-01-13 MED ORDER — FENTANYL CITRATE (PF) 100 MCG/2ML IJ SOLN: 25.0000 ug | INTRAMUSCULAR | Status: DC | PRN

## 2018-01-13 MED ORDER — LACTATED RINGERS IV SOLN: INTRAVENOUS | Status: DC

## 2018-01-13 MED ORDER — LIDOCAINE 2% (20 MG/ML) 5 ML SYRINGE
INTRAMUSCULAR | Status: AC
  Filled 2018-01-13: qty 5

## 2018-01-13 MED ORDER — PROPOFOL 10 MG/ML IV BOLUS
INTRAVENOUS | Status: AC | PRN
  Administered 2018-01-13: 20 mg via INTRAVENOUS

## 2018-01-13 MED ORDER — PROPOFOL 10 MG/ML IV BOLUS
INTRAVENOUS | Status: AC | PRN
  Administered 2018-01-13: 40 mg via INTRAVENOUS
  Administered 2018-01-13 (×2): 20 mg via INTRAVENOUS

## 2018-01-13 MED ORDER — ONDANSETRON HCL 4 MG/2ML IJ SOLN
INTRAMUSCULAR | Status: AC
  Filled 2018-01-13: qty 2

## 2018-01-13 MED ORDER — PROPOFOL 10 MG/ML IV BOLUS
INTRAVENOUS | Status: DC | PRN
  Administered 2018-01-13: 190 mg via INTRAVENOUS

## 2018-01-13 MED ORDER — CLINDAMYCIN HCL 300 MG PO CAPS: 300.0000 mg | ORAL_CAPSULE | Freq: Four times a day (QID) | ORAL | 0 refills | Status: DC

## 2018-01-13 MED ORDER — LACTATED RINGERS IV SOLN
INTRAVENOUS | Status: DC | PRN
  Administered 2018-01-13: 20:00:00 via INTRAVENOUS

## 2018-01-13 MED ORDER — FENTANYL CITRATE (PF) 100 MCG/2ML IJ SOLN
INTRAMUSCULAR | Status: AC
  Filled 2018-01-13: qty 2

## 2018-01-13 MED ORDER — CLINDAMYCIN HCL 300 MG PO CAPS
300.0000 mg | ORAL_CAPSULE | Freq: Once | ORAL | Status: AC
  Administered 2018-01-13: 300 mg via ORAL
  Filled 2018-01-13: qty 1

## 2018-01-13 MED ORDER — FENTANYL CITRATE (PF) 100 MCG/2ML IJ SOLN
100.0000 ug | Freq: Once | INTRAMUSCULAR | Status: AC
  Administered 2018-01-13: 100 ug via INTRAVENOUS
  Filled 2018-01-13: qty 2

## 2018-01-13 SURGICAL SUPPLY — 19 items
BANDAGE ADH SHEER 1  50/CT (GAUZE/BANDAGES/DRESSINGS) IMPLANT
COVER SURGICAL LIGHT HANDLE (MISCELLANEOUS) IMPLANT
DURAPREP 26ML APPLICATOR (WOUND CARE) IMPLANT
GAUZE SPONGE 4X4 12PLY STRL (GAUZE/BANDAGES/DRESSINGS) IMPLANT
GLOVE BIOGEL PI IND STRL 7.5 (GLOVE) IMPLANT
GLOVE BIOGEL PI IND STRL 8.5 (GLOVE) IMPLANT
GLOVE BIOGEL PI INDICATOR 7.5 (GLOVE)
GLOVE BIOGEL PI INDICATOR 8.5 (GLOVE)
GLOVE ECLIPSE 8.0 STRL XLNG CF (GLOVE) IMPLANT
GLOVE ORTHO TXT STRL SZ7.5 (GLOVE) IMPLANT
GLOVE SURG ORTHO 8.0 STRL STRW (GLOVE) IMPLANT
GOWN SPEC L3 XXLG W/TWL (GOWN DISPOSABLE) IMPLANT
GOWN STRL REUS W/TWL LRG LVL3 (GOWN DISPOSABLE) IMPLANT
MANIFOLD NEPTUNE II (INSTRUMENTS) IMPLANT
NDL SAFETY ECLIPSE 18X1.5 (NEEDLE) IMPLANT
NEEDLE HYPO 18GX1.5 SHARP (NEEDLE)
POSITIONER SURGICAL ARM (MISCELLANEOUS) IMPLANT
SYR CONTROL 10ML LL (SYRINGE) IMPLANT
TOWEL OR 17X26 10 PK STRL BLUE (TOWEL DISPOSABLE) IMPLANT

## 2018-01-13 NOTE — Anesthesia Postprocedure Evaluation (Addendum)
Anesthesia Post Note  Patient: Shirl Harrisamela Kulakowski  Procedure(s) Performed: CLOSED MANIPULATION HIP (Left )     Patient location during evaluation: PACU Anesthesia Type: General Level of consciousness: awake and alert Pain management: pain level controlled Vital Signs Assessment: post-procedure vital signs reviewed and stable Respiratory status: spontaneous breathing, nonlabored ventilation, respiratory function stable and patient connected to nasal cannula oxygen Cardiovascular status: blood pressure returned to baseline and stable Postop Assessment: no apparent nausea or vomiting Anesthetic complications: no    Last Vitals:  Vitals:   01/13/18 2114 01/13/18 2115  BP:  (!) 160/78  Pulse: 60 (!) 58  Resp: 16 14  Temp:    SpO2: 100% 100%    Last Pain:  Vitals:   01/13/18 2105  TempSrc:   PainSc: 3                  Jontavius Rabalais P Jrake Rodriquez

## 2018-01-13 NOTE — Consult Note (Signed)
ORTHOPAEDIC CONSULTATION  REQUESTING PHYSICIAN: Loren Racer, MD  PCP:  Tally Joe, MD  Chief Complaint: Dislocated left total hip arthroplasty  HPI: Kendra Mcgrath is a 67 y.o. female who fell at home on her left knee, and she felt her left hip dislocate.  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  Per the emergency department, she has an open dislocation of the right small finger DIP joint.  They have consulted hand surgery.  Orthopedic consultation was placed for management of dislocated left total hip replacement.  The emergency department staff had difficulty sedating the patient in the emergency department due to dropping O2 saturation.  They requested closed reduction in the operating room.  Past Medical History:  Diagnosis Date  . Anemia    hx of   . Anginal pain (HCC)    ongoing for last 4-5 years (reported in 08/2014); had normal stress test 08/30/14 [facility])  . Headache   . History of MRSA infection    2012 per patient on left ear   . Hypercholesterolemia   . Hypertension   . Hypoglycemia   . Lumbar herniated disc   . Osteoarthritis of spine with radiculopathy, lumbar region 04/01/2015  . Pneumonia    hx of pneumonia   . Prolapsed uterus    Past Surgical History:  Procedure Laterality Date  . ANTERIOR HIP REVISION Left 09/09/2014   Procedure: LEFT TOTAL POSTERIOR HIP REVISION;  Surgeon: Durene Romans, MD;  Location: [facility] ORS;  Service: Orthopedics;  Laterality: Left;  . CARDIAC CATHETERIZATION N/A 02/16/2016   Procedure: Left Heart Cath and Coronary Angiography;  Surgeon: Yates Decamp, MD;  Location: [facility] INVASIVE [facility] LAB;  Service: Cardiovascular;  Laterality: N/A;  . INTRAVASCULAR PRESSURE WIRE/FFR STUDY N/A 11/02/2016   Procedure: Intravascular Pressure Wire/FFR Study;  Surgeon: Yates Decamp, MD;  Location[facility] INVASIVE CV LAB;  Service: Cardiovascular;  Laterality: N/A;  . JOINT REPLACEMENT     left hip  . LEFT HEART CATH AND CORONARY ANGIOGRAPHY N/A 11/02/2016   Procedure: Left Heart Cath and Coronary Angiography;  Surgeon: Yates Decamp, MD;  Location: [facility] INVASIVE CV LAB;  Service: Cardiovascular;  Laterality: N/A;  . left hip pinning     . LUMBAR LAMINECTOMY/DECOMPRESSION MICRODISCECTOMY Left 04/01/2015   Procedure: Left lumbar three to lumbar four microdiskectomy Right lumbar four to five discectomy redo;  Surgeon: Coletta Memos, MD;  Location: [facility] NEURO ORS;  Service: Neurosurgery;  Laterality: Left;  Left L34 microdiskectomy  . TONSILLECTOMY     Social History   Socioeconomic History  . Marital status: Married    Spouse name: Not on file  . Number of children: Not on file  . Years of education: Not on file  . Highest education level: Not on file  Occupational History  . Not on file  Social Needs  . Financial resource strain: Not on file  . Food insecurity:    Worry: Not on file    Inability: Not on file  . Transportation needs:    Medical: Not on file    Non-medical: Not on file  Tobacco Use  . Smoking status: Current Every Day Smoker    Packs/day: 0.50    Years: 50.00    Pack years: 25.00    Types: Cigarettes  . Smokeless tobacco: Never Used  Substance and Sexual Activity  . Alcohol use: Yes    Comment: rare  . Drug use: No  .  Sexual activity: Not on file  Lifestyle  . Physical activity:    Days per week: Not on file    Minutes per session: Not on file  . Stress: Not on file  Relationships  . Social connections:    Talks on phone: Not on file    Gets together: Not on file    Attends religious service: Not on file    Active member of club or organization: Not on file    Attends meetings of clubs or organizations: Not on file    Relationship status: Not on file  Other Topics Concern  . Not on file  Social History Narrative  . Not on file   Family History  Problem Relation Age of Onset  . Hypertension Father   . Cancer Other    Allergies  Allergen  Reactions  . Penicillins Hives    Itching and rash Has patient had a PCN reaction causing immediate rash, facial/tongue/throat swelling, SOB or lightheadedness with hypotension: YES Has patient had a PCN reaction causing severe rash involving mucus membranes or skin necrosis: NO Has patient had a PCN reaction that required hospitalization NO Has patient had a PCN reaction occurring within the last 10 years: NO If all of the above answers are "NO", then may proceed with Cephalosporin use.   . Tetanus Toxoid, Adsorbed Hives    Itching and rash    Prior to Admission medications   Medication Sig Start Date End Date Taking? Authorizing Provider  benazepril (LOTENSIN) 20 MG tablet Take 1 tablet (20 mg total) by mouth every morning. 02/16/16  Yes Yates DecampGanji, Jay, MD  Bioflavonoid Products (BIOFLEX PO) Take 1 tablet by mouth daily.   Yes [provider]  buPROPion (WELLBUTRIN SR) 150 MG 12 hr tablet Take 150 mg by mouth daily. 04/15/15  Yes [provider]  diphenhydrAMINE (BENADRYL) 25 MG tablet Take 50 mg by mouth at bedtime.   Yes [provider]  levothyroxine (SYNTHROID, LEVOTHROID) 25 MCG tablet Take 25 mcg by mouth daily. 12/20/17  Yes [provider]  metoprolol succinate (TOPROL-XL) 100 MG 24 hr tablet Take 1 tablet (100 mg total) by mouth every morning. Take with or immediately following a meal. 02/16/16  Yes Yates DecampGanji, Jay, MD  Misc Natural Products (FOCUSED MIND PO) Take 1 tablet by mouth daily. FOCUS FACTOR   Yes [provider]  RANEXA 1000 MG SR tablet Take 1,000 mg by mouth daily. 11/10/17  Yes [provider]  amLODipine (NORVASC) 5 MG tablet Take 1 tablet (5 mg total) by mouth daily. Patient not taking: Reported on 01/13/2018 02/16/16   Yates DecampGanji, Jay, MD  atorvastatin (LIPITOR) 40 MG tablet Take 1 tablet (40 mg total) by mouth daily. Patient not taking: Reported on 01/13/2018 02/16/16   Yates DecampGanji, Jay, MD  HYDROcodone-acetaminophen (NORCO/VICODIN)  5-325 MG tablet Take 2 tablets by mouth every 4 (four) hours as needed. Patient not taking: Reported on 01/13/2018 06/05/17   Phillis HaggisMabe, Martha L, MD  isosorbide mononitrate (IMDUR) 60 MG 24 hr tablet Take 1 tablet (60 mg total) by mouth daily. Patient not taking: Reported on 01/13/2018 11/02/16 01/01/17  Yates DecampGanji, Jay, MD  methocarbamol (ROBAXIN) 500 MG tablet Take 1 tablet (500 mg total) by mouth 2 (two) times daily. Patient not taking: Reported on 01/13/2018 06/05/17   Phillis HaggisMabe, Martha L, MD  naproxen (NAPROSYN) 500 MG tablet Take 1 tablet (500 mg total) by mouth 2 (two) times daily. Patient not taking: Reported on 01/13/2018 06/05/17   Mabe, Latanya MaudlinMartha L,  MD  nitroGLYCERIN (NITROSTAT) 0.4 MG SL tablet Place 1 tablet (0.4 mg total) under the tongue every 5 (five) minutes as needed for chest pain. 02/16/16   Yates Decamp, MD   Dg Chest Port 1 View  Result Date: 01/13/2018 CLINICAL DATA:  Preoperative respiratory evaluation. EXAM: PORTABLE CHEST 1 VIEW COMPARISON:  CT chest 07/09/2016.  Chest x-ray 06/02/2016. FINDINGS: 1825 hours. Patient rotated to the right. The lungs are clear without focal pneumonia, edema, pneumothorax or pleural effusion. Cardiopericardial silhouette is at upper limits of normal for size. The visualized bony structures of the thorax are intact. Telemetry leads overlie the chest. IMPRESSION: No active disease. Electronically Signed   By: Kennith Center M.D.   On: 01/13/2018 18:57   Dg Knee Complete 4 Views Left  Result Date: 01/13/2018 CLINICAL DATA:  Patient tripped and fell.  Left hip and knee pain. EXAM: LEFT KNEE - COMPLETE 4+ VIEW COMPARISON:  Hip radiographs same date. No previous knee radiographs. FINDINGS: Positioning is suboptimal due to the patient's left total hip arthroplasty dislocation. Three views were obtained with the knee flexed on all views. No evidence of acute fracture or dislocation. The joint spaces appear preserved. There is possible mild soft tissue edema anteriorly, but no large  joint effusion. IMPRESSION: No acute findings are identified at the left knee. Positioning is suboptimal due to the patient's dislocated left total hip arthroplasty. If clinically warranted, additional imaging after relocating the left hip could be obtained. Electronically Signed   By: Carey Bullocks M.D.   On: 01/13/2018 17:30   Dg Hand Complete Right  Result Date: 01/13/2018 CLINICAL DATA:  Patient tripped and fell onto her left side at home per EMS. Patient has a 2 previous hip replacements. Patient has some deformity to her right pinky finger. Patient has shortening of the left leg per EMS EXAM: RIGHT HAND - COMPLETE 3+ VIEW COMPARISON:  None. FINDINGS: DIP joint of the fifth finger is dislocated with the distal phalanx dislocating posteriorly. No convincing fracture of the fifth digit. Remaining joints are normally aligned. Ill-defined calcification or ossification lies along the radial margin of the second metacarpophalangeal joint, presumably chronic. No bone lesions. Soft tissues are unremarkable. IMPRESSION: 1. Dorsal dislocation of the DIP joint of the fifth digit. No convincing fracture. Electronically Signed   By: Amie Portland M.D.   On: 01/13/2018 17:30   Dg Hip Unilat W Or Wo Pelvis 2-3 Views Left  Result Date: 01/13/2018 CLINICAL DATA:  Fall EXAM: DG HIP (WITH OR WITHOUT PELVIS) 2-3V LEFT COMPARISON:  06/05/2017, 11/26/2014 FINDINGS: Pubic symphysis and rami appear intact. Prior left hip replacement. Superior and lateral dislocation of the left femoral component with respect to the acetabular cup. No gross fracture seen. IMPRESSION: Status post left hip replacement with superior and lateral dislocation of the left femoral component. Electronically Signed   By: Jasmine Pang M.D.   On: 01/13/2018 17:24    Positive ROS: All other systems have been reviewed and were otherwise negative with the exception of those mentioned in the HPI and as above.  Physical Exam: General: Alert, no acute  distress Cardiovascular: No pedal edema Respiratory: No cyanosis, no use of accessory musculature GI: No organomegaly, abdomen is soft and non-tender Skin: No lesions in the area of chief complaint Neurologic: Sensation intact distally Psychiatric: Patient is competent for consent with normal mood and affect Lymphatic: No axillary or cervical lymphadenopathy  MUSCULOSKELETAL: Examination of the left hip reveals a healed posterior incision.  She is shortened  and rotated.  She is able to wiggle her toes.  She reports intact sensation.  She has a palpable pulse.  Assessment: Dislocated left total hip arthroplasty. Open right small finger DIP dislocation per hand surgery.  Plan: I discussed the findings with the patient.  I recommended close reduction of the left hip in the operating room.  We discussed the risk, benefits, and alternatives.  She understands that there is a risk of recurrent instability.  After today's procedure, she will be in a knee immobilizer.  She may weight-bear as tolerated.  Posterior hip precautions.  She will need to call the office to schedule an appointment with Dr. [name] in 2 weeks.  All questions were solicited and answered.    Jonette Pesa, MD Cell [phone]    01/13/2018 8:18 PM

## 2018-01-13 NOTE — ED Notes (Signed)
Bed: WA09 Expected date:  Expected time:  Means of arrival:  Comments: coming back from PACU to sew up hand

## 2018-01-13 NOTE — Op Note (Signed)
OPERATIVE REPORT   01/13/2018  8:43 PM  PATIENT:  Kendra Mcgrath   SURGEON:  Bertram Savin, MD  ASSISTANT: Staff.   PREOPERATIVE DIAGNOSIS: Dislocated left total hip arthroplasty.  POSTOPERATIVE DIAGNOSIS:  Same.  PROCEDURE: Closed reduction of left hip  ANESTHESIA:   MAC.  ANTIBIOTICS: None were indicated.  IMPLANTS: None.  SPECIMENS: None.  COMPLICATIONS: None.  DISPOSITION: Stable to PACU.  SURGICAL INDICATIONS:  [name] is a 67 y.o. female with a diagnosis of dislocated left total hip arthroplasty after revision acetabular component of left hip by Dr. Alvan Dame in 2016.  She fell directly onto her knee earlier today, and her left hip dislocated.  She also sustained an open dislocation of the right small finger DIP joint, and hand surgery was consulted.  The risks, benefits, and alternatives were discussed with the patient preoperatively including but not limited to the risks of infection, bleeding, nerve / blood vessel injury, recurrent instability, cardiopulmonary complications, the need for repeat surgery, among others, and the patient was willing to proceed.  PROCEDURE IN DETAIL: Identified the patient holding area using 2 identifiers.  The surgical site was marked by myself.  She was taken to the operating room, and monitored anesthesia care was established on the stretcher.  A timeout was called, verifying site and site of surgery.  Antibiotics were not given, as none were indicated.  Using a single reduction maneuver I applied traction and rotation.  The hip reduced with an audible and palpable clunk.  Stability testing was performed, and the hip was extremely stable.  Portable AP pelvis x-ray was performed in the operating room confirming reduction.  A knee immobilizer was placed.  The patient was then aroused from anesthesia and taken to the PACU in stable condition.  POSTOPERATIVE PLAN: Postoperatively, the patient may weight-bear as tolerated left lower  extremity.  Wear knee immobilizer at all times.  Posterior hip precautions.  She will be proceeding back to the emergency department for continued treatment of her right small finger.  From an orthopedic standpoint, she can be discharged home when ready.  She will need to call the office as soon as possible to schedule a follow-up appointment with Dr. Alvan Dame in 2 weeks.

## 2018-01-13 NOTE — Progress Notes (Signed)
Returned patient Patient to ED. Stable condition on stretcher

## 2018-01-13 NOTE — Sedation Documentation (Addendum)
Dr.Yelverton, Respiratory therapist, Milon Dikesrtho Tech, and Psychologist, educationalMandy RN at bedside. Dr.Yelverton attempting to reset hip.

## 2018-01-13 NOTE — ED Notes (Signed)
ED Provider at bedside. 

## 2018-01-13 NOTE — Transfer of Care (Signed)
Immediate Anesthesia Transfer of Care Note  Patient: Kendra Mcgrath  Procedure(s) Performed: CLOSED MANIPULATION HIP (Left )  Patient Location: PACU  Anesthesia Type:General  Level of Consciousness: awake, alert  and patient cooperative  Airway & Oxygen Therapy: Patient Spontanous Breathing and Patient connected to face mask oxygen  Post-op Assessment: Report given to RN and Post -op Vital signs reviewed and stable  Post vital signs: Reviewed and stable  Last Vitals:  Vitals Value Taken Time  BP 147/93 01/13/2018  8:55 PM  Temp    Pulse 67 01/13/2018  8:58 PM  Resp 17 01/13/2018  8:58 PM  SpO2 100 % 01/13/2018  8:58 PM  Vitals shown include unvalidated device data.  Last Pain:  Vitals:   01/13/18 1938  TempSrc:   PainSc: 10-Worst pain ever         Complications: No apparent anesthesia complications

## 2018-01-13 NOTE — Sedation Documentation (Signed)
Reduction unsuccessful, Dr.Yelverton will consult surgery.

## 2018-01-13 NOTE — Discharge Instructions (Addendum)
Wear knee immobilizer at all times. Weight bearing as tolerated left leg. Posterior hip precautions. Call [phone] on Monday morning to schedule a follow up appointment with Dr. Charlann Boxerlin in 2 weeks.

## 2018-01-13 NOTE — Sedation Documentation (Signed)
Patient is now alert and [facility].

## 2018-01-13 NOTE — ED Provider Notes (Addendum)
[facility]-EMERGENCY DEPT Provider Note   CSN: [id number] Arrival date & time: 01/13/18  1558     History   Chief Complaint Chief Complaint  Patient presents with  . Fall  . Finger Injury  . Hip Pain    HPI Kendra Mcgrath is a 67 y.o. female.  HPI Patient has previous left hip replacement and revision surgeries.  States she tripped and fell onto her left knee and felt immediate dislocation of the left hip.  This occurred around 2 PM today.  She denies hitting her head or loss of consciousness.  She is been unable to ambulate since the fall.  Denies any new numbness or weakness.  Patient also sustained laceration to the fifth digit of the right hand while attempting to catch herself.  EMS placed bandage.  Patient states she has not eaten today.  She had coffee this morning at 5 AM. Past Medical History:  Diagnosis Date  . Anemia    hx of   . Anginal pain (HCC)    ongoing for last 4-5 years (reported in 08/2014); had normal stress test 08/30/14 [facility] CV)  . Headache   . History of MRSA infection    2012 per patient on left ear   . Hypercholesterolemia   . Hypertension   . Hypoglycemia   . Lumbar herniated disc   . Osteoarthritis of spine with radiculopathy, lumbar region 04/01/2015  . Pneumonia    hx of pneumonia   . Prolapsed uterus     Patient Active Problem List   Diagnosis Date Noted  . Angina pectoris (HCC) 10/31/2016  . Osteoarthritis of spine with radiculopathy, lumbar region 04/01/2015  . Overweight (BMI 25.0-29.9) 09/11/2014  . S/P revision of left [facility] 09/09/2014    Past Surgical History:  Procedure Laterality Date  . ANTERIOR HIP REVISION Left 09/09/2014   Procedure: LEFT TOTAL POSTERIOR HIP REVISION;  Surgeon: Durene Romans, MD;  Location: [facility] ORS;  Service: Orthopedics;  Laterality: Left;  . CARDIAC CATHETERIZATION N/A 02/16/2016   Procedure: Left Heart Cath and Coronary Angiography;  Surgeon: Yates Decamp, MD;  Location: [facility] INVASIVE CV  LAB;  Service: Cardiovascular;  Laterality: N/A;  . INTRAVASCULAR PRESSURE WIRE/FFR STUDY N/A 11/02/2016   Procedure: Intravascular Pressure Wire/FFR Study;  Surgeon: Yates Decamp, MD;  Location: [facility] INVASIVE CV LAB;  Service: Cardiovascular;  Laterality: N/A;  . JOINT REPLACEMENT     left hip  . LEFT HEART CATH AND CORONARY ANGIOGRAPHY N/A 11/02/2016   Procedure: Left Heart Cath and Coronary Angiography;  Surgeon: Yates Decamp, MD;  Location: [facility] INVASIVE CV LAB;  Service: Cardiovascular;  Laterality: N/A;  . left hip pinning     . LUMBAR LAMINECTOMY/DECOMPRESSION MICRODISCECTOMY Left 04/01/2015   Procedure: Left lumbar three to lumbar four microdiskectomy Right lumbar four to five discectomy redo;  Surgeon: Coletta Memos, MD;  Location: [facility] NEURO ORS;  Service: Neurosurgery;  Laterality: Left;  Left L34 microdiskectomy  . TONSILLECTOMY       OB History   None      Home Medications    Prior to Admission medications   Medication Sig Start Date End Date Taking? Authorizing Provider  benazepril (LOTENSIN) 20 MG tablet Take 1 tablet (20 mg total) by mouth every morning. 02/16/16  Yes Yates Decamp, MD  Bioflavonoid Products (BIOFLEX PO) Take 1 tablet by mouth daily.   Yes [provider]  buPROPion (WELLBUTRIN SR) 150 MG 12 hr tablet Take 150 mg by mouth daily. 04/15/15  Yes [provider]  diphenhydrAMINE (BENADRYL) 25 MG tablet Take 50 mg by mouth at bedtime.   Yes [provider]  levothyroxine (SYNTHROID, LEVOTHROID) 25 MCG tablet Take 25 mcg by mouth daily. 12/20/17  Yes [provider]  metoprolol succinate (TOPROL-XL) 100 MG 24 hr tablet Take 1 tablet (100 mg total) by mouth every morning. Take with or immediately following a meal. 02/16/16  Yes Yates Decamp, MD  Misc Natural Products (FOCUSED MIND PO) Take 1 tablet by mouth daily. FOCUS FACTOR   Yes [provider]  RANEXA 1000 MG SR tablet Take 1,000 mg by mouth daily. 11/10/17  Yes [provider]  amLODipine (NORVASC) 5 MG tablet Take 1 tablet (5 mg total) by mouth daily. Patient not taking: Reported on 01/13/2018 02/16/16   Yates Decamp, MD  atorvastatin (LIPITOR) 40 MG tablet Take 1 tablet (40 mg total) by mouth daily. Patient not taking: Reported on 01/13/2018 02/16/16   Yates Decamp, MD  clindamycin (CLEOCIN) 300 MG capsule Take 1 capsule (300 mg total) by mouth 4 (four) times daily. X 7 days 01/13/18   Loren Racer, MD  HYDROcodone-acetaminophen (NORCO/VICODIN) 5-325 MG tablet Take 2 tablets by mouth every 4 (four) hours as needed. Patient not taking: Reported on 01/13/2018 06/05/17   Phillis Haggis, MD  isosorbide mononitrate (IMDUR) 60 MG 24 hr tablet Take 1 tablet (60 mg total) by mouth daily. Patient not taking: Reported on 01/13/2018 11/02/16 01/01/17  Yates Decamp, MD  methocarbamol (ROBAXIN) 500 MG tablet Take 1 tablet (500 mg total) by mouth 2 (two) times daily. Patient not taking: Reported on 01/13/2018 06/05/17   Phillis Haggis, MD  naproxen (NAPROSYN) 500 MG tablet Take 1 tablet (500 mg total) by mouth 2 (two) times daily. Patient not taking: Reported on 01/13/2018 06/05/17   Phillis Haggis, MD  nitroGLYCERIN (NITROSTAT) 0.4 MG SL tablet Place 1 tablet (0.4 mg total) under the tongue every 5 (five) minutes as needed for chest pain. 02/16/16   Yates Decamp, MD    Family History Family History  Problem Relation Age of Onset  . Hypertension Father   . Cancer Other     Social History Social History   Tobacco Use  . Smoking status: Current Every Day Smoker    Packs/day: 0.50    Years: 50.00    Pack years: 25.00    Types: Cigarettes  . Smokeless tobacco: Never Used  Substance Use Topics  . Alcohol use: Yes    Comment: rare  . Drug use: No     Allergies   Penicillins and Tetanus toxoid, adsorbed   Review of Systems Review of Systems  Constitutional: Negative for chills and fever.  HENT: Negative for sore throat and trouble swallowing.   Respiratory:  Negative for cough and shortness of breath.   Cardiovascular: Negative for chest pain and leg swelling.  Gastrointestinal: Negative for abdominal pain, nausea and vomiting.  Genitourinary: Negative for dysuria and flank pain.  Musculoskeletal: Positive for arthralgias. Negative for back pain and neck pain.  Skin: Positive for wound.  Neurological: Negative for dizziness, syncope, weakness, numbness and headaches.  All other systems reviewed and are negative.    Physical Exam Updated Vital Signs BP (!) 181/95 (BP Location: Right Arm)   Pulse 63   Temp 98.7 F (37.1 C)   Resp 16   Ht 5\' 8"  (1.727 m)   Wt 86.2 kg   SpO2 99%   BMI 28.89 kg/m   Physical Exam  Constitutional:  She is oriented to person, place, and time. She appears well-developed and well-nourished. No distress.  HENT:  Head: Normocephalic and atraumatic.  Mouth/Throat: Oropharynx is clear and moist.  No obvious head injury.  No intraoral trauma.  Eyes: Pupils are equal, round, and reactive to light. EOM are normal.  Neck: Normal range of motion. Neck supple.  No posterior midline cervical tenderness to palpation.  Cardiovascular: Normal rate and regular rhythm. Exam reveals no gallop and no friction rub.  No murmur heard. Pulmonary/Chest: Effort normal and breath sounds normal. No respiratory distress. She has no wheezes. She has no rales.  Abdominal: Soft. Bowel sounds are normal. She exhibits no distension and no mass. There is no tenderness. There is no rebound and no guarding. No hernia.  Musculoskeletal: She exhibits no edema.  Left lower extremity is shortened.  Unable to range left hip due to pain.  Dorsalis pedis and posterior tibial pulses are intact.  No definite tenderness to palpation of the left knee the patient has difficulty with range of motion due to pain in the left hip.  Neurological: She is alert and oriented to person, place, and time.  Moving toes freely bilaterally.  Distal lower extremity  sensation fully intact.  Skin: Skin is warm and dry. No rash noted. She is not diaphoretic. No erythema.  Patient has a 1cm laceration over the volar surface of the fifth digit at approximately the DIP joint. There is no active bleeding or contamination.  Psychiatric: She has a normal mood and affect. Her behavior is normal.  Nursing note and vitals reviewed.    ED Treatments / Results  Labs (all labs ordered are listed, but only abnormal results are displayed) Labs Reviewed  BASIC METABOLIC PANEL - Abnormal; Notable for the following components:      Result Value   Creatinine, Ser 1.04 (*)    GFR calc non Af Amer 54 (*)    All other components within normal limits  CBC WITH DIFFERENTIAL/PLATELET    EKG None  Radiology Dg Chest Port 1 View  Result Date: 01/13/2018 CLINICAL DATA:  Preoperative respiratory evaluation. EXAM: PORTABLE CHEST 1 VIEW COMPARISON:  CT chest 07/09/2016.  Chest x-ray 06/02/2016. FINDINGS: 1825 hours. Patient rotated to the right. The lungs are clear without focal pneumonia, edema, pneumothorax or pleural effusion. Cardiopericardial silhouette is at upper limits of normal for size. The visualized bony structures of the thorax are intact. Telemetry leads overlie the chest. IMPRESSION: No active disease. Electronically Signed   By: Kennith Center M.D.   On: 01/13/2018 18:57   Dg Knee Complete 4 Views Left  Result Date: 01/13/2018 CLINICAL DATA:  Patient tripped and fell.  Left hip and knee pain. EXAM: LEFT KNEE - COMPLETE 4+ VIEW COMPARISON:  Hip radiographs same date. No previous knee radiographs. FINDINGS: Positioning is suboptimal due to the patient's left total hip arthroplasty dislocation. Three views were obtained with the knee flexed on all views. No evidence of acute fracture or dislocation. The joint spaces appear preserved. There is possible mild soft tissue edema anteriorly, but no large joint effusion. IMPRESSION: No acute findings are identified at the  left knee. Positioning is suboptimal due to the patient's dislocated left total hip arthroplasty. If clinically warranted, additional imaging after relocating the left hip could be obtained. Electronically Signed   By: Carey Bullocks M.D.   On: 01/13/2018 17:30   Dg Hand Complete Right  Result Date: 01/13/2018 CLINICAL DATA:  Post reduction of fifth DIP joint dislocation.  EXAM: RIGHT HAND - COMPLETE 3+ VIEW COMPARISON:  None. FINDINGS: There has been satisfactory reduction of the right fifth DIP joint dislocation. Soft tissue swelling with either skin fold or possible laceration along the volar aspect of the fifth digit is noted. No definite fracture. The remainder of the study is stable. IMPRESSION: Satisfactory reduction of previously dislocated right fifth DIP joint. No apparent fracture post reduction. Electronically Signed   By: Tollie Eth M.D.   On: 01/13/2018 22:46   Dg Hand Complete Right  Result Date: 01/13/2018 CLINICAL DATA:  Patient tripped and fell onto her left side at home per EMS. Patient has a 2 previous hip replacements. Patient has some deformity to her right pinky finger. Patient has shortening of the left leg per EMS EXAM: RIGHT HAND - COMPLETE 3+ VIEW COMPARISON:  None. FINDINGS: DIP joint of the fifth finger is dislocated with the distal phalanx dislocating posteriorly. No convincing fracture of the fifth digit. Remaining joints are normally aligned. Ill-defined calcification or ossification lies along the radial margin of the second metacarpophalangeal joint, presumably chronic. No bone lesions. Soft tissues are unremarkable. IMPRESSION: 1. Dorsal dislocation of the DIP joint of the fifth digit. No convincing fracture. Electronically Signed   By: Amie Portland M.D.   On: 01/13/2018 17:30   Dg Hip Operative Unilat W Or W/o Pelvis Left  Result Date: 01/13/2018 CLINICAL DATA:  Closed reduction of left hip dislocation. EXAM: OPERATIVE left HIP (WITH PELVIS IF PERFORMED) single-view  TECHNIQUE: AP view centered over the hips. COMPARISON:  01/13/2018 radiographs 1637 hours FINDINGS: The dislocated femoral head has been reduced and is now anatomic with the acetabular component on the left. The native right hip is intact. No fracture is seen. IMPRESSION: Successful reduction of dislocated left hip arthroplasty. Electronically Signed   By: Tollie Eth M.D.   On: 01/13/2018 21:21   Dg Hip Unilat W Or Wo Pelvis 2-3 Views Left  Result Date: 01/13/2018 CLINICAL DATA:  Fall EXAM: DG HIP (WITH OR WITHOUT PELVIS) 2-3V LEFT COMPARISON:  06/05/2017, 11/26/2014 FINDINGS: Pubic symphysis and rami appear intact. Prior left hip replacement. Superior and lateral dislocation of the left femoral component with respect to the acetabular cup. No gross fracture seen. IMPRESSION: Status post left hip replacement with superior and lateral dislocation of the left femoral component. Electronically Signed   By: Jasmine Pang M.D.   On: 01/13/2018 17:24    Procedures Reduction of dislocation Date/Time: 01/13/2018 6:00 PM Performed by: Loren Racer, MD Authorized by: Loren Racer, MD  Consent: Written consent obtained. Risks and benefits: risks, benefits and alternatives were discussed Consent given by: patient Imaging studies: imaging studies available Time out: Immediately prior to procedure a "time out" was called to verify the correct patient, procedure, equipment, support staff and site/side marked as required.  Sedation: Patient sedated: yes Sedation type: moderate (conscious) sedation Sedatives: propofol Sedation start date/time: 01/13/2018 6:03 PM Sedation end date/time: 01/13/2018 6:30 PM Vitals: Vital signs were monitored during sedation.  Patient tolerance: Patient tolerated the procedure well with no immediate complications Comments: Unable to get adequate sedation without patient becoming hypoxic.  Unsuccessful hip reduction    (including critical care time)  Medications  Ordered in ED Medications  clindamycin (CLEOCIN) capsule 300 mg (has no administration in time range)  fentaNYL (SUBLIMAZE) injection 100 mcg (100 mcg Intravenous Given 01/13/18 1730)  propofol (DIPRIVAN) 10 mg/mL bolus/IV push (20 mg Intravenous Given 01/13/18 1810)  propofol (DIPRIVAN) 10 mg/mL bolus/IV push (20 mg Intravenous Given  01/13/18 1813)     Initial Impression / Assessment and Plan / ED Course  I have reviewed the triage vital signs and the nursing notes.  Pertinent labs & imaging results that were available during my care of the patient were reviewed by me and considered in my medical decision making (see chart for details).     Patient states she has allergies to tetanus shots and is refusing update. Attempted reduction of left hip in the emergency department.  Patient did not tolerate sedation.  Became hypoxic into the 80s requiring jaw thrust maneuver to improve oxygenation.  Right fifth digit reduced.  Discussed with Dr. Linna CapriceSwinteck.  Will take to the OR to reduce the hip.  Also spoke with Dr. Blaine HamperWeinstein regarding apparent open dislocation of the right fifth digit.  Suggested placing in a splint in slight flexion, starting antibiotics and following up.  Patient refusing suture placement.  Given first dose of clindamycin due to penicillin allergy.  Strict return precautions given. Final Clinical Impressions(s) / ED Diagnoses   Final diagnoses:  Open dislocation of finger, initial encounter  Closed dislocation of left hip, initial encounter [facility](HCC)    ED Discharge Orders         Ordered    clindamycin (CLEOCIN) 300 MG capsule  4 times daily     01/13/18 2258           Loren RacerYelverton, Jazzmyne Rasnick, MD 01/13/18 2303    Loren RacerYelverton, Annalysse Shoemaker, MD 01/13/18 2306

## 2018-01-13 NOTE — Sedation Documentation (Signed)
Oxygen turned up from 2L to 4L nasal canula.

## 2018-01-13 NOTE — ED Notes (Signed)
Bed: WA09 Expected date: 01/13/18 Expected time:  Means of arrival:  Comments: [email or web address]

## 2018-01-13 NOTE — ED Triage Notes (Signed)
Patient arrived by EMS from home. Patient tripped and fell onto her left side at home per EMS. Patient has a 2 previous hip replacements. Patient has some deformity to her right pinky finger. Patient has shortening of the lft. leg per EMS. Pelvic binder in place by EMS. EMS gave 200 mcg of fentanyl. 18 gauge in lft. AC. Patient has no loss of consciousness. 130/84, 80, 96, 18.

## 2018-01-13 NOTE — Anesthesia Preprocedure Evaluation (Addendum)
Anesthesia Evaluation  Patient identified by MRN, date of birth, ID band Patient awake    Reviewed: Allergy & Precautions, NPO status , Patient's Chart, lab work & pertinent test results  Airway Mallampati: III  TM Distance: >3 FB Neck ROM: Full    Dental  (+) Edentulous Upper, Edentulous Lower   Pulmonary Current Smoker,    Pulmonary exam normal breath sounds clear to auscultation       Cardiovascular hypertension, Pt. on medications and Pt. 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, LAFB  Sees cardiologist (Ganji)  vasospasm   Neuro/Psych  Headaches, PSYCHIATRIC DISORDERS    GI/Hepatic negative GI ROS, Neg liver ROS,   Endo/Other  Hypothyroidism   Renal/GU negative Renal ROS     Musculoskeletal  (+) Arthritis ,   Abdominal   Peds  Hematology HLD   Anesthesia Other Findings dislocated hip  Reproductive/Obstetrics                            Anesthesia Physical Anesthesia Plan  ASA: IV and emergent  Anesthesia Plan: General   Post-op Pain Management:    Induction: Intravenous  PONV Risk Score and Plan: 2 and Treatment may vary due to age or medical condition  Airway Management Planned: Mask  Additional Equipment:   Intra-op Plan:   Post-operative Plan:   Informed Consent: I have reviewed the patients History and Physical, chart, labs and discussed the procedure including the risks, benefits and alternatives for the proposed anesthesia with the patient or authorized representative who has indicated his/her understanding and acceptance.     Plan Discussed with: CRNA  Anesthesia Plan Comments:       Anesthesia Quick Evaluation

## 2018-01-13 NOTE — Sedation Documentation (Signed)
Jaw thrust was performed.

## 2018-01-14 ENCOUNTER — Encounter (HOSPITAL_COMMUNITY): Payer: Self-pay | Admitting: Orthopedic Surgery

## 2018-04-07 DIAGNOSIS — I25119 Atherosclerotic heart disease of native coronary artery with unspecified angina pectoris: Secondary | ICD-10-CM | POA: Diagnosis not present

## 2018-04-07 DIAGNOSIS — J432 Centrilobular emphysema: Secondary | ICD-10-CM | POA: Diagnosis not present

## 2018-04-07 DIAGNOSIS — I10 Essential (primary) hypertension: Secondary | ICD-10-CM | POA: Diagnosis not present

## 2018-04-07 DIAGNOSIS — E782 Mixed hyperlipidemia: Secondary | ICD-10-CM | POA: Diagnosis not present

## 2018-07-14 ENCOUNTER — Other Ambulatory Visit: Payer: Self-pay | Admitting: Cardiology

## 2018-09-04 ENCOUNTER — Other Ambulatory Visit: Payer: Self-pay | Admitting: Cardiology

## 2018-09-04 MED ORDER — RANEXA 1000 MG PO TB12: 1000.0000 mg | ORAL_TABLET | Freq: Two times a day (BID) | ORAL | 3 refills | Status: DC

## 2018-09-12 DIAGNOSIS — E039 Hypothyroidism, unspecified: Secondary | ICD-10-CM | POA: Diagnosis not present

## 2018-09-12 DIAGNOSIS — Z1159 Encounter for screening for other viral diseases: Secondary | ICD-10-CM | POA: Diagnosis not present

## 2018-09-12 DIAGNOSIS — Z1211 Encounter for screening for malignant neoplasm of colon: Secondary | ICD-10-CM | POA: Diagnosis not present

## 2018-09-12 DIAGNOSIS — I10 Essential (primary) hypertension: Secondary | ICD-10-CM | POA: Diagnosis not present

## 2018-09-12 DIAGNOSIS — F339 Major depressive disorder, recurrent, unspecified: Secondary | ICD-10-CM | POA: Diagnosis not present

## 2018-09-12 DIAGNOSIS — E7800 Pure hypercholesterolemia, unspecified: Secondary | ICD-10-CM | POA: Diagnosis not present

## 2018-09-12 DIAGNOSIS — M25552 Pain in left hip: Secondary | ICD-10-CM | POA: Diagnosis not present

## 2018-09-12 DIAGNOSIS — R609 Edema, unspecified: Secondary | ICD-10-CM | POA: Diagnosis not present

## 2018-09-12 DIAGNOSIS — E2839 Other primary ovarian failure: Secondary | ICD-10-CM | POA: Diagnosis not present

## 2018-09-12 DIAGNOSIS — N183 Chronic kidney disease, stage 3 (moderate): Secondary | ICD-10-CM | POA: Diagnosis not present

## 2018-09-12 DIAGNOSIS — Z0000 Encounter for general adult medical examination without abnormal findings: Secondary | ICD-10-CM | POA: Diagnosis not present

## 2018-09-12 DIAGNOSIS — I2510 Atherosclerotic heart disease of native coronary artery without angina pectoris: Secondary | ICD-10-CM | POA: Diagnosis not present

## 2018-09-19 ENCOUNTER — Other Ambulatory Visit: Payer: Self-pay | Admitting: Cardiology

## 2018-09-19 MED ORDER — BUPROPION HCL ER (SR) 150 MG PO TB12: 150.0000 mg | ORAL_TABLET | Freq: Every day | ORAL | 3 refills | Status: DC

## 2018-10-02 ENCOUNTER — Other Ambulatory Visit: Payer: Self-pay | Admitting: Family Medicine

## 2018-10-02 DIAGNOSIS — E2839 Other primary ovarian failure: Secondary | ICD-10-CM

## 2018-10-03 DIAGNOSIS — Z1211 Encounter for screening for malignant neoplasm of colon: Secondary | ICD-10-CM | POA: Diagnosis not present

## 2018-10-03 DIAGNOSIS — E039 Hypothyroidism, unspecified: Secondary | ICD-10-CM | POA: Diagnosis not present

## 2018-10-03 DIAGNOSIS — E7800 Pure hypercholesterolemia, unspecified: Secondary | ICD-10-CM | POA: Diagnosis not present

## 2018-10-03 DIAGNOSIS — Z1159 Encounter for screening for other viral diseases: Secondary | ICD-10-CM | POA: Diagnosis not present

## 2018-10-04 DIAGNOSIS — R600 Localized edema: Secondary | ICD-10-CM | POA: Diagnosis not present

## 2018-10-06 ENCOUNTER — Encounter: Payer: Self-pay | Admitting: Cardiology

## 2018-10-06 ENCOUNTER — Ambulatory Visit (INDEPENDENT_AMBULATORY_CARE_PROVIDER_SITE_OTHER): Payer: Medicare Other | Admitting: Cardiology

## 2018-10-06 ENCOUNTER — Other Ambulatory Visit: Payer: Self-pay

## 2018-10-06 VITALS — BP 146/101 | HR 64 | Ht 67.0 in | Wt 195.0 lb

## 2018-10-06 DIAGNOSIS — I129 Hypertensive chronic kidney disease with stage 1 through stage 4 chronic kidney disease, or unspecified chronic kidney disease: Secondary | ICD-10-CM | POA: Diagnosis not present

## 2018-10-06 DIAGNOSIS — N183 Chronic kidney disease, stage 3 unspecified: Secondary | ICD-10-CM

## 2018-10-06 DIAGNOSIS — J432 Centrilobular emphysema: Secondary | ICD-10-CM

## 2018-10-06 DIAGNOSIS — Z87891 Personal history of nicotine dependence: Secondary | ICD-10-CM | POA: Diagnosis not present

## 2018-10-06 DIAGNOSIS — I739 Peripheral vascular disease, unspecified: Secondary | ICD-10-CM

## 2018-10-06 DIAGNOSIS — I25118 Atherosclerotic heart disease of native coronary artery with other forms of angina pectoris: Secondary | ICD-10-CM

## 2018-10-06 DIAGNOSIS — E782 Mixed hyperlipidemia: Secondary | ICD-10-CM

## 2018-10-06 DIAGNOSIS — I10 Essential (primary) hypertension: Secondary | ICD-10-CM

## 2018-10-06 MED ORDER — BENAZEPRIL-HYDROCHLOROTHIAZIDE 20-25 MG PO TABS: 1.0000 | ORAL_TABLET | ORAL | 0 refills | Status: DC

## 2018-10-06 MED ORDER — AMLODIPINE BESYLATE 5 MG PO TABS: 5.0000 mg | ORAL_TABLET | Freq: Every day | ORAL | 1 refills | Status: DC

## 2018-10-06 NOTE — Progress Notes (Signed)
Virtual Visit via Video Note: This visit type was conducted due to national recommendations for restrictions regarding the COVID-19 Pandemic (e.g. social distancing).  This format is felt to be most appropriate for this patient at this time.  All issues noted in this document were discussed and addressed.  No physical exam was performed (except for noted visual exam findings with Telehealth visits).  The patient has consented to conduct a Telehealth visit and understands insurance will be billed.   I connected with@, on 10/06/18 at  by a video enabled telemedicine application and verified that I am speaking with the correct person using two identifiers.   I discussed the limitations of evaluation and management by telemedicine and the availability of in person appointments. The patient expressed understanding and agreed to proceed.   I have discussed with patient regarding the safety during COVID Pandemic and steps and precautions to be taken including social distancing, frequent hand wash and use of detergent soap, gels with the patient. I asked the patient to avoid touching mouth, nose, eyes, ears with the hands. I encouraged regular walking around the neighborhood and exercise and regular diet, as long as social distancing can be maintained.  Primary Physician/Referring:  Tally JoeSwayne, David, MD  Patient ID: Kendra Mcgrath, female    DOB: 03/22/1951, 68 y.o.   MRN: [id number]  Chief Complaint  Patient presents with   Coronary Artery Disease    HPI: Kendra Harrisamela Jankowski  is a 68 y.o. 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 She has chronic stable angina and has responded well to Ranexa. By coronary angiography on 11/02/2016 which essentially revealed a 50% stenosis (-ve FFR) in the marginal vessel which is improved compared to previous and no significant  disease was evident in the diagonal. She also uses NTG with relief. With renaxa she has had very minimal anginal symptoms.   On her last visit 776-months ago, she was working with bariatric program and has lost 12 pounds in weight and was also trying phentermine. Stopped Phenteramine 3 months ago.   Her complaint today is leg claudication, also continues to have persistent leg swelling, was seen by vein specialist and was recommended lower extremity arterial duplex in view of abnormal physical exam.   She has weakness, cramping and pain in both the legs, bilateral hips as well.  Toe cramping on standing.   Past Medical History:  Diagnosis Date   Anemia    hx of    Anginal pain (HCC)    ongoing for last 4-5 years (reported in 08/2014); had normal stress test 08/30/14 [facility]   Headache    History of MRSA infection    2012 per patient on left ear    Hypercholesterolemia    Hypertension    Hypoglycemia    Lumbar herniated disc    Osteoarthritis of spine with radiculopathy, lumbar region 04/01/2015   Pneumonia    hx of pneumonia    Prolapsed uterus     Past Surgical History:  Procedure Laterality Date   ANTERIOR HIP REVISION Left 09/09/2014   Procedure: LEFT TOTAL POSTERIOR HIP REVISION;  Surgeon: Durene RomansMatthew Olin, MD;  Location: [facility];  Service: Orthopedics;  Laterality: Left;   CARDIAC CATHETERIZATION N/A 02/16/2016   Procedure: Left Heart Cath and Coronary Angiography;  Surgeon: Yates DecampJay Symon Norwood, MD;  [facility];  Service: Cardiovascular;  Laterality: N/A;  HIP CLOSED REDUCTION Left 01/13/2018   Procedure: CLOSED MANIPULATION HIP;  Surgeon: Rod Can, MD;  Location: [facility];  Service: Orthopedics;  Laterality: Left;   INTRAVASCULAR PRESSURE WIRE/FFR STUDY N/A 11/02/2016   Procedure: Intravascular Pressure Wire/FFR Study;  Surgeon: Adrian Prows, MD;  Location: [facility];  Service: Cardiovascular;  Laterality: N/A;   JOINT REPLACEMENT     left hip    LEFT HEART CATH AND CORONARY ANGIOGRAPHY N/A 11/02/2016   Procedure: Left Heart Cath and Coronary Angiography;  Surgeon: Adrian Prows, MD;  Location: [facility];  Service: Cardiovascular;  Laterality: N/A;   left hip pinning      LUMBAR LAMINECTOMY/DECOMPRESSION MICRODISCECTOMY Left 04/01/2015   Procedure: Left lumbar three to lumbar four microdiskectomy Right lumbar four to five discectomy redo;  Surgeon: Ashok Pall, MD;  Location: [facility] [facility];  Service: Neurosurgery;  Laterality: Left;  Left L34 microdiskectomy   TONSILLECTOMY      Social History   Socioeconomic History   Marital status: Married    Spouse name: Not on file   Number of children: 5   Years of education: Not on file   Highest education level: Not on file  Occupational History   Not on file  Social Needs   Financial resource strain: Not on file   Food insecurity    Worry: Not on file    Inability: Not on file   Transportation needs    Medical: Not on file    Non-medical: Not on file  Tobacco Use   Smoking status: Former Smoker    Packs/day: 0.50    Years: 50.00    Pack years: 25.00    Types: Cigarettes   Smokeless tobacco: Never Used   Tobacco comment: YEAR AGO  Substance and Sexual Activity   Alcohol use: Yes    Comment: rare   Drug use: No   Sexual activity: Not on file  Lifestyle   Physical activity    Days per week: Not on file    Minutes per session: Not on file   Stress: Not on file  Relationships   Social connections    Talks on phone: Not on file    Gets together: Not on file    Attends religious service: Not on file    Active member of club or organization: Not on file    Attends meetings of clubs or organizations: Not on file    Relationship status: Not on file   Intimate partner violence    Fear of current or ex partner: Not on file    Emotionally abused: Not on file    Physically abused: Not on file    Forced sexual activity: Not on file  Other Topics  Concern   Not on file  Social History Narrative   Not on file   Review of Systems  Constitution: Negative for chills, decreased appetite, malaise/fatigue and weight gain.  Cardiovascular: Positive for chest pain, claudication (both leg and hips), dyspnea on exertion and leg swelling (left leg worse). Negative for syncope.  Endocrine: Negative for cold intolerance.  Hematologic/Lymphatic: Does not bruise/bleed easily.  Musculoskeletal: Negative for joint swelling.  Gastrointestinal: Negative for abdominal pain, anorexia, change in bowel habit, hematochezia and melena.  Neurological: Negative for headaches and light-headedness.  Psychiatric/Behavioral: Negative for depression and substance abuse.  All other systems reviewed and are negative.     Objective  Blood pressure (!) 146/101, pulse 64, height 5\' 7"  (1.702 m), weight 195 lb (88.5  kg). Body mass index is 30.54 kg/m. Physical exam not performed or limited due to virtual visit.  Patient appeared to be in no distress, Neck was supple, respiration was not labored.  Please see exam details from prior visit is as below.    Physical Exam  Constitutional: She appears well-developed. No distress.  Mildly obese  HENT:  Head: Atraumatic.  Eyes: Conjunctivae are normal.  Neck: Neck supple. No JVD present. No thyromegaly present.  Cardiovascular: Normal rate, regular rhythm, normal heart sounds, intact distal pulses and normal pulses. Exam reveals no gallop.  No murmur heard. Bilateral varicose veins noted, 2+ pitting edema.  Pulmonary/Chest: Effort normal and breath sounds normal.  Abdominal: Soft. Bowel sounds are normal.  Musculoskeletal: Normal range of motion.  Neurological: She is alert.  Skin: Skin is warm and dry.  Psychiatric: She has a normal mood and affect.   Radiology: No results found.  Laboratory examination:   CMP Latest Ref Rng & Units 01/13/2018 02/15/2016 02/14/2016  Glucose 70 - 99 mg/dL 96 91 045(W114(H)  BUN 8  - 23 mg/dL 16 18 18   Creatinine 0.44 - 1.00 mg/dL 0.98(J1.04(H) 1.91(Y1.08(H) 7.82(N1.12(H)  Sodium 135 - 145 mmol/L 140 139 136  Potassium 3.5 - 5.1 mmol/L 3.9 4.0 3.8  Chloride 98 - 111 mmol/L 105 108 106  CO2 22 - 32 mmol/L 26 25 22   Calcium 8.9 - 10.3 mg/dL 9.5 5.6(O8.6(L) 9.4  Total Protein 6.5 - 8.1 g/dL - 5.3(L) -  Total Bilirubin 0.3 - 1.2 mg/dL - 0.3 -  Alkaline Phos 38 - 126 U/L - 69 -  AST 15 - 41 U/L - 23 -  ALT 14 - 54 U/L - 24 -   CBC Latest Ref Rng & Units 01/13/2018 02/14/2016 02/14/2016  WBC 4.0 - 10.5 K/uL 6.7 6.8 7.1  Hemoglobin 12.0 - 15.0 g/dL 13.013.6 86.512.3 78.412.4  Hematocrit 36.0 - 46.0 % 39.6 36.8 36.7  Platelets 150 - 400 K/uL 232 217 221   Lipid Panel     Component Value Date/Time   CHOL 184 02/15/2016 0327   TRIG 192 (H) 02/15/2016 0327   HDL 37 (L) 02/15/2016 0327   CHOLHDL 5.0 02/15/2016 0327   VLDL 38 02/15/2016 0327   LDLCALC 109 (H) 02/15/2016 0327   HEMOGLOBIN A1C No results found for: HGBA1C, MPG TSH No results for input(s): TSH in the last 8760 hours.  Medications   Medications Discontinued During This Encounter  Medication Reason   benazepril (LOTENSIN) 20 MG tablet Change in therapy   amLODipine (NORVASC) 5 MG tablet Reorder   benazepril-hydrochlorthiazide (LOTENSIN HCT) 20-25 MG tablet Reorder   Current Meds  Medication Sig   atorvastatin (LIPITOR) 40 MG tablet Take 1 tablet (40 mg total) by mouth daily.   Bioflavonoid Products (BIOFLEX PO) Take 1 tablet by mouth daily.   buPROPion (WELLBUTRIN SR) 150 MG 12 hr tablet Take 1 tablet (150 mg total) by mouth daily.   diphenhydrAMINE (BENADRYL) 25 MG tablet Take 50 mg by mouth at bedtime.   levothyroxine (SYNTHROID, LEVOTHROID) 25 MCG tablet Take 25 mcg by mouth daily.   methocarbamol (ROBAXIN) 500 MG tablet Take 1 tablet (500 mg total) by mouth 2 (two) times daily.   metoprolol succinate (TOPROL-XL) 100 MG 24 hr tablet Take 1 tablet (100 mg total) by mouth every morning. Take with or immediately  following a meal.   Misc Natural Products (FOCUSED MIND PO) Take 1 tablet by mouth daily. FOCUS FACTOR   nitroGLYCERIN (NITROSTAT) 0.4 MG SL  tablet Place 1 tablet (0.4 mg total) under the tongue every 5 (five) minutes as needed for chest pain.   RANEXA 1000 MG SR tablet Take 1 tablet (1,000 mg total) by mouth 2 (two) times daily.   [DISCONTINUED] benazepril (LOTENSIN) 20 MG tablet Take 1 tablet (20 mg total) by mouth every morning.   Cardiac Studies:   Coronary angiography 11/02/2016 with FFR to OM-2: 50% stenosis.  Hemodynamically not significant.  Otherwise no significant disease.  Normal LVEF. Stenosis in D1 not present and 80% OM now 50% compared to Coronary Angiography 02/16/2016.  Lower extremity arterial duplex 10/10/2014: No hemodynamically significant stenoses are identified in the right lower extremity arterial system.  Mild diffuse disease in small vessels below the knee bilaterally with > 50% stenosis in the left AT artery This exam reveals normal perfusion of both the lower extremities with bilateral ABI of 0.97.  Carotid artery duplex 11/21/2014: Mild stenosis in the left external carotid artery (<50%).  Follow up in one year is appropriate if clinically indicated.  Assessment     ICD-10-CM   1. Coronary artery disease of native artery of native heart with stable angina pectoris (HCC)  I25.118    50% OM-1 stenosis 11/02/16  2. Essential hypertension  I10 benazepril-hydrochlorthiazide (LOTENSIN HCT) 20-25 MG tablet    amLODipine (NORVASC) 5 MG tablet  3. Centrilobular emphysema (HCC)  J43.2   4. Mixed hyperlipidemia  E78.2   5. H/O tobacco use, presenting hazards to health quit 2017  Z87.891   6. CKD (chronic kidney disease) stage 3, GFR 30-59 ml/min (HCC)  N18.3   7. Claudication in peripheral vascular disease (HCC)  I73.9 PCV LOWER ARTERIAL W ABI (UNILATERAL)    No orders of the defined types were placed in this encounter.  EKG 04/07/2018: Sinus bradycardia at rate of 55  bpm, normal axis.  No evidence of ischemia, normal EKG. No significant change from EKG 09/30/2017.  Recommendations:   Patient is here on a 7427-month office visit, virtual visit for angina pectoris, since being on Ranexa she has had remarkable improvement in anginal symptoms.  She is remain abstinent from tobacco.  Blood pressure is markedly elevated, she states that usually the blood pressure is much better controlled but still the diastolic blood pressure is in higher 90s.  I will discontinue plain benazepril and switch her to benazepril HCT 20/25 mg every morning.  Will also add amlodipine 5 mg daily.  With regard to bilateral lower extremity edema, she does have varicose veins, she was evaluated by [place] [facility], was recommended arterial evaluation first.  She does have symptoms of claudication involving bilateral hip and also calf, I am surprised about abnormal physical exam.  I will set her up for lower extremity arterial duplex.  I do not have a recent lipids, she does have mild stage III chronic kidney disease, otherwise from cardiac standpoint she has been doing well.  I would like to see her back in 2 months for follow-up of hypertension, peripheral arterial disease and also angina pectoris.  Yates DecampJay Blade Scheff, MD, [facility] Center[facility]ACC 10/06/2018, 1:37 PM [facility] [place] Pager: [phone] Office: [phone] If no answer Cell [phone]

## 2018-10-29 IMAGING — CT CT CHEST W/O CM
1 series · 15 of 34 positions shown, 19 images · non-contrast
Comparison: 06/02/2016.  02/14/2016.

CLINICAL DATA: Followup abnormal density at the left lung apex by
radiography.

EXAM:
CT CHEST WITHOUT CONTRAST
TECHNIQUE: Multidetector CT imaging of the chest was performed following the
standard protocol without IV contrast.

[Series 2: chest w/(date) · axial · 0.73mm/px · z∈[-297,-45]mm · 15 of 148 slices shown, 19 images]
[im 11/148  mediastinal]
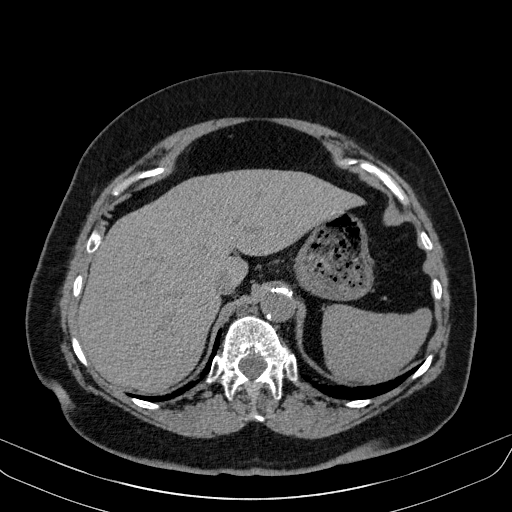
[im 11/148  lung]
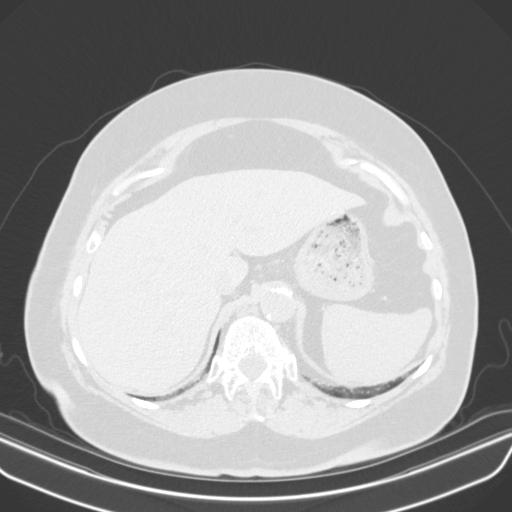
[im 22/148  lung]
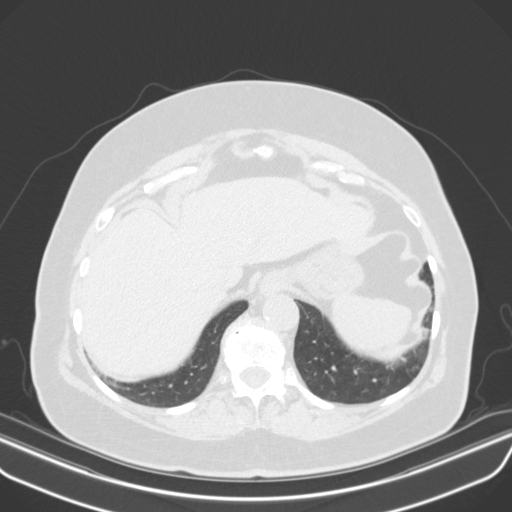
[im 30/148  lung]
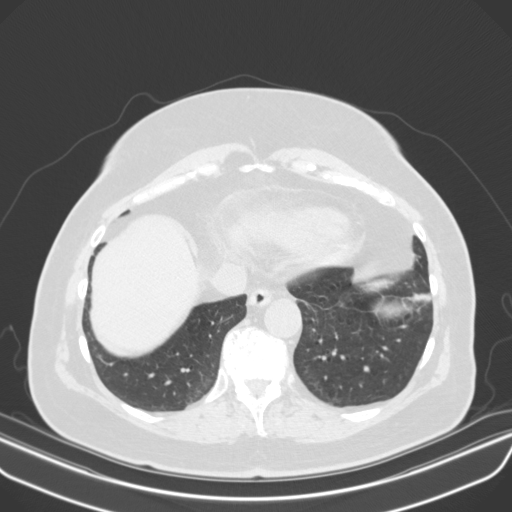
[im 39/148  lung]
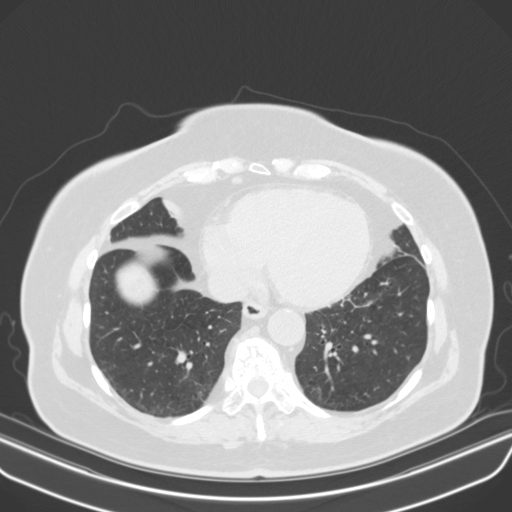
[im 50/148  mediastinal]
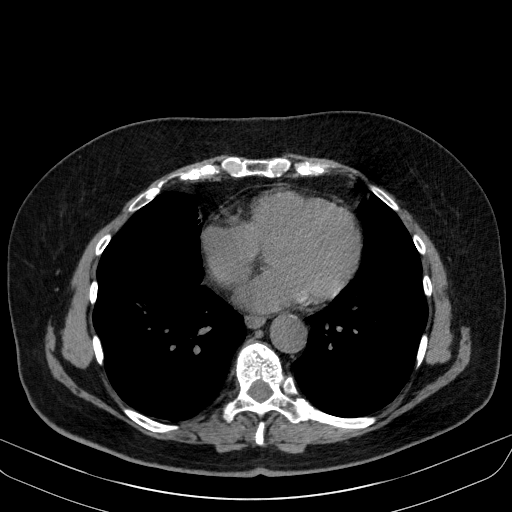
[im 50/148  lung]
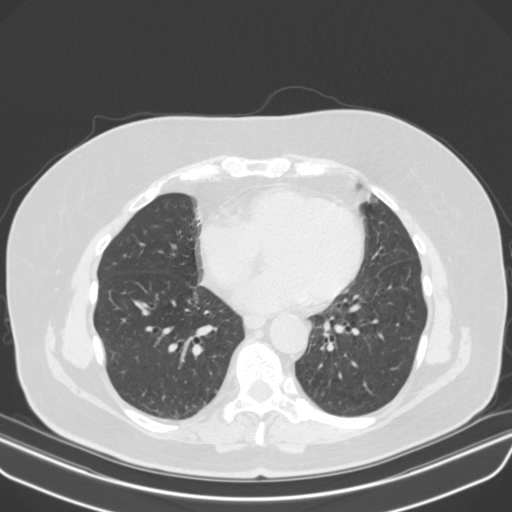
[im 59/148  lung]
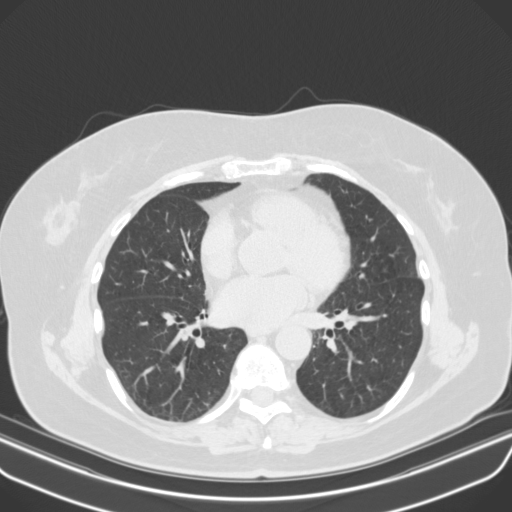
[im 66/148  lung]
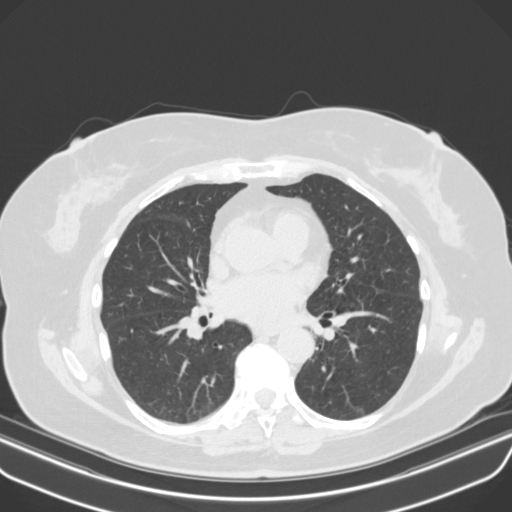
[im 77/148  lung]
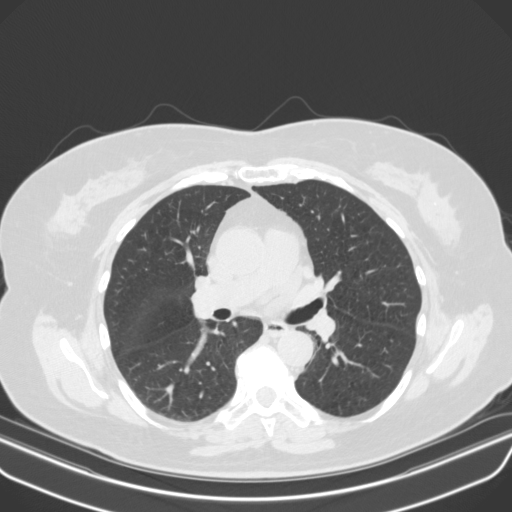
[im 82/148  mediastinal]
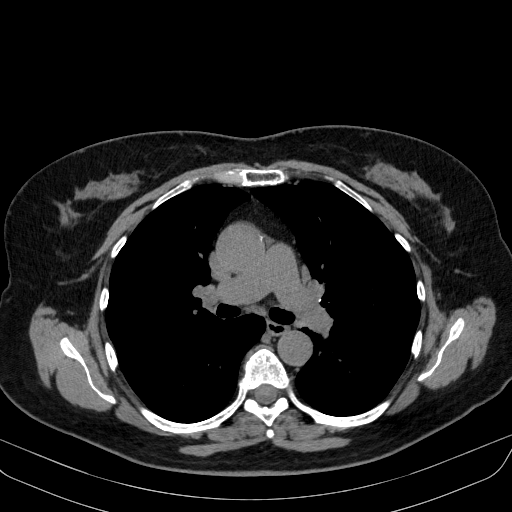
[im 82/148  lung]
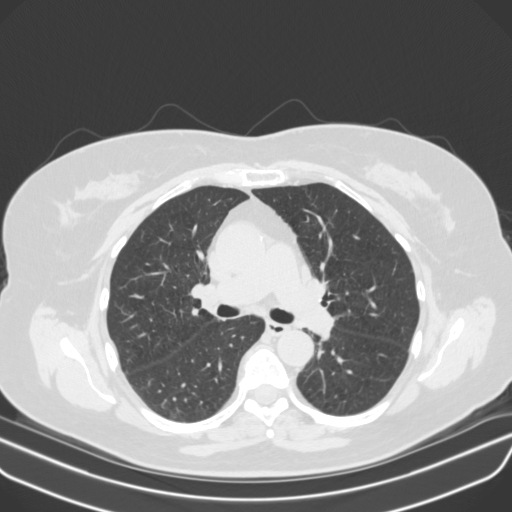
[im 89/148  lung]
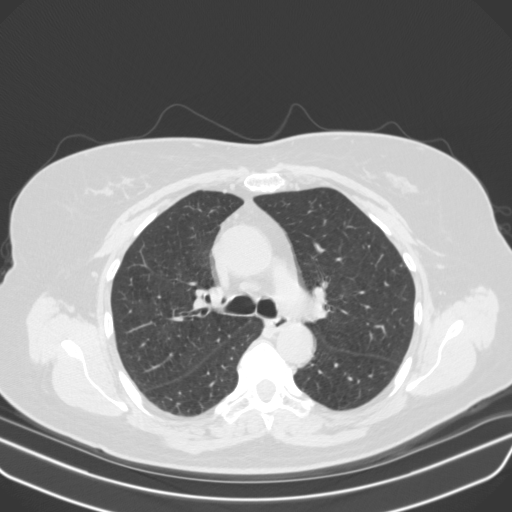
[im 99/148  lung]
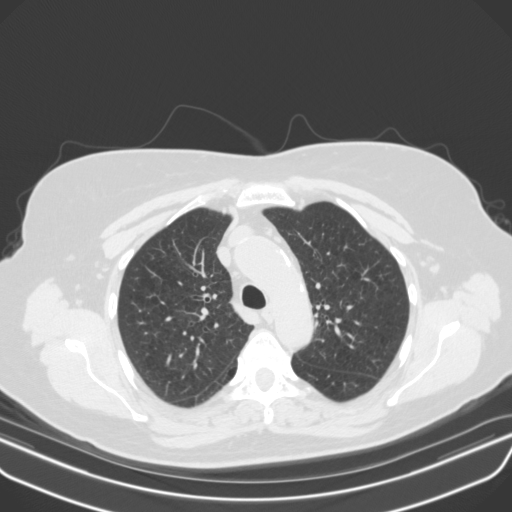
[im 109/148  lung]
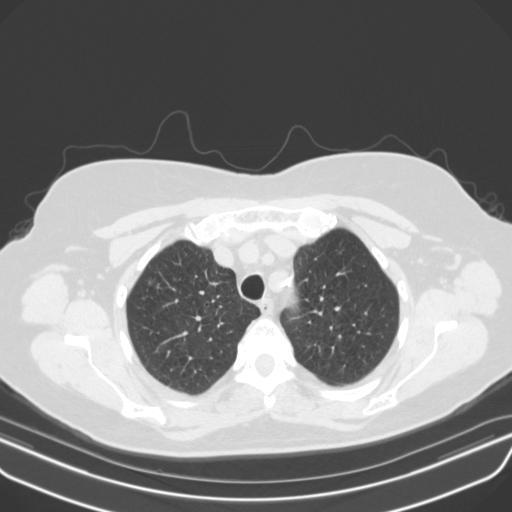
[im 118/148  mediastinal]
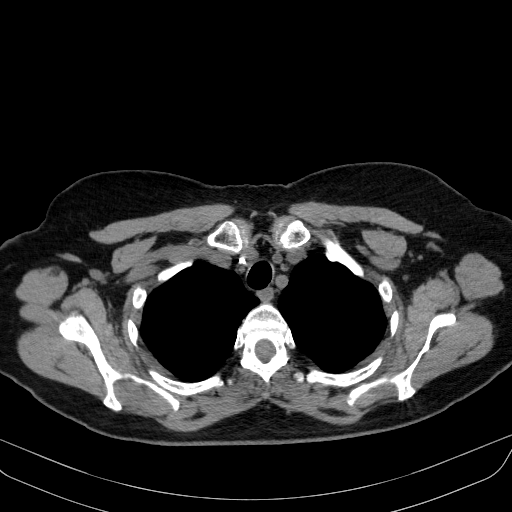
[im 118/148  lung]
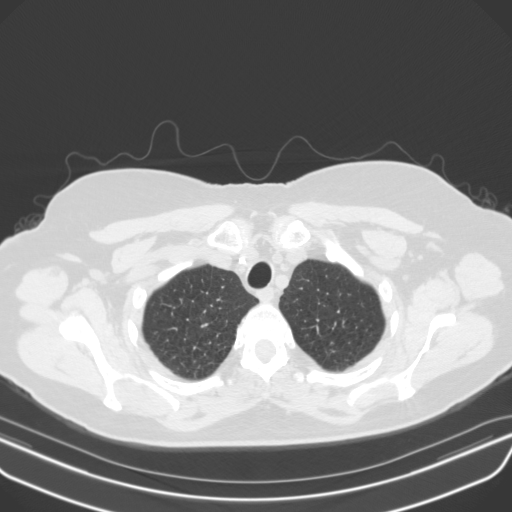
[im 126/148  lung]
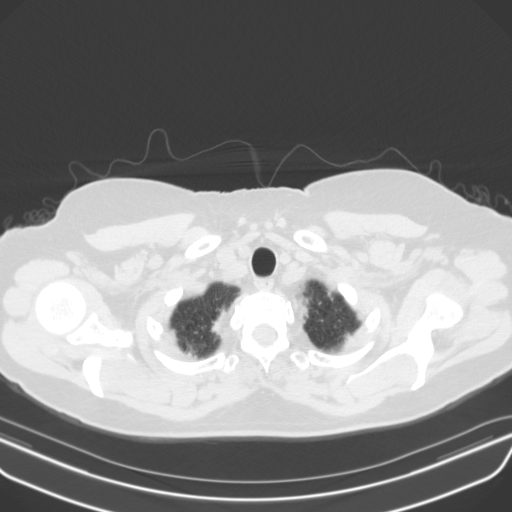
[im 137/148  lung]
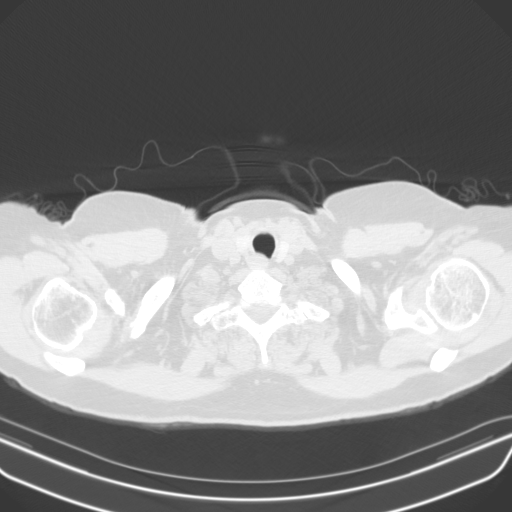

[15 of 34 positions shown; findings below may reference images not displayed]

FINDINGS: Cardiovascular: Aortic atherosclerosis. Heart size is normal. No
pericardial fluid. No visible coronary calcification.

Mediastinum/Nodes: No mediastinal or hilar mass or lymphadenopathy.

Lungs/Pleura: Centrilobular emphysema without dominant bleb/bulla
formation. Pleural and parenchymal scarring at both apices. No left
upper lobe mass to explain the chest radiographic finding. No mass
or nodule elsewhere that requires additional follow-up. Mild
scarring in the lingula and right middle lobe. No pleural fluid.

Upper Abdomen: Negative

Musculoskeletal: Chronic degenerative changes throughout the
thoracic spine.
IMPRESSION: No significant finding by CT. No left upper lobe mass. The patient
does have emphysematous changes in the upper lobes with pleural and
parenchymal scarring. There is no finding on this study that would
require further imaging follow-up specifically.

## 2018-11-23 ENCOUNTER — Other Ambulatory Visit: Payer: Self-pay

## 2018-11-23 ENCOUNTER — Ambulatory Visit (INDEPENDENT_AMBULATORY_CARE_PROVIDER_SITE_OTHER): Payer: Medicare Other

## 2018-11-23 DIAGNOSIS — I739 Peripheral vascular disease, unspecified: Secondary | ICD-10-CM | POA: Diagnosis not present

## 2018-12-01 ENCOUNTER — Encounter: Payer: Self-pay | Admitting: Cardiology

## 2018-12-02 ENCOUNTER — Other Ambulatory Visit: Payer: Self-pay | Admitting: Cardiology

## 2018-12-02 DIAGNOSIS — I10 Essential (primary) hypertension: Secondary | ICD-10-CM

## 2018-12-04 ENCOUNTER — Other Ambulatory Visit: Payer: Self-pay

## 2018-12-04 ENCOUNTER — Encounter: Payer: Self-pay | Admitting: Cardiology

## 2018-12-04 ENCOUNTER — Ambulatory Visit (INDEPENDENT_AMBULATORY_CARE_PROVIDER_SITE_OTHER): Payer: Medicare Other | Admitting: Cardiology

## 2018-12-04 VITALS — BP 128/76 | HR 73 | Ht 67.0 in | Wt 201.5 lb

## 2018-12-04 DIAGNOSIS — I25118 Atherosclerotic heart disease of native coronary artery with other forms of angina pectoris: Secondary | ICD-10-CM

## 2018-12-04 DIAGNOSIS — I10 Essential (primary) hypertension: Secondary | ICD-10-CM

## 2018-12-04 DIAGNOSIS — E782 Mixed hyperlipidemia: Secondary | ICD-10-CM | POA: Diagnosis not present

## 2018-12-04 HISTORY — DX: Essential (primary) hypertension: I10

## 2018-12-04 MED ORDER — ATORVASTATIN CALCIUM 40 MG PO TABS: 40.0000 mg | ORAL_TABLET | Freq: Every day | ORAL | 1 refills | Status: DC

## 2018-12-04 NOTE — Progress Notes (Signed)
Primary Physician/Referring:  Tally JoeSwayne, David, MD  Patient ID: Kendra HarrisPamela Mcgrath, female    DOB: 09/20/1950, 68 y.o.   MRN: [id number]  Chief Complaint  Patient presents with  . Hypertension  . Results    us  . Follow-up    29mo   HPI: Kendra Mcgrath  is a 68 y.o. 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 She has chronic stable angina and has responded well to Ranexa. By coronary angiography on 11/02/2016 which essentially revealed a 50% stenosis (-ve FFR) in the marginal vessel which is improved compared to previous and no significant disease was evident in the diagonal. She also uses NTG with relief. With renaxa she has had very minimal anginal symptoms.   She was seen by me a month ago and she had complained of severe pain in her hips, she is fairly active and I suspect that she may have claudication and PAD, due to severe pain and ordered lower extremity arterial duplex due to prior history of tobacco use, underlying coronary artery disease and performed on 11/22/2020.  She now presents for follow-up.  Past Medical History:  Diagnosis Date  . Anemia    hx of   . Anginal pain (HCC)    ongoing for last 4-5 years (reported in 08/2014); had normal stress test 08/30/14 [facility])  . Headache   . History of MRSA infection    2012 per patient on left ear   . HTN (hypertension) 12/04/2018  . Hypercholesterolemia   . Hypertension   . Hypoglycemia   . Lumbar herniated disc   . Osteoarthritis of spine with radiculopathy, lumbar region 04/01/2015  . Pneumonia    hx of pneumonia   . Prolapsed uterus     Past Surgical History:  Procedure Laterality Date  . ANTERIOR HIP REVISION Left 09/09/2014   Procedure: LEFT TOTAL POSTERIOR HIP REVISION;  Surgeon: Durene RomansMatthew Olin, MD;  Location: [facility];  Service: Orthopedics;  Laterality: Left;  . CARDIAC  CATHETERIZATION N/A 02/16/2016   Procedure: Left Heart Cath and Coronary Angiography;  Surgeon: Yates DecampJay Muaad Boehning, MD;  [facility];  Service: Cardiovascular;  Laterality: N/A;  . HIP CLOSED REDUCTION Left 01/13/2018   Procedure: CLOSED MANIPULATION HIP;  Surgeon: Samson FredericSwinteck, Brian, MD;  Location: [facility];  Service: Orthopedics;  Laterality: Left;  . INTRAVASCULAR PRESSURE WIRE/FFR STUDY N/A 11/02/2016   Procedure: Intravascular Pressure Wire/FFR Study;  Surgeon: Yates DecampGanji, Lorrayne Ismael, MD;  [facility];  Service: Cardiovascular;  Laterality: N/A;  . JOINT REPLACEMENT     left hip  . LEFT HEART CATH AND CORONARY ANGIOGRAPHY N/A 11/02/2016   Procedure: Left Heart Cath and Coronary Angiography;  Surgeon: Yates DecampGanji, Elke Holtry, MD;  [facility];  Service: Cardiovascular;  Laterality: N/A;  . left hip pinning     . LUMBAR LAMINECTOMY/DECOMPRESSION MICRODISCECTOMY Left 04/01/2015   Procedure: Left lumbar three to lumbar four microdiskectomy Right lumbar four to five discectomy redo;  Surgeon: Coletta MemosKyle Cabbell, MD;  Location: [facility]URO [facility];  Service: Neurosurgery;  Laterality: Left;  Left L34 microdiskectomy  . TONSILLECTOMY      Social History   Socioeconomic History  . Marital status: Married    Spouse name: Not on file  . Number of children: 6  . Years of education: Not on file  . Highest education level: Not on file  Occupational  History  . Not on file  Social Needs  . Financial resource strain: Not on file  . Food insecurity    Worry: Not on file    Inability: Not on file  . Transportation needs    Medical: Not on file    Non-medical: Not on file  Tobacco Use  . Smoking status: Former Smoker    Packs/day: 0.50    Years: 50.00    Pack years: 25.00    Types: Cigarettes  . Smokeless tobacco: Never Used  . Tobacco comment: YEAR AGO  Substance and Sexual Activity  . Alcohol use: Yes    Comment: rare  . Drug use: No  . Sexual activity: Not on file  Lifestyle  .  Physical activity    Days per week: Not on file    Minutes per session: Not on file  . Stress: Not on file  Relationships  . Social Herbalist on phone: Not on file    Gets together: Not on file    Attends religious service: Not on file    Active member of club or organization: Not on file    Attends meetings of clubs or organizations: Not on file    Relationship status: Not on file  . Intimate partner violence    Fear of current or ex partner: Not on file    Emotionally abused: Not on file    Physically abused: Not on file    Forced sexual activity: Not on file  Other Topics Concern  . Not on file  Social History Narrative  . Not on file   Review of Systems  Constitution: Negative for chills, decreased appetite, malaise/fatigue and weight gain.  Cardiovascular: Positive for chest pain, claudication (both leg and hips), dyspnea on exertion and leg swelling (left leg worse). Negative for syncope.  Endocrine: Negative for cold intolerance.  Hematologic/Lymphatic: Does not bruise/bleed easily.  Musculoskeletal: Negative for joint swelling.  Gastrointestinal: Negative for abdominal pain, anorexia, change in bowel habit, hematochezia and melena.  Neurological: Negative for headaches and light-headedness.  Psychiatric/Behavioral: Negative for depression and substance abuse.  All other systems reviewed and are negative.  Objective  Blood pressure 128/76, pulse 73, height 5\' 7"  (1.702 m), weight 201 lb 8 oz (91.4 kg), SpO2 96 %. Body mass index is 31.56 kg/m. Physical Exam  Constitutional: She appears well-developed. No distress.  Mildly obese  HENT:  Head: Atraumatic.  Eyes: Conjunctivae are normal.  Neck: Neck supple. No JVD present. No thyromegaly present.  Cardiovascular: Normal rate, regular rhythm, normal heart sounds, intact distal pulses and normal pulses. Exam reveals no gallop.  No murmur heard. Bilateral varicose veins noted, 2+ pitting edema.   Pulmonary/Chest: Effort normal and breath sounds normal.  Abdominal: Soft. Bowel sounds are normal.  Musculoskeletal: Normal range of motion.  Neurological: She is alert.  Skin: Skin is warm and dry.  Psychiatric: She has a normal mood and affect.   Radiology: No results found.  Laboratory examination:   CMP Latest Ref Rng & Units 01/13/2018 02/15/2016 02/14/2016  Glucose 70 - 99 mg/dL 96 91 114(H)  BUN 8 - 23 mg/dL 16 18 18   Creatinine 0.44 - 1.00 mg/dL 1.04(H) 1.08(H) 1.12(H)  Sodium 135 - 145 mmol/L 140 139 136  Potassium 3.5 - 5.1 mmol/L 3.9 4.0 3.8  Chloride 98 - 111 mmol/L 105 108 106  CO2 22 - 32 mmol/L 26 25 22   Calcium 8.9 - 10.3 mg/dL 9.5 8.6(L) 9.4  Total Protein  6.5 - 8.1 g/dL - 5.3(L) -  Total Bilirubin 0.3 - 1.2 mg/dL - 0.3 -  Alkaline Phos 38 - 126 U/L - 69 -  AST 15 - 41 U/L - 23 -  ALT 14 - 54 U/L - 24 -   CBC Latest Ref Rng & Units 01/13/2018 02/14/2016 02/14/2016  WBC 4.0 - 10.5 K/uL 6.7 6.8 7.1  Hemoglobin 12.0 - 15.0 g/dL 81.113.6 91.412.3 78.212.4  Hematocrit 36.0 - 46.0 % 39.6 36.8 36.7  Platelets 150 - 400 K/uL 232 217 221   Lipid Panel     Component Value Date/Time   CHOL 184 02/15/2016 0327   TRIG 192 (H) 02/15/2016 0327   HDL 37 (L) 02/15/2016 0327   CHOLHDL 5.0 02/15/2016 0327   VLDL 38 02/15/2016 0327   LDLCALC 109 (H) 02/15/2016 0327   HEMOGLOBIN A1C No results found for: HGBA1C, MPG TSH No results for input(s): TSH in the last 8760 hours.  Medications   Medications Discontinued During This Encounter  Medication Reason  . clindamycin (CLEOCIN) 300 MG capsule   . HYDROcodone-acetaminophen (NORCO/VICODIN) 5-325 MG tablet   . isosorbide mononitrate (IMDUR) 60 MG 24 hr tablet   . methocarbamol (ROBAXIN) 500 MG tablet   . naproxen (NAPROSYN) 500 MG tablet    Current Meds  Medication Sig  . amLODipine (NORVASC) 5 MG tablet TAKE 1 TABLET BY MOUTH EVERY DAY  . atorvastatin (LIPITOR) 40 MG tablet Take 1 tablet (40 mg total) by mouth daily.  .  benazepril-hydrochlorthiazide (LOTENSIN HCT) 20-25 MG tablet Take 1 tablet by mouth every morning.  Marland Kitchen. Bioflavonoid Products (BIOFLEX PO) Take 1 tablet by mouth daily.  Marland Kitchen. buPROPion (WELLBUTRIN SR) 150 MG 12 hr tablet Take 1 tablet (150 mg total) by mouth daily.  . diphenhydrAMINE (BENADRYL) 25 MG tablet Take 50 mg by mouth at bedtime.  Marland Kitchen. levothyroxine (SYNTHROID, LEVOTHROID) 25 MCG tablet Take 25 mcg by mouth daily.  . metoprolol succinate (TOPROL-XL) 100 MG 24 hr tablet Take 1 tablet (100 mg total) by mouth every morning. Take with or immediately following a meal.  . Misc Natural Products (FOCUSED MIND PO) Take 1 tablet by mouth daily. FOCUS FACTOR  . nitroGLYCERIN (NITROSTAT) 0.4 MG SL tablet Place 1 tablet (0.4 mg total) under the tongue every 5 (five) minutes as needed for chest pain.  Marland Kitchen. RANEXA 1000 MG SR tablet Take 1 tablet (1,000 mg total) by mouth 2 (two) times daily.   Cardiac Studies:   Coronary angiography 11/02/2016 with FFR to OM-2: 50% stenosis.  Hemodynamically not significant.  Otherwise no significant disease.  Normal LVEF. Stenosis in D1 not present and 80% OM now 50% compared to Coronary Angiography 02/16/2016.  Carotid artery duplex 11/21/2014: Mild stenosis in the left external carotid artery (<50%).  Follow up in one year is appropriate if clinically indicated.  Lower Extremity Arterial Duplex 11/23/2018: No hemodynamically significant stenoses are identified in the lower extremity arterial system.  This exam reveals normal perfusion of the right lower extremity (ABI 1.36) and normal perfusion of the left lower extremity (ABI 1.32).  Mildly abnormal biphasic waveform at the left ankle.  Assessment     ICD-10-CM   1. Coronary artery disease of native artery of native heart with stable angina pectoris (HCC)  I25.118   2. Essential hypertension  I10 EKG 12-Lead  3. Mixed hyperlipidemia  E78.2     Orders Placed This Encounter  Procedures  . EKG 12-Lead   EKG 12/04/2018:  Normal sinus rhythm at rate  of 81 bpm, borderline criteria for left atrial enlargement, left axis deviation, left anterior fascicular block. IRBBB. Poor R-wave progression, pulmonary disease pattern.  Atrial couplet, one pair noted.  No evidence of ischemia.  EKG 04/07/2018: Sinus bradycardia at rate of 55 bpm, normal axis.  No evidence of ischemia, normal EKG. No significant change from EKG 09/30/2017.  Recommendations:   Patient is here on a 6 week follow-up visit, had seen her on a virtual visit and check complained of severe symptoms of claudication, I performed lower except he arterial duplex which is essentially unremarkable.  Her symptoms are clearly pseudo-claudication.  Advised her to hold off on taking atorvastatin for the next 4 weeks, if there is improvement in her symptoms then I'll prescribe her Crestor.  If there is no improvement she will restart atorvastatin.  Patient states that her lipids have been well-controlled per recent labs done by her PCP.  Patient is presently asymptomatic with regards to angina pectoris.  Blood pressure is well controlled.  No changes in her medications were done today.  I'll see her back in one year for follow-up.   Yates DecampJay Karlisa Gaubert, MD, [facility] 12/04/2018, 3:00 PM [facility]. [place] Pager: [phone] Office: [phone] If no answer Cell [phone]

## 2018-12-27 ENCOUNTER — Other Ambulatory Visit: Payer: Medicare Other

## 2018-12-28 ENCOUNTER — Other Ambulatory Visit: Payer: Self-pay | Admitting: Cardiology

## 2018-12-28 DIAGNOSIS — I10 Essential (primary) hypertension: Secondary | ICD-10-CM

## 2018-12-29 ENCOUNTER — Other Ambulatory Visit: Payer: Self-pay | Admitting: Cardiology

## 2019-03-19 ENCOUNTER — Other Ambulatory Visit: Payer: Self-pay

## 2019-03-19 DIAGNOSIS — I10 Essential (primary) hypertension: Secondary | ICD-10-CM

## 2019-03-19 DIAGNOSIS — Z87891 Personal history of nicotine dependence: Secondary | ICD-10-CM

## 2019-03-20 MED ORDER — BUPROPION HCL ER (SR) 150 MG PO TB12: 150.0000 mg | ORAL_TABLET | Freq: Every day | ORAL | 3 refills | Status: DC

## 2019-03-20 MED ORDER — BENAZEPRIL-HYDROCHLOROTHIAZIDE 20-25 MG PO TABS: 1.0000 | ORAL_TABLET | ORAL | 0 refills | Status: DC

## 2019-03-20 NOTE — Telephone Encounter (Signed)
Just the lotensin

## 2019-04-12 ENCOUNTER — Other Ambulatory Visit: Payer: Self-pay | Admitting: Cardiology

## 2019-04-12 DIAGNOSIS — Z87891 Personal history of nicotine dependence: Secondary | ICD-10-CM

## 2019-04-12 NOTE — Telephone Encounter (Signed)
Can we fill this? 90 day

## 2019-06-06 ENCOUNTER — Other Ambulatory Visit: Payer: Self-pay | Admitting: Cardiology

## 2019-06-24 ENCOUNTER — Other Ambulatory Visit: Payer: Self-pay | Admitting: Cardiology

## 2019-06-24 DIAGNOSIS — I10 Essential (primary) hypertension: Secondary | ICD-10-CM

## 2019-06-25 ENCOUNTER — Other Ambulatory Visit: Payer: Self-pay | Admitting: Cardiology

## 2019-06-25 DIAGNOSIS — I10 Essential (primary) hypertension: Secondary | ICD-10-CM

## 2019-08-12 ENCOUNTER — Other Ambulatory Visit: Payer: Self-pay | Admitting: Cardiology

## 2019-08-12 DIAGNOSIS — Z87891 Personal history of nicotine dependence: Secondary | ICD-10-CM

## 2019-08-21 ENCOUNTER — Other Ambulatory Visit: Payer: Self-pay | Admitting: Cardiology

## 2019-08-23 ENCOUNTER — Other Ambulatory Visit: Payer: Self-pay

## 2019-08-23 ENCOUNTER — Telehealth: Payer: Self-pay

## 2019-08-23 NOTE — Telephone Encounter (Signed)
Patient called after I denied her refill on Atorvastatin because you discontinued it due to "side effects"  Back in Aug 2020. Patient said she never stopped taking it because she wasn't having any side effects. I will refill per patient request.

## 2019-08-27 ENCOUNTER — Other Ambulatory Visit: Payer: Self-pay

## 2019-08-27 MED ORDER — ATORVASTATIN CALCIUM 40 MG PO TABS: 40.0000 mg | ORAL_TABLET | Freq: Every day | ORAL | 0 refills | Status: DC

## 2019-09-20 ENCOUNTER — Other Ambulatory Visit: Payer: Self-pay | Admitting: Cardiology

## 2019-09-20 DIAGNOSIS — Z87891 Personal history of nicotine dependence: Secondary | ICD-10-CM

## 2019-09-21 ENCOUNTER — Other Ambulatory Visit: Payer: Self-pay | Admitting: Cardiology

## 2019-09-21 DIAGNOSIS — I10 Essential (primary) hypertension: Secondary | ICD-10-CM

## 2019-11-24 ENCOUNTER — Other Ambulatory Visit: Payer: Self-pay | Admitting: Cardiology

## 2019-12-04 ENCOUNTER — Ambulatory Visit: Payer: 59 | Admitting: Cardiology

## 2019-12-21 ENCOUNTER — Other Ambulatory Visit: Payer: Self-pay | Admitting: Cardiology

## 2019-12-21 DIAGNOSIS — I10 Essential (primary) hypertension: Secondary | ICD-10-CM

## 2020-01-11 ENCOUNTER — Encounter: Payer: Self-pay | Admitting: Cardiology

## 2020-01-11 ENCOUNTER — Other Ambulatory Visit: Payer: Self-pay

## 2020-01-11 ENCOUNTER — Ambulatory Visit: Payer: Medicare Other | Admitting: Cardiology

## 2020-01-11 VITALS — BP 109/72 | HR 69 | Resp 16 | Ht 67.0 in | Wt 194.8 lb

## 2020-01-11 DIAGNOSIS — I739 Peripheral vascular disease, unspecified: Secondary | ICD-10-CM

## 2020-01-11 DIAGNOSIS — E782 Mixed hyperlipidemia: Secondary | ICD-10-CM

## 2020-01-11 DIAGNOSIS — I10 Essential (primary) hypertension: Secondary | ICD-10-CM

## 2020-01-11 DIAGNOSIS — I25118 Atherosclerotic heart disease of native coronary artery with other forms of angina pectoris: Secondary | ICD-10-CM | POA: Diagnosis not present

## 2020-01-11 DIAGNOSIS — N1831 Chronic kidney disease, stage 3a: Secondary | ICD-10-CM

## 2020-01-11 MED ORDER — ROSUVASTATIN CALCIUM 10 MG PO TABS: 10.0000 mg | ORAL_TABLET | Freq: Every day | ORAL | 2 refills | Status: DC

## 2020-01-11 NOTE — Progress Notes (Signed)
Primary Physician/Referring:  Antony Contras, MD  Patient ID: Kendra Mcgrath, female    DOB: [birth year], 69 y.o.   MRN: [id number]  Chief Complaint  Patient presents with  . Coronary Artery Disease  . Hypertension  . Claudication    1 year f/u   HPI: Kendra Mcgrath  is a 69 y.o. 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 She has chronic stable angina and has responded well to Ranexa. By coronary angiography on 11/02/2016 which essentially revealed a 50% stenosis (-ve FFR) in the marginal vessel which is improved compared to previous and no significant disease was evident in the diagonal. She also uses NTG with relief. With renaxa she has had very minimal anginal symptoms.   I have seen her a year ago, she has been complaining about severe bilateral hip pain left worse than the right.  States that she cannot do her activities of daily living without having to stop frequently.  She feels it is different from her arthritis.  Pain comes on only with activity in the form of cramping.  There is no bluish discoloration or ulceration in her feet.  Past Medical History:  Diagnosis Date  . Anemia    hx of   . Anginal pain ([place])    ongoing for last 4-5 years (reported in 08/2014); had normal stress test 08/30/14 [facility])  . Headache   . History of MRSA infection    2012 per patient on left ear   . HTN (hypertension) 12/04/2018  . Hypercholesterolemia   . Hypertension   . Hypoglycemia   . Lumbar herniated disc   . Osteoarthritis of spine with radiculopathy, lumbar region 04/01/2015  . Pneumonia    hx of pneumonia   . Prolapsed uterus     Past Surgical History:  Procedure Laterality Date  . ANTERIOR HIP REVISION Left 09/09/2014   Procedure: LEFT TOTAL POSTERIOR HIP REVISION;  Surgeon: Paralee Cancel, MD;  Location: [facility];  Service: Orthopedics;   Laterality: Left;  . CARDIAC CATHETERIZATION N/A 02/16/2016   Procedure: Left Heart Cath and Coronary Angiography;  Surgeon: Adrian Prows, MD;  Location: [place] [facility];  Service: Cardiovascular;  Laterality: N/A;  . HIP CLOSED REDUCTION Left 01/13/2018   Procedure: CLOSED MANIPULATION HIP;  Surgeon: Rod Can, MD;  Location: [facility];  Service: Orthopedics;  Laterality: Left;  . INTRAVASCULAR PRESSURE WIRE/FFR STUDY N/A 11/02/2016   Procedure: Intravascular Pressure Wire/FFR Study;  Surgeon: Adrian Prows, MD;  Location: [place] [facility];  Service: Cardiovascular;  Laterality: N/A;  . JOINT REPLACEMENT     left hip  . LEFT HEART CATH AND CORONARY ANGIOGRAPHY N/A 11/02/2016   Procedure: Left Heart Cath and Coronary Angiography;  Surgeon: Adrian Prows, MD;  Location: [place] [facility];  Service: Cardiovascular;  Laterality: N/A;  . left hip pinning     . LUMBAR LAMINECTOMY/DECOMPRESSION MICRODISCECTOMY Left 04/01/2015   Procedure: Left lumbar three to lumbar four microdiskectomy Right lumbar four to five discectomy redo;  Surgeon: Ashok Pall, MD;  Location: [place] NEURO ORS;  Service: Neurosurgery;  Laterality: Left;  Left L34 microdiskectomy  . TONSILLECTOMY     Social History   Tobacco Use  . Smoking status: Former Smoker    Packs/day: 0.50    Years: 50.00    Pack years: 25.00    Types: Cigarettes  . Smokeless tobacco: Never  Used  . Tobacco comment: YEAR AGO  Substance Use Topics  . Alcohol use: Yes    Comment: rare   Marital Status: Married   Review of Systems  Cardiovascular: Positive for chest pain, claudication (both leg and hips), dyspnea on exertion and leg swelling (left leg worse).  Musculoskeletal: Positive for joint pain.  Gastrointestinal: Negative for melena.  All other systems reviewed and are negative.  Objective  Blood pressure 109/72, pulse 69, resp. rate 16, height '5\' 7"'  (1.702 m), weight 194 lb 12.8 oz (88.4 kg), SpO2 100 %. Body mass index is 30.51  kg/m. Physical Exam Constitutional:      General: She is not in acute distress.    Appearance: She is well-developed.     Comments: Mildly obese  Eyes:     Conjunctiva/sclera: Conjunctivae normal.  Neck:     Thyroid: No thyromegaly.     Vascular: No JVD.  Cardiovascular:     Rate and Rhythm: Normal rate and regular rhythm.     Pulses:          Carotid pulses are 2+ on the right side and 2+ on the left side.      Femoral pulses are 1+ on the right side and 1+ on the left side.      Popliteal pulses are 2+ on the right side and 2+ on the left side.       Dorsalis pedis pulses are 0 on the right side and 0 on the left side.       Posterior tibial pulses are 2+ on the right side and 2+ on the left side.     Heart sounds: Normal heart sounds. No murmur heard.  No gallop.      Comments: Bilateral varicose veins noted, 2+ pitting edema. Pulmonary:     Effort: Pulmonary effort is normal.     Breath sounds: Normal breath sounds.  Abdominal:     General: Bowel sounds are normal.     Palpations: Abdomen is soft.    Radiology: No results found.  Laboratory examination:    CMP Latest Ref Rng & Units 01/13/2018 02/15/2016 02/14/2016  Glucose 70 - 99 mg/dL 96 91 114(H)  BUN 8 - 23 mg/dL '16 18 18  ' Creatinine 0.44 - 1.00 mg/dL 1.04(H) 1.08(H) 1.12(H)  Sodium 135 - 145 mmol/L 140 139 136  Potassium 3.5 - 5.1 mmol/L 3.9 4.0 3.8  Chloride 98 - 111 mmol/L 105 108 106  CO2 22 - 32 mmol/L '26 25 22  ' Calcium 8.9 - 10.3 mg/dL 9.5 8.6(L) 9.4  Total Protein 6.5 - 8.1 g/dL - 5.3(L) -  Total Bilirubin 0.3 - 1.2 mg/dL - 0.3 -  Alkaline Phos 38 - 126 U/L - 69 -  AST 15 - 41 U/L - 23 -  ALT 14 - 54 U/L - 24 -   CBC Latest Ref Rng & Units 01/13/2018 02/14/2016 02/14/2016  WBC 4.0 - 10.5 K/uL 6.7 6.8 7.1  Hemoglobin 12.0 - 15.0 g/dL 13.6 12.3 12.4  Hematocrit 36 - 46 % 39.6 36.8 36.7  Platelets 150 - 400 K/uL 232 217 221   Lipid Panel     Component Value Date/Time   CHOL 184 02/15/2016 0327    TRIG 192 (H) 02/15/2016 0327   HDL 37 (L) 02/15/2016 0327   CHOLHDL 5.0 02/15/2016 0327   VLDL 38 02/15/2016 0327   LDLCALC 109 (H) 02/15/2016 0327   HEMOGLOBIN A1C No results found for: HGBA1C, MPG TSH No results for input(s):  TSH in the last 8760 hours.  Medications   Current Outpatient Medications on File Prior to Visit  Medication Sig Dispense Refill  . amLODipine (NORVASC) 5 MG tablet TAKE 1 TABLET BY MOUTH EVERY DAY 90 tablet 1  . benazepril-hydrochlorthiazide (LOTENSIN HCT) 20-25 MG tablet TAKE 1 TABLET BY MOUTH EVERY DAY IN THE MORNING 90 tablet 1  . buPROPion (WELLBUTRIN SR) 150 MG 12 hr tablet TAKE 1 TABLET BY MOUTH EVERY DAY 90 tablet 2  . diphenhydrAMINE (BENADRYL) 25 MG tablet Take 50 mg by mouth at bedtime.    Marland Kitchen levothyroxine (SYNTHROID, LEVOTHROID) 25 MCG tablet Take 25 mcg by mouth daily.  5  . metoprolol succinate (TOPROL-XL) 100 MG 24 hr tablet Take 1 tablet (100 mg total) by mouth every morning. Take with or immediately following a meal. 90 tablet 1  . nitroGLYCERIN (NITROSTAT) 0.4 MG SL tablet Place 1 tablet (0.4 mg total) under the tongue every 5 (five) minutes as needed for chest pain. 25 tablet 2  . ranolazine (RANEXA) 1000 MG SR tablet TAKE 1 TABLET BY MOUTH TWICE A DAY 180 tablet 1  . Bioflavonoid Products (BIOFLEX PO) Take 1 tablet by mouth daily.    . Misc Natural Products (FOCUSED MIND PO) Take 1 tablet by mouth daily. FOCUS FACTOR     No current facility-administered medications on file prior to visit.    Cardiac Studies:   Coronary angiography 11/02/2016 with FFR to OM-2: 50% stenosis.  Hemodynamically not significant.  Otherwise no significant disease.  Normal LVEF. Stenosis in D1 not present and 80% OM now 50% compared to Coronary Angiography 02/16/2016.  Carotid artery duplex 11/21/2014: Mild stenosis in the left external carotid artery (<50%).  Follow up in one year is appropriate if clinically indicated.  Lower Extremity Arterial Duplex  11/23/2018: No hemodynamically significant stenoses are identified in the lower extremity arterial system.  This exam reveals normal perfusion of the right lower extremity (ABI 1.36) and normal perfusion of the left lower extremity (ABI 1.32).  Mildly abnormal biphasic waveform at the left ankle.  EKG:  EKG 01/11/2020: Normal sinus rhythm with rate of 66 bpm, left atrial enlargement, left axis deviation, left anterior fascicular block.  Incomplete right bundle branch block.  No evidence of ischemia.  No significant change from 12/04/2018.  Assessment     ICD-10-CM   1. Coronary artery disease of native artery of native heart with stable angina pectoris ([place])  I25.118 EKG 12-Lead  2. Claudication in peripheral vascular disease (HCC)  I73.9   3. Essential hypertension  I10 CBC    TSH  4. Mixed hyperlipidemia  E78.2 Lipid Panel With LDL/HDL Ratio    rosuvastatin (CRESTOR) 10 MG tablet  5. Stage 3a chronic kidney disease  N18.31 CMP14+EGFR    Meds ordered this encounter  Medications  . rosuvastatin (CRESTOR) 10 MG tablet    Sig: Take 1 tablet (10 mg total) by mouth daily.    Dispense:  30 tablet    Refill:  2    Medications Discontinued During This Encounter  Medication Reason  . atorvastatin (LIPITOR) 40 MG tablet Side effect (s)     Recommendations:   Erinne Gillentine  is a 68 y.o. 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 She has chronic stable angina and has responded well to Ranexa. By coronary angiography on 11/02/2016 which essentially revealed a 50% stenosis (-  ve FFR) in the marginal vessel which is improved compared to previous and no significant disease was evident in the diagonal. She also uses NTG with relief. With renaxa she has had very minimal anginal symptoms.   I have seen her a year ago, she has been complaining about  severe bilateral hip pain left worse than the right.  Symptoms of hip pain are suggestive of PAD and claudication, she has decreased pulses in bilateral hips.  Although the ABI are normal, we can certainly miss out on iliac artery stenosis.  I have recommended proceeding with peripheral arteriogram, extensive discussion was held regarding CTA versus invasive procedure and risks associated with invasive procedure including bleeding, risk of embolic complications and need for surgical revascularization.  Patient wants to proceed with the procedure.  With regard to angina pectoris, symptoms have remained stable since being on Ranexa.  She has discontinued Lipitor as she noticed significant leg swelling with the medication and is willing to take a different statin, I started her on Crestor 10 mg daily, she probably will need 20 mg.  I will obtain baseline labs today are in the next few days prior to starting the medication.  I will see her back after the angiography and make further recommendations.   Adrian Prows, MD, [facility] 01/11/2020, 11:04 AM Office: [phone]

## 2020-01-19 DIAGNOSIS — Z23 Encounter for immunization: Secondary | ICD-10-CM | POA: Diagnosis not present

## 2020-01-31 DIAGNOSIS — I10 Essential (primary) hypertension: Secondary | ICD-10-CM | POA: Diagnosis not present

## 2020-01-31 DIAGNOSIS — E782 Mixed hyperlipidemia: Secondary | ICD-10-CM | POA: Diagnosis not present

## 2020-01-31 DIAGNOSIS — N1831 Chronic kidney disease, stage 3a: Secondary | ICD-10-CM | POA: Diagnosis not present

## 2020-02-01 ENCOUNTER — Other Ambulatory Visit (HOSPITAL_COMMUNITY): Payer: Self-pay

## 2020-02-01 LAB — CMP14+EGFR
ALT: 23 IU/L (ref 0–32)
AST: 19 IU/L (ref 0–40)
Albumin/Globulin Ratio: 1.9 (ref 1.2–2.2)
Albumin: 4.6 g/dL (ref 3.8–4.8)
Alkaline Phosphatase: 69 IU/L (ref 44–121)
BUN/Creatinine Ratio: 14 (ref 12–28)
BUN: 16 mg/dL (ref 8–27)
Bilirubin Total: 0.3 mg/dL (ref 0.0–1.2)
CO2: 24 mmol/L (ref 20–29)
Calcium: 10.1 mg/dL (ref 8.7–10.3)
Chloride: 99 mmol/L (ref 96–106)
Creatinine, Ser: 1.13 mg/dL — ABNORMAL HIGH (ref 0.57–1.00)
GFR calc Af Amer: 57 mL/min/{1.73_m2} — ABNORMAL LOW (ref >59)
GFR calc non Af Amer: 49 mL/min/{1.73_m2} — ABNORMAL LOW (ref >59)
Globulin, Total: 2.4 g/dL (ref 1.5–4.5)
Glucose: 78 mg/dL (ref 65–99)
Potassium: 5 mmol/L (ref 3.5–5.2)
Sodium: 138 mmol/L (ref 134–144)
Total Protein: 7 g/dL (ref 6.0–8.5)

## 2020-02-01 LAB — CBC
Hematocrit: 41.9 % (ref 34.0–46.6)
Hemoglobin: 14.3 g/dL (ref 11.1–15.9)
MCH: 29.4 pg (ref 26.6–33.0)
MCHC: 34.1 g/dL (ref 31.5–35.7)
MCV: 86 fL (ref 79–97)
Platelets: 227 10*3/uL (ref 150–450)
RBC: 4.87 x10E6/uL (ref 3.77–5.28)
RDW: 13.2 % (ref 11.7–15.4)
WBC: 7.4 10*3/uL (ref 3.4–10.8)

## 2020-02-01 LAB — LIPID PANEL WITH LDL/HDL RATIO
Cholesterol, Total: 192 mg/dL (ref 100–199)
HDL: 54 mg/dL (ref >39)
LDL Chol Calc (NIH): 102 mg/dL — ABNORMAL HIGH (ref 0–99)
LDL/HDL Ratio: 1.9 ratio (ref 0.0–3.2)
Triglycerides: 209 mg/dL — ABNORMAL HIGH (ref 0–149)
VLDL Cholesterol Cal: 36 mg/dL (ref 5–40)

## 2020-02-01 LAB — TSH: TSH: 3.02 u[IU]/mL (ref 0.450–4.500)

## 2020-02-05 ENCOUNTER — Other Ambulatory Visit: Payer: Self-pay

## 2020-02-05 ENCOUNTER — Telehealth: Payer: Self-pay

## 2020-02-05 DIAGNOSIS — E782 Mixed hyperlipidemia: Secondary | ICD-10-CM

## 2020-02-05 NOTE — Telephone Encounter (Signed)
Patient is wanting to know if you want her to go up on her Rosuvastatin to 20 mg?

## 2020-02-06 ENCOUNTER — Other Ambulatory Visit: Payer: Self-pay | Admitting: Cardiology

## 2020-02-06 DIAGNOSIS — E782 Mixed hyperlipidemia: Secondary | ICD-10-CM

## 2020-02-06 MED ORDER — ROSUVASTATIN CALCIUM 20 MG PO TABS: 20.0000 mg | ORAL_TABLET | Freq: Every day | ORAL | 1 refills | Status: DC

## 2020-02-08 ENCOUNTER — Telehealth: Payer: Self-pay

## 2020-02-08 ENCOUNTER — Other Ambulatory Visit (HOSPITAL_COMMUNITY)
Admission: RE | Admit: 2020-02-08 | Discharge: 2020-02-08 | Disposition: A | Discharge status: Home / Self Care | Payer: Medicare Other | Source: Ambulatory Visit | Attending: Cardiology | Admitting: Cardiology

## 2020-02-08 DIAGNOSIS — Z20822 Contact with and (suspected) exposure to covid-19: Secondary | ICD-10-CM | POA: Insufficient documentation

## 2020-02-08 DIAGNOSIS — Z01812 Encounter for preprocedural laboratory examination: Secondary | ICD-10-CM | POA: Diagnosis not present

## 2020-02-08 LAB — SARS CORONAVIRUS 2 (TAT 6-24 HRS): SARS Coronavirus 2: NEGATIVE

## 2020-02-11 DIAGNOSIS — I739 Peripheral vascular disease, unspecified: Secondary | ICD-10-CM | POA: Diagnosis present

## 2020-02-12 ENCOUNTER — Other Ambulatory Visit: Payer: Self-pay

## 2020-02-12 ENCOUNTER — Encounter (HOSPITAL_COMMUNITY)
Admission: RE | Disposition: A | Discharge status: Home / Self Care | Payer: Self-pay | Source: Home / Self Care | Attending: Cardiology

## 2020-02-12 ENCOUNTER — Ambulatory Visit (HOSPITAL_COMMUNITY)
Admission: RE | Admit: 2020-02-12 | Discharge: 2020-02-12 | Disposition: A | Discharge status: Home / Self Care | Payer: Medicare Other | Attending: Cardiology | Admitting: Cardiology

## 2020-02-12 ENCOUNTER — Encounter (HOSPITAL_COMMUNITY): Payer: Self-pay | Admitting: Cardiology

## 2020-02-12 DIAGNOSIS — Z79899 Other long term (current) drug therapy: Secondary | ICD-10-CM | POA: Diagnosis not present

## 2020-02-12 DIAGNOSIS — Z87891 Personal history of nicotine dependence: Secondary | ICD-10-CM | POA: Insufficient documentation

## 2020-02-12 DIAGNOSIS — M79652 Pain in left thigh: Secondary | ICD-10-CM | POA: Diagnosis not present

## 2020-02-12 DIAGNOSIS — Z79890 Hormone replacement therapy (postmenopausal): Secondary | ICD-10-CM | POA: Insufficient documentation

## 2020-02-12 DIAGNOSIS — E7800 Pure hypercholesterolemia, unspecified: Secondary | ICD-10-CM | POA: Diagnosis not present

## 2020-02-12 DIAGNOSIS — I25118 Atherosclerotic heart disease of native coronary artery with other forms of angina pectoris: Secondary | ICD-10-CM | POA: Diagnosis not present

## 2020-02-12 DIAGNOSIS — I739 Peripheral vascular disease, unspecified: Secondary | ICD-10-CM | POA: Diagnosis present

## 2020-02-12 DIAGNOSIS — E785 Hyperlipidemia, unspecified: Secondary | ICD-10-CM | POA: Diagnosis not present

## 2020-02-12 DIAGNOSIS — I10 Essential (primary) hypertension: Secondary | ICD-10-CM | POA: Diagnosis not present

## 2020-02-12 HISTORY — PX: LOWER EXTREMITY ANGIOGRAPHY: CATH118251

## 2020-02-12 HISTORY — PX: ABDOMINAL AORTOGRAM W/LOWER EXTREMITY: CATH118223

## 2020-02-12 SURGERY — LOWER EXTREMITY ANGIOGRAPHY
Anesthesia: LOCAL

## 2020-02-12 MED ORDER — MIDAZOLAM HCL 2 MG/2ML IJ SOLN
INTRAMUSCULAR | Status: DC | PRN
  Administered 2020-02-12 (×2): 2 mg via INTRAVENOUS

## 2020-02-12 MED ORDER — ASPIRIN EC 81 MG PO TBEC: 81.0000 mg | DELAYED_RELEASE_TABLET | Freq: Every day | ORAL | 3 refills | Status: DC

## 2020-02-12 MED ORDER — HEPARIN (PORCINE) IN NACL 1000-0.9 UT/500ML-% IV SOLN
INTRAVENOUS | Status: AC
  Filled 2020-02-12: qty 1000

## 2020-02-12 MED ORDER — HYDRALAZINE HCL 20 MG/ML IJ SOLN: 5.0000 mg | INTRAMUSCULAR | Status: DC | PRN

## 2020-02-12 MED ORDER — ACETAMINOPHEN 325 MG PO TABS: 650.0000 mg | ORAL_TABLET | ORAL | Status: DC | PRN

## 2020-02-12 MED ORDER — SODIUM CHLORIDE 0.9 % IV BOLUS
500.0000 mL | Freq: Once | INTRAVENOUS | Status: AC
  Administered 2020-02-12: 1000 mL via INTRAVENOUS

## 2020-02-12 MED ORDER — SODIUM CHLORIDE 0.9% FLUSH: 3.0000 mL | Freq: Two times a day (BID) | INTRAVENOUS | Status: DC

## 2020-02-12 MED ORDER — ASPIRIN 81 MG PO CHEW: 81.0000 mg | CHEWABLE_TABLET | ORAL | Status: AC

## 2020-02-12 MED ORDER — MIDAZOLAM HCL 2 MG/2ML IJ SOLN
INTRAMUSCULAR | Status: AC
  Filled 2020-02-12: qty 2

## 2020-02-12 MED ORDER — HEPARIN (PORCINE) IN NACL 1000-0.9 UT/500ML-% IV SOLN
INTRAVENOUS | Status: DC | PRN
  Administered 2020-02-12 (×2): 500 mL

## 2020-02-12 MED ORDER — SODIUM CHLORIDE 0.9 % IV SOLN: INTRAVENOUS | Status: DC

## 2020-02-12 MED ORDER — SODIUM CHLORIDE 0.9% FLUSH: 3.0000 mL | INTRAVENOUS | Status: DC | PRN

## 2020-02-12 MED ORDER — ASPIRIN 81 MG PO CHEW
CHEWABLE_TABLET | ORAL | Status: AC
  Administered 2020-02-12: 81 mg via ORAL
  Filled 2020-02-12: qty 1

## 2020-02-12 MED ORDER — LIDOCAINE HCL (PF) 1 % IJ SOLN
INTRAMUSCULAR | Status: AC
  Filled 2020-02-12: qty 30

## 2020-02-12 MED ORDER — SODIUM CHLORIDE 0.9 % WEIGHT BASED INFUSION: 1.0000 mL/kg/h | INTRAVENOUS | Status: DC

## 2020-02-12 MED ORDER — IODIXANOL 320 MG/ML IV SOLN
INTRAVENOUS | Status: DC | PRN
  Administered 2020-02-12: 130 mL via INTRA_ARTERIAL

## 2020-02-12 MED ORDER — ONDANSETRON HCL 4 MG/2ML IJ SOLN: 4.0000 mg | Freq: Four times a day (QID) | INTRAMUSCULAR | Status: DC | PRN

## 2020-02-12 MED ORDER — FENTANYL CITRATE (PF) 100 MCG/2ML IJ SOLN
INTRAMUSCULAR | Status: DC | PRN
Start: 2020-02-12 — End: 2020-02-12
  Administered 2020-02-12 (×2): 25 ug via INTRAVENOUS

## 2020-02-12 MED ORDER — SODIUM CHLORIDE 0.9 % IV SOLN: 250.0000 mL | INTRAVENOUS | Status: DC | PRN

## 2020-02-12 MED ORDER — LABETALOL HCL 5 MG/ML IV SOLN: 10.0000 mg | INTRAVENOUS | Status: DC | PRN

## 2020-02-12 MED ORDER — FENTANYL CITRATE (PF) 100 MCG/2ML IJ SOLN
INTRAMUSCULAR | Status: AC
  Filled 2020-02-12: qty 2

## 2020-02-12 MED ORDER — LIDOCAINE HCL (PF) 1 % IJ SOLN
INTRAMUSCULAR | Status: DC | PRN
  Administered 2020-02-12: 15 mL via INTRADERMAL

## 2020-02-12 SURGICAL SUPPLY — 13 items
CATH OMNI FLUSH 5F 65CM (CATHETERS) ×3 IMPLANT
DEVICE CLOSURE MYNXGRIP 5F (Vascular Products) ×3 IMPLANT
KIT MICROPUNCTURE NIT STIFF (SHEATH) ×3 IMPLANT
KIT PV (KITS) ×3 IMPLANT
SHEATH PINNACLE 5F 10CM (SHEATH) ×3 IMPLANT
SHEATH PROBE COVER 6X72 (BAG) ×3 IMPLANT
STOPCOCK MORSE 400PSI 3WAY (MISCELLANEOUS) ×3 IMPLANT
SYR MEDRAD MARK 7 150ML (SYRINGE) ×3 IMPLANT
TRANSDUCER W/STOPCOCK (MISCELLANEOUS) ×3 IMPLANT
TRAY PV CATH (CUSTOM PROCEDURE TRAY) ×3 IMPLANT
TUBING CIL FLEX 10 FLL-RA (TUBING) ×3 IMPLANT
TUBING INJECTOR 48 (MISCELLANEOUS) ×3 IMPLANT
WIRE HITORQ VERSACORE ST 145CM (WIRE) ×3 IMPLANT

## 2020-02-12 NOTE — Interval H&P Note (Signed)
History and Physical Interval Note:  02/12/2020 8:28 AM  Shirl Harris  has presented today for surgery, with the diagnosis of Periperal Artery Disease.  The various methods of treatment have been discussed with the patient and family. After consideration of risks, benefits and other options for treatment, the patient has consented to  Procedure(s): Bilateral LOWER EXTREMITY ANGIOGRAPHY (Bilateral) and possible angioplasty as a surgical intervention.  The patient's history has been reviewed, patient examined, no change in status, stable for surgery.  I have reviewed the patient's chart and labs.  Questions were answered to the patient's satisfaction.     Yates Decamp

## 2020-02-12 NOTE — Discharge Instructions (Signed)

## 2020-02-12 NOTE — H&P (View-Only) (Signed)
Primary Physician/Referring:  Tally Joe, MD  Patient ID: Kendra Mcgrath, female    DOB: [birth year], 69 y.o.   MRN: [id number]  No chief complaint on file.  HPI: Kendra Mcgrath  is a 69 y.o. 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 She has chronic stable angina and has responded well to Ranexa. By coronary angiography on 11/02/2016 which essentially revealed a 50% stenosis (-ve FFR) in the marginal vessel which is improved compared to previous and no significant disease was evident in the diagonal. She also uses NTG with relief. With renaxa she has had very minimal anginal symptoms.  I seen her about a month ago and due to persistent symptoms of claudication suggestive by symptoms in bilateral hips left worse than the right, unable to do activities of daily living without having to stop, I have scheduled her for peripheral arteriogram.  And seen her at the bedside today prior to procedure, no change in symptoms.  She requested that if I could do the procedure under general anesthesia and wanted to see me prior to the procedure.   Past Medical History:  Diagnosis Date  . Anemia    hx of   . Anginal pain (HCC)    ongoing for last 4-5 years (reported in 08/2014); had normal stress test 08/30/14 [facility])  . Headache   . History of MRSA infection    2012 per patient on left ear   . HTN (hypertension) 12/04/2018  . Hypercholesterolemia   . Hypertension   . Hypoglycemia   . Lumbar herniated disc   . Osteoarthritis of spine with radiculopathy, lumbar region 04/01/2015  . Pneumonia    hx of pneumonia   . Prolapsed uterus     Past Surgical History:  Procedure Laterality Date  . ANTERIOR HIP REVISION Left 09/09/2014   Procedure: LEFT TOTAL POSTERIOR HIP REVISION;  Surgeon: Durene Romans, MD;  Location: [facility];  Service: Orthopedics;   Laterality: Left;  . CARDIAC CATHETERIZATION N/A 02/16/2016   Procedure: Left Heart Cath and Coronary Angiography;  Surgeon: Yates Decamp, MD;  Location: [facility] INVASIVE CV LAB;  Service: Cardiovascular;  Laterality: N/A;  . HIP CLOSED REDUCTION Left 01/13/2018   Procedure: CLOSED MANIPULATION HIP;  Surgeon: Samson Frederic, MD;  Location: [facility];  Service: Orthopedics;  Laterality: Left;  . INTRAVASCULAR PRESSURE WIRE/FFR STUDY N/A 11/02/2016   Procedure: Intravascular Pressure Wire/FFR Study;  Surgeon: Yates Decamp, MD;  Location: [facility] INVASIVE CV LAB;  Service: Cardiovascular;  Laterality: N/A;  . JOINT REPLACEMENT     left hip  . LEFT HEART CATH AND CORONARY ANGIOGRAPHY N/A 11/02/2016   Procedure: Left Heart Cath and Coronary Angiography;  Surgeon: Yates Decamp, MD;  Location: [facility] INVASIVE CV LAB;  Service: Cardiovascular;  Laterality: N/A;  . left hip pinning     . LUMBAR LAMINECTOMY/DECOMPRESSION MICRODISCECTOMY Left 04/01/2015   Procedure: Left lumbar three to lumbar four microdiskectomy Right lumbar four to five discectomy redo;  Surgeon: Coletta Memos, MD;  Location: [facility];  Service: Neurosurgery;  Laterality: Left;  Left L34 microdiskectomy  . TONSILLECTOMY     Social History   Tobacco Use  . Smoking status: Former Smoker    Packs/day: 0.50    Years: 50.00    Pack years: 25.00    Types: Cigarettes  . Smokeless tobacco: Never Used  . Tobacco comment: YEAR AGO  Substance Use Topics  . Alcohol use: Yes    Comment: rare   Marital Status: Married   Review of Systems  Cardiovascular: Positive for chest pain, claudication (both leg and hips), dyspnea on exertion and leg swelling (left leg worse).  Musculoskeletal: Positive for joint pain.  Gastrointestinal: Negative for melena.  All other systems reviewed and are negative.  Objective  Blood pressure (!) 151/85, pulse 70, temperature 97.6 F (36.4 C), temperature source Oral, resp. rate 14, height 5\' 7"  (1.702 m), weight 84.4 kg, SpO2 97  %. Body mass index is 29.13 kg/m.   Vitals with BMI 02/12/2020 01/11/2020 12/04/2018  Height 5\' 7"  5\' 7"  5\' 7"   Weight 186 lbs 194 lbs 13 oz 201 lbs 8 oz  BMI 29.12 30.5 31.55  Systolic 151 109 02/03/2019  Diastolic 85 72 76  Pulse 70 69 73    Physical Exam Constitutional:      General: She is not in acute distress.    Appearance: She is well-developed.     Comments: Mildly obese  Eyes:     Conjunctiva/sclera: Conjunctivae normal.  Neck:     Thyroid: No thyromegaly.     Vascular: No JVD.  Cardiovascular:     Rate and Rhythm: Normal rate and regular rhythm.     Pulses:          Carotid pulses are 2+ on the right side and 2+ on the left side.      Femoral pulses are 1+ on the right side and 1+ on the left side.      Popliteal pulses are 2+ on the right side and 2+ on the left side.       Dorsalis pedis pulses are 0 on the right side and 0 on the left side.       Posterior tibial pulses are 2+ on the right side and 2+ on the left side.     Heart sounds: Normal heart sounds. No murmur heard.  No gallop.      Comments: Bilateral varicose veins noted, 2+ pitting edema. Pulmonary:     Effort: Pulmonary effort is normal.     Breath sounds: Normal breath sounds.  Abdominal:     General: Bowel sounds are normal.     Palpations: Abdomen is soft.    Radiology: No results found.  Laboratory examination:    CMP Latest Ref Rng & Units 01/31/2020 01/13/2018 02/15/2016  Glucose 65 - 99 mg/dL 78 96 91  BUN 8 - 27 mg/dL 16 16 18   Creatinine 0.57 - 1.00 mg/dL 683) 04/01/2020) 01/15/2018)  Sodium 134 - 144 mmol/L 138 140 139  Potassium 3.5 - 5.2 mmol/L 5.0 3.9 4.0  Chloride 96 - 106 mmol/L 99 105 108  CO2 20 - 29 mmol/L 24 26 25   Calcium 8.7 - 10.3 mg/dL 02/17/2016 9.5 )  Total Protein 6.0 - 8.5 g/dL 7.0 - 5.3(L)  Total Bilirubin 0.0 - 1.2 mg/dL 0.3 - 0.3  Alkaline Phos 44 - 121 IU/L 69 - 69  AST 0 - 40 IU/L 19 - 23  ALT 0 - 32 IU/L 23 - 24   CBC Latest Ref Rng & Units 01/31/2020 01/13/2018  02/14/2016  WBC 3.4 - 10.8 x10E3/uL 7.4 6.7 6.8  Hemoglobin 11.1 - 15.9 g/dL 21.1 9.4(R  Hematocrit 34.0 - 46.6 % 41.9 39.6 36.8  Platelets 150 - 450 x10E3/uL 227 232 217   Lipid Panel     Component Value Date/Time   CHOL 192 01/31/2020 1114  TRIG 209 (H) 01/31/2020 1114   HDL 54 01/31/2020 1114   CHOLHDL 5.0 02/15/2016 0327   VLDL 38 02/15/2016 0327   LDLCALC 102 (H) 01/31/2020 1114   HEMOGLOBIN A1C No results found for: HGBA1C, MPG TSH Recent Labs    01/31/20 1114  TSH 3.020    Medications   No current facility-administered medications on file prior to encounter.   Current Outpatient Medications on File Prior to Encounter  Medication Sig Dispense Refill  . amLODipine (NORVASC) 5 MG tablet TAKE 1 TABLET BY MOUTH EVERY DAY (Patient taking differently: Take 5 mg by mouth daily. ) 90 tablet 1  . benazepril-hydrochlorthiazide (LOTENSIN HCT) 20-25 MG tablet TAKE 1 TABLET BY MOUTH EVERY DAY IN THE MORNING (Patient taking differently: Take 1 tablet by mouth daily. ) 90 tablet 1  . buPROPion (WELLBUTRIN SR) 150 MG 12 hr tablet TAKE 1 TABLET BY MOUTH EVERY DAY (Patient taking differently: Take 150 mg by mouth daily. ) 90 tablet 2  . Chlorpheniramine-Acetaminophen (CORICIDIN HBP COLD/FLU PO) Take 2 tablets by mouth daily as needed (cold symptoms).    . diphenhydrAMINE (BENADRYL) 25 MG tablet Take 75 mg by mouth at bedtime.     . levothyroxine (SYNTHROID, LEVOTHROID) 25 MCG tablet Take 25 mcg by mouth daily.  5  . naproxen sodium (ALEVE) 220 MG tablet Take 220 mg by mouth daily as needed (headaches/pain).    . nitroGLYCERIN (NITROSTAT) 0.4 MG SL tablet Place 1 tablet (0.4 mg total) under the tongue every 5 (five) minutes as needed for chest pain. 25 tablet 2  . ranolazine (RANEXA) 1000 MG SR tablet TAKE 1 TABLET BY MOUTH TWICE A DAY (Patient taking differently: Take 1,000 mg by mouth 2 (two) times daily. ) 180 tablet 1  . metoprolol succinate (TOPROL-XL) 100 MG 24 hr tablet Take 1  tablet (100 mg total) by mouth every morning. Take with or immediately following a meal. (Patient not taking: Reported on 02/05/2020) 90 tablet 1    Cardiac Studies:   Coronary angiography 11/02/2016 with FFR to OM-2: 50% stenosis.  Hemodynamically not significant.  Otherwise no significant disease.  Normal LVEF. Stenosis in D1 not present and 80% OM now 50% compared to Coronary Angiography 02/16/2016.  Carotid artery duplex 11/21/2014: Mild stenosis in the left external carotid artery (<50%).  Follow up in one year is appropriate if clinically indicated.  Lower Extremity Arterial Duplex 11/23/2018: No hemodynamically significant stenoses are identified in the lower extremity arterial system.  This exam reveals normal perfusion of the right lower extremity (ABI 1.36) and normal perfusion of the left lower extremity (ABI 1.32).  Mildly abnormal biphasic waveform at the left ankle.  EKG:  EKG 01/11/2020: Normal sinus rhythm with rate of 66 bpm, left atrial enlargement, left axis deviation, left anterior fascicular block.  Incomplete right bundle branch block.  No evidence of ischemia.  No significant change from 12/04/2018.  Assessment   No diagnosis found.  Meds ordered this encounter  Medications  . aspirin chewable tablet 81 mg  . 0.9 %  sodium chloride infusion  . sodium chloride flush (NS) 0.9 % injection 3 mL  . sodium chloride flush (NS) 0.9 % injection 3 mL  . 0.9 %  sodium chloride infusion  . sodium chloride 0.9 % bolus 500 mL  . aspirin 81 MG chewable tablet    Faries, Bridget   : cabinet override    Medications Discontinued During This Encounter  Medication Reason  . Misc Natural Products (FOCUSED MIND PO)   Patient Preference  . Bioflavonoid Products (BIOFLEX PO) Patient Preference     Recommendations:   Kendra Mcgrath  is a 69 y.o. female  with Caucasian female with past medical history of hypertension, hyperlipidemia, tobacco use quit in April 2017, h/o recurrent left  hip dislocation s/p left hip replacement May 2016. She has chronic stable angina and has responded well to Ranexa. By coronary angiography on 11/02/2016 which essentially revealed a 50% stenosis (-ve FFR) in the marginal vessel which is improved compared to previous and no significant disease was evident in the diagonal. She also uses NTG with relief. With renaxa she has had very minimal anginal symptoms.   Patient is here today for peripheral arteriogram and possible angioplasty.  She wanted to have the procedure done under general anesthesia, advised her that it is not necessary and hopefully this will be just a diagnostic procedure and even if it is interventional procedure, it should not be that painful to be done under general anesthesia.  Otherwise no change in symptoms of claudication left hip worse than the right.  She is willing to proceed with angiography and possible angioplasty.    Yates Decamp, MD, [facility] 02/12/2020, 8:24 AM Office: [phone]

## 2020-02-12 NOTE — Progress Notes (Signed)
Primary Physician/Referring:  Tally Joe, MD  Patient ID: Kendra Mcgrath, female    DOB: [birth year], 69 y.o.   MRN: [id number]  No chief complaint on file.  HPI: Kendra Mcgrath  is a 69 y.o. 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 She has chronic stable angina and has responded well to Ranexa. By coronary angiography on 11/02/2016 which essentially revealed a 50% stenosis (-ve FFR) in the marginal vessel which is improved compared to previous and no significant disease was evident in the diagonal. She also uses NTG with relief. With renaxa she has had very minimal anginal symptoms.  I seen her about a month ago and due to persistent symptoms of claudication suggestive by symptoms in bilateral hips left worse than the right, unable to do activities of daily living without having to stop, I have scheduled her for peripheral arteriogram.  And seen her at the bedside today prior to procedure, no change in symptoms.  She requested that if I could do the procedure under general anesthesia and wanted to see me prior to the procedure.   Past Medical History:  Diagnosis Date  . Anemia    hx of   . Anginal pain (HCC)    ongoing for last 4-5 years (reported in 08/2014); had normal stress test 08/30/14 [facility])  . Headache   . History of MRSA infection    2012 per patient on left ear   . HTN (hypertension) 12/04/2018  . Hypercholesterolemia   . Hypertension   . Hypoglycemia   . Lumbar herniated disc   . Osteoarthritis of spine with radiculopathy, lumbar region 04/01/2015  . Pneumonia    hx of pneumonia   . Prolapsed uterus     Past Surgical History:  Procedure Laterality Date  . ANTERIOR HIP REVISION Left 09/09/2014   Procedure: LEFT TOTAL POSTERIOR HIP REVISION;  Surgeon: Durene Romans, MD;  Location: [facility];  Service: Orthopedics;   Laterality: Left;  . CARDIAC CATHETERIZATION N/A 02/16/2016   Procedure: Left Heart Cath and Coronary Angiography;  Surgeon: Yates Decamp, MD;  Location: [facility] INVASIVE CV LAB;  Service: Cardiovascular;  Laterality: N/A;  . HIP CLOSED REDUCTION Left 01/13/2018   Procedure: CLOSED MANIPULATION HIP;  Surgeon: Samson Frederic, MD;  Location: [facility];  Service: Orthopedics;  Laterality: Left;  . INTRAVASCULAR PRESSURE WIRE/FFR STUDY N/A 11/02/2016   Procedure: Intravascular Pressure Wire/FFR Study;  Surgeon: Yates Decamp, MD;  Location: [facility] INVASIVE CV LAB;  Service: Cardiovascular;  Laterality: N/A;  . JOINT REPLACEMENT     left hip  . LEFT HEART CATH AND CORONARY ANGIOGRAPHY N/A 11/02/2016   Procedure: Left Heart Cath and Coronary Angiography;  Surgeon: Yates Decamp, MD;  Location: [facility] INVASIVE CV LAB;  Service: Cardiovascular;  Laterality: N/A;  . left hip pinning     . LUMBAR LAMINECTOMY/DECOMPRESSION MICRODISCECTOMY Left 04/01/2015   Procedure: Left lumbar three to lumbar four microdiskectomy Right lumbar four to five discectomy redo;  Surgeon: Coletta Memos, MD;  Location: [facility];  Service: Neurosurgery;  Laterality: Left;  Left L34 microdiskectomy  . TONSILLECTOMY     Social History   Tobacco Use  . Smoking status: Former Smoker    Packs/day: 0.50    Years: 50.00    Pack years: 25.00    Types: Cigarettes  . Smokeless tobacco: Never Used  . Tobacco comment: YEAR AGO  Substance Use Topics  . Alcohol use: Yes    Comment: rare   Marital Status: Married   Review of Systems  Cardiovascular: Positive for chest pain, claudication (both leg and hips), dyspnea on exertion and leg swelling (left leg worse).  Musculoskeletal: Positive for joint pain.  Gastrointestinal: Negative for melena.  All other systems reviewed and are negative.  Objective  Blood pressure (!) 151/85, pulse 70, temperature 97.6 F (36.4 C), temperature source Oral, resp. rate 14, height 5\' 7"  (1.702 m), weight 84.4 kg, SpO2 97  %. Body mass index is 29.13 kg/m.   Vitals with BMI 02/12/2020 01/11/2020 12/04/2018  Height 5\' 7"  5\' 7"  5\' 7"   Weight 186 lbs 194 lbs 13 oz 201 lbs 8 oz  BMI 29.12 30.5 31.55  Systolic 151 109 02/03/2019  Diastolic 85 72 76  Pulse 70 69 73    Physical Exam Constitutional:      General: She is not in acute distress.    Appearance: She is well-developed.     Comments: Mildly obese  Eyes:     Conjunctiva/sclera: Conjunctivae normal.  Neck:     Thyroid: No thyromegaly.     Vascular: No JVD.  Cardiovascular:     Rate and Rhythm: Normal rate and regular rhythm.     Pulses:          Carotid pulses are 2+ on the right side and 2+ on the left side.      Femoral pulses are 1+ on the right side and 1+ on the left side.      Popliteal pulses are 2+ on the right side and 2+ on the left side.       Dorsalis pedis pulses are 0 on the right side and 0 on the left side.       Posterior tibial pulses are 2+ on the right side and 2+ on the left side.     Heart sounds: Normal heart sounds. No murmur heard.  No gallop.      Comments: Bilateral varicose veins noted, 2+ pitting edema. Pulmonary:     Effort: Pulmonary effort is normal.     Breath sounds: Normal breath sounds.  Abdominal:     General: Bowel sounds are normal.     Palpations: Abdomen is soft.    Radiology: No results found.  Laboratory examination:    CMP Latest Ref Rng & Units 01/31/2020 01/13/2018 02/15/2016  Glucose 65 - 99 mg/dL 78 96 91  BUN 8 - 27 mg/dL 16 16 18   Creatinine 0.57 - 1.00 mg/dL 683) 04/01/2020) 01/15/2018)  Sodium 134 - 144 mmol/L 138 140 139  Potassium 3.5 - 5.2 mmol/L 5.0 3.9 4.0  Chloride 96 - 106 mmol/L 99 105 108  CO2 20 - 29 mmol/L 24 26 25   Calcium 8.7 - 10.3 mg/dL 02/17/2016 9.5 )  Total Protein 6.0 - 8.5 g/dL 7.0 - 5.3(L)  Total Bilirubin 0.0 - 1.2 mg/dL 0.3 - 0.3  Alkaline Phos 44 - 121 IU/L 69 - 69  AST 0 - 40 IU/L 19 - 23  ALT 0 - 32 IU/L 23 - 24   CBC Latest Ref Rng & Units 01/31/2020 01/13/2018  02/14/2016  WBC 3.4 - 10.8 x10E3/uL 7.4 6.7 6.8  Hemoglobin 11.1 - 15.9 g/dL 21.1 9.4(R  Hematocrit 34.0 - 46.6 % 41.9 39.6 36.8  Platelets 150 - 450 x10E3/uL 227 232 217   Lipid Panel     Component Value Date/Time   CHOL 192 01/31/2020 1114  TRIG 209 (H) 01/31/2020 1114   HDL 54 01/31/2020 1114   CHOLHDL 5.0 02/15/2016 0327   VLDL 38 02/15/2016 0327   LDLCALC 102 (H) 01/31/2020 1114   HEMOGLOBIN A1C No results found for: HGBA1C, MPG TSH Recent Labs    01/31/20 1114  TSH 3.020    Medications   No current facility-administered medications on file prior to encounter.   Current Outpatient Medications on File Prior to Encounter  Medication Sig Dispense Refill  . amLODipine (NORVASC) 5 MG tablet TAKE 1 TABLET BY MOUTH EVERY DAY (Patient taking differently: Take 5 mg by mouth daily. ) 90 tablet 1  . benazepril-hydrochlorthiazide (LOTENSIN HCT) 20-25 MG tablet TAKE 1 TABLET BY MOUTH EVERY DAY IN THE MORNING (Patient taking differently: Take 1 tablet by mouth daily. ) 90 tablet 1  . buPROPion (WELLBUTRIN SR) 150 MG 12 hr tablet TAKE 1 TABLET BY MOUTH EVERY DAY (Patient taking differently: Take 150 mg by mouth daily. ) 90 tablet 2  . Chlorpheniramine-Acetaminophen (CORICIDIN HBP COLD/FLU PO) Take 2 tablets by mouth daily as needed (cold symptoms).    . diphenhydrAMINE (BENADRYL) 25 MG tablet Take 75 mg by mouth at bedtime.     Marland Kitchen. levothyroxine (SYNTHROID, LEVOTHROID) 25 MCG tablet Take 25 mcg by mouth daily.  5  . naproxen sodium (ALEVE) 220 MG tablet Take 220 mg by mouth daily as needed (headaches/pain).    . nitroGLYCERIN (NITROSTAT) 0.4 MG SL tablet Place 1 tablet (0.4 mg total) under the tongue every 5 (five) minutes as needed for chest pain. 25 tablet 2  . ranolazine (RANEXA) 1000 MG SR tablet TAKE 1 TABLET BY MOUTH TWICE A DAY (Patient taking differently: Take 1,000 mg by mouth 2 (two) times daily. ) 180 tablet 1  . metoprolol succinate (TOPROL-XL) 100 MG 24 hr tablet Take 1  tablet (100 mg total) by mouth every morning. Take with or immediately following a meal. (Patient not taking: Reported on 02/05/2020) 90 tablet 1    Cardiac Studies:   Coronary angiography 11/02/2016 with FFR to OM-2: 50% stenosis.  Hemodynamically not significant.  Otherwise no significant disease.  Normal LVEF. Stenosis in D1 not present and 80% OM now 50% compared to Coronary Angiography 02/16/2016.  Carotid artery duplex 11/21/2014: Mild stenosis in the left external carotid artery (<50%).  Follow up in one year is appropriate if clinically indicated.  Lower Extremity Arterial Duplex 11/23/2018: No hemodynamically significant stenoses are identified in the lower extremity arterial system.  This exam reveals normal perfusion of the right lower extremity (ABI 1.36) and normal perfusion of the left lower extremity (ABI 1.32).  Mildly abnormal biphasic waveform at the left ankle.  EKG:  EKG 01/11/2020: Normal sinus rhythm with rate of 66 bpm, left atrial enlargement, left axis deviation, left anterior fascicular block.  Incomplete right bundle branch block.  No evidence of ischemia.  No significant change from 12/04/2018.  Assessment   No diagnosis found.  Meds ordered this encounter  Medications  . aspirin chewable tablet 81 mg  . 0.9 %  sodium chloride infusion  . sodium chloride flush (NS) 0.9 % injection 3 mL  . sodium chloride flush (NS) 0.9 % injection 3 mL  . 0.9 %  sodium chloride infusion  . sodium chloride 0.9 % bolus 500 mL  . aspirin 81 MG chewable tablet    Kendra Mcgrath, Bridget   : cabinet override    Medications Discontinued During This Encounter  Medication Reason  . Misc Natural Products (FOCUSED MIND PO)  Patient Preference  . Bioflavonoid Products (BIOFLEX PO) Patient Preference     Recommendations:   Kendra Mcgrath  is a 69 y.o. female  with Caucasian female with past medical history of hypertension, hyperlipidemia, tobacco use quit in April 2017, h/o recurrent left  hip dislocation s/p left hip replacement May 2016. She has chronic stable angina and has responded well to Ranexa. By coronary angiography on 11/02/2016 which essentially revealed a 50% stenosis (-ve FFR) in the marginal vessel which is improved compared to previous and no significant disease was evident in the diagonal. She also uses NTG with relief. With renaxa she has had very minimal anginal symptoms.   Patient is here today for peripheral arteriogram and possible angioplasty.  She wanted to have the procedure done under general anesthesia, advised her that it is not necessary and hopefully this will be just a diagnostic procedure and even if it is interventional procedure, it should not be that painful to be done under general anesthesia.  Otherwise no change in symptoms of claudication left hip worse than the right.  She is willing to proceed with angiography and possible angioplasty.    Yates Decamp, MD, [facility] 02/12/2020, 8:24 AM Office: [phone]

## 2020-02-12 NOTE — Progress Notes (Signed)
Ambulated in hallway and to the bathroom to void tol well. No bleeding or swelling noted to right groin  Before or after ambulation.

## 2020-02-20 ENCOUNTER — Other Ambulatory Visit: Payer: Self-pay

## 2020-02-20 ENCOUNTER — Ambulatory Visit: Payer: Medicare Other | Admitting: Cardiology

## 2020-02-20 ENCOUNTER — Encounter: Payer: Self-pay | Admitting: Cardiology

## 2020-02-20 VITALS — BP 127/73 | HR 71 | Resp 16 | Ht 67.0 in | Wt 188.0 lb

## 2020-02-20 DIAGNOSIS — M792 Neuralgia and neuritis, unspecified: Secondary | ICD-10-CM | POA: Diagnosis not present

## 2020-02-20 DIAGNOSIS — M48062 Spinal stenosis, lumbar region with neurogenic claudication: Secondary | ICD-10-CM | POA: Diagnosis not present

## 2020-02-20 DIAGNOSIS — I10 Essential (primary) hypertension: Secondary | ICD-10-CM

## 2020-02-20 DIAGNOSIS — I25118 Atherosclerotic heart disease of native coronary artery with other forms of angina pectoris: Secondary | ICD-10-CM | POA: Diagnosis not present

## 2020-02-20 MED ORDER — PREGABALIN 75 MG PO CAPS: 75.0000 mg | ORAL_CAPSULE | Freq: Two times a day (BID) | ORAL | 1 refills | Status: DC

## 2020-02-20 NOTE — Progress Notes (Signed)
Primary Physician/Referring:  Tally Joe, MD  Patient ID: Kendra Mcgrath, female    DOB: 01-Nov-1950, 69 y.o.   MRN: [id number]  Chief Complaint  Patient presents with  . Follow-up    4 week  . PAD  . Hypertension  . Hyperlipidemia   HPI: Kendra Mcgrath  is a 69 y.o. 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 She has chronic stable angina and has responded well to Ranexa. By coronary angiography on 11/02/2016 which essentially revealed a 50% stenosis (-ve FFR) in the marginal vessel which is improved compared to previous and no significant disease was evident in the diagonal. She also uses NTG with relief. With renaxa she has had very minimal anginal symptoms.   I have seen her a year ago, she has been complaining about severe bilateral hip pain left worse than the right.  Due to this underwent peripheral arteriogram on 02/12/2020, revealing no significant disease.  She now presents for follow-up.  She has not had any complication from the procedure.  No change in symptoms.  She has not had any chest pain, states that her blood pressures well controlled.  He is wondering why she is still having pain.   Past Medical History:  Diagnosis Date  . Anemia    hx of   . Anginal pain (HCC)    ongoing for last 4-5 years (reported in 08/2014); had normal stress test 08/30/14 [facility])  . Headache   . History of MRSA infection    2012 per patient on left ear   . HTN (hypertension) 12/04/2018  . Hypercholesterolemia   . Hypertension   . Hypoglycemia   . Lumbar herniated disc   . Osteoarthritis of spine with radiculopathy, lumbar region 04/01/2015  . Pneumonia    hx of pneumonia   . Prolapsed uterus     Past Surgical History:  Procedure Laterality Date  . ABDOMINAL AORTOGRAM W/LOWER EXTREMITY N/A 02/12/2020   Procedure: ABDOMINAL AORTOGRAM  W/LOWER EXTREMITY;  Surgeon: Yates Decamp, MD;  Location: [facility] INVASIVE CV LAB;  Service: Cardiovascular;  Laterality: N/A;  . ANTERIOR HIP REVISION Left 09/09/2014   Procedure: LEFT TOTAL POSTERIOR HIP REVISION;  Surgeon: Durene Romans, MD;  Location: [facility] ORS;  Service: Orthopedics;  Laterality: Left;  . CARDIAC CATHETERIZATION N/A 02/16/2016   Procedure: Left Heart Cath and Coronary Angiography;  Surgeon: Yates Decamp, MD;  Location: [facility] INVASIVE CV LAB;  Service: Cardiovascular;  Laterality: N/A;  . HIP CLOSED REDUCTION Left 01/13/2018   Procedure: CLOSED MANIPULATION HIP;  Surgeon: Samson Frederic, MD;  Location: [facility] ORS;  Service: Orthopedics;  Laterality: Left;  . INTRAVASCULAR PRESSURE WIRE/FFR STUDY N/A 11/02/2016   Procedure: Intravascular Pressure Wire/FFR Study;  Surgeon: Yates Decamp, MD;  Location: [facility] INVASIVE CV LAB;  Service: Cardiovascular;  Laterality: N/A;  . JOINT REPLACEMENT     left hip  . LEFT HEART CATH AND CORONARY ANGIOGRAPHY N/A 11/02/2016   Procedure: Left Heart Cath and Coronary Angiography;  Surgeon: Yates Decamp, MD;  Location: [facility] INVASIVE CV LAB;  Service: Cardiovascular;  Laterality: N/A;  . left hip pinning     . LOWER EXTREMITY ANGIOGRAPHY Bilateral 02/12/2020   Procedure: Bilateral LOWER EXTREMITY ANGIOGRAPHY;  Surgeon: Yates Decamp, MD;  Location: [facility] INVASIVE CV LAB;  Service: Cardiovascular;  Laterality: Bilateral;  . LUMBAR LAMINECTOMY/DECOMPRESSION MICRODISCECTOMY Left 04/01/2015   Procedure: Left lumbar three to  lumbar four microdiskectomy Right lumbar four to five discectomy redo;  Surgeon: Coletta Memos, MD;  Location: [facility] NEURO ORS;  Service: Neurosurgery;  Laterality: Left;  Left L34 microdiskectomy  . TONSILLECTOMY     Social History   Tobacco Use  . Smoking status: Former Smoker    Packs/day: 0.50    Years: 50.00    Pack years: 25.00    Types: Cigarettes  . Smokeless tobacco: Never Used  . Tobacco comment: YEAR AGO  Substance Use Topics  . Alcohol use: Yes     Comment: rare   Marital Status: Married   Review of Systems  Cardiovascular: Positive for chest pain, claudication (both leg and hips), dyspnea on exertion and leg swelling (left leg worse).  Musculoskeletal: Positive for joint pain.  Gastrointestinal: Negative for melena.  All other systems reviewed and are negative.  Objective  Blood pressure 127/73, pulse 71, resp. rate 16, height 5\' 7"  (1.702 m), weight 188 lb (85.3 kg), SpO2 98 %. Body mass index is 29.44 kg/m. Physical Exam Constitutional:      General: She is not in acute distress.    Appearance: She is well-developed.     Comments: Mildly obese  Eyes:     Conjunctiva/sclera: Conjunctivae normal.  Neck:     Thyroid: No thyromegaly.     Vascular: No JVD.  Cardiovascular:     Rate and Rhythm: Normal rate and regular rhythm.     Pulses:          Carotid pulses are 2+ on the right side and 2+ on the left side.      Femoral pulses are 1+ on the right side and 1+ on the left side.      Popliteal pulses are 2+ on the right side and 2+ on the left side.       Dorsalis pedis pulses are 0 on the right side and 0 on the left side.       Posterior tibial pulses are 2+ on the right side and 2+ on the left side.     Heart sounds: Normal heart sounds. No murmur heard.  No gallop.      Comments: Bilateral varicose veins noted, 2+ pitting edema. Pulmonary:     Effort: Pulmonary effort is normal.     Breath sounds: Normal breath sounds.  Abdominal:     General: Bowel sounds are normal.     Palpations: Abdomen is soft.    Radiology: No results found.  Laboratory examination:    CMP Latest Ref Rng & Units 01/31/2020 01/13/2018 02/15/2016  Glucose 65 - 99 mg/dL 78 96 91  BUN 8 - 27 mg/dL 16 16 18   Creatinine 0.57 - 1.00 mg/dL 02/17/2016) ) 8.28(M)  Sodium 134 - 144 mmol/L 138 140 139  Potassium 3.5 - 5.2 mmol/L 5.0 3.9 4.0  Chloride 96 - 106 mmol/L 99 105 108  CO2 20 - 29 mmol/L 24 26 25   Calcium 8.7 - 10.3 mg/dL 0.34(J 9.5  1.79(X)  Total Protein 6.0 - 8.5 g/dL 7.0 - 5.3(L)  Total Bilirubin 0.0 - 1.2 mg/dL 0.3 - 0.3  Alkaline Phos 44 - 121 IU/L 69 - 69  AST 0 - 40 IU/L 19 - 23  ALT 0 - 32 IU/L 23 - 24   CBC Latest Ref Rng & Units 01/31/2020 01/13/2018 02/14/2016  WBC 3.4 - 10.8 x10E3/uL 7.4 6.7 6.8  Hemoglobin 11.1 - 15.9 g/dL 04/01/2020 01/15/2018 02/16/2016  Hematocrit 34.0 - 46.6 % 41.9 39.6 36.8  Platelets 150 - 450 x10E3/uL 227 232 217   Lipid Panel     Component Value Date/Time   CHOL 192 01/31/2020 1114   TRIG 209 (H) 01/31/2020 1114   HDL 54 01/31/2020 1114   CHOLHDL 5.0 02/15/2016 0327   VLDL 38 02/15/2016 0327   LDLCALC 102 (H) 01/31/2020 1114   HEMOGLOBIN A1C No results found for: HGBA1C, MPG TSH Recent Labs    01/31/20 1114  TSH 3.020    Medications   Current Outpatient Medications on File Prior to Visit  Medication Sig Dispense Refill  . amLODipine (NORVASC) 5 MG tablet TAKE 1 TABLET BY MOUTH EVERY DAY (Patient taking differently: Take 5 mg by mouth daily. ) 90 tablet 1  . aspirin EC 81 MG tablet Take 1 tablet (81 mg total) by mouth daily. 90 tablet 3  . benazepril-hydrochlorthiazide (LOTENSIN HCT) 20-25 MG tablet TAKE 1 TABLET BY MOUTH EVERY DAY IN THE MORNING (Patient taking differently: Take 1 tablet by mouth daily. ) 90 tablet 1  . buPROPion (WELLBUTRIN SR) 150 MG 12 hr tablet TAKE 1 TABLET BY MOUTH EVERY DAY (Patient taking differently: Take 150 mg by mouth as needed. ) 90 tablet 2  . diphenhydrAMINE (BENADRYL) 25 MG tablet Take 75 mg by mouth at bedtime.     Marland Kitchen levothyroxine (SYNTHROID, LEVOTHROID) 25 MCG tablet Take 25 mcg by mouth daily.  5  . naproxen sodium (ALEVE) 220 MG tablet Take 220 mg by mouth daily as needed (headaches/pain).    . nitroGLYCERIN (NITROSTAT) 0.4 MG SL tablet Place 1 tablet (0.4 mg total) under the tongue every 5 (five) minutes as needed for chest pain. 25 tablet 2  . ranolazine (RANEXA) 1000 MG SR tablet TAKE 1 TABLET BY MOUTH TWICE A DAY (Patient taking differently:  Take 1,000 mg by mouth 2 (two) times daily. ) 180 tablet 1  . rosuvastatin (CRESTOR) 20 MG tablet Take 1 tablet (20 mg total) by mouth daily. 90 tablet 1  . Chlorpheniramine-Acetaminophen (CORICIDIN HBP COLD/FLU PO) Take 2 tablets by mouth daily as needed (cold symptoms). (Patient not taking: Reported on 02/20/2020)     No current facility-administered medications on file prior to visit.    Cardiac Studies:   Coronary angiography 11/02/2016 with FFR to OM-2: 50% stenosis.  Hemodynamically not significant.  Otherwise no significant disease.  Normal LVEF. Stenosis in D1 not present and 80% OM now 50% compared to Coronary Angiography 02/16/2016.  Carotid artery duplex 11/21/2014: Mild stenosis in the left external carotid artery (<50%).  Follow up in one year is appropriate if clinically indicated.  Lower Extremity Arterial Duplex 11/23/2018: No hemodynamically significant stenoses are identified in the lower extremity arterial system.  This exam reveals normal perfusion of the right lower extremity (ABI 1.36) and normal perfusion of the left lower extremity (ABI 1.32).  Mildly abnormal biphasic waveform at the left ankle.  Abdominal aortogram and peripheral arteriogram with lower extremity bilateral runoff 02/12/2020: No significant peripheral arterial disease.  There are 2 renal arteries on either side which are widely patent.  There is mild atherosclerotic changes noted in the abdominal aorta. Bilateral iliac arteries show mild disease.  Otherwise there is smooth vessels below the inguinal ligament with three-vessel runoff below the bilateral knee.  Impression: Please evaluate for pseudoclaudication.  EKG:  EKG 01/11/2020: Normal sinus rhythm with rate of 66 bpm, left atrial enlargement, left axis deviation, left anterior fascicular block.  Incomplete right bundle branch block.  No evidence of ischemia.  No significant change  from 12/04/2018.  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 Neuropathic pain  M79.2 pregabalin (LYRICA) 75 MG capsule  3. Coronary artery disease of native artery of native heart with stable angina pectoris (HCC)  I25.118   4. Essential hypertension  I10     Meds ordered this encounter  Medications  . pregabalin (LYRICA) 75 MG capsule    Sig: Take 1 capsule (75 mg total) by mouth 2 (two) times daily.    Dispense:  60 capsule    Refill:  1    There are no discontinued medications.   Recommendations:   Kendra Harrisamela Cockrell  is a 69 y.o. 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 She has chronic stable angina and has responded well to Ranexa. By coronary angiography on 11/02/2016 which essentially revealed a 50% stenosis (-ve FFR) in the marginal vessel which is improved compared to previous and no significant disease was evident in the diagonal. She also uses NTG with relief. With renaxa she has had very minimal anginal symptoms.    Due to persistent limb-girdle pain, underwent peripheral arteriogram on 02/12/2020, revealing no significant PAD.  I suspect neuropathic pain.  I would like to try Lyrica 75 mg p.o. twice daily and if she sees any improvement she could also try to go to 150 mg p.o. twice daily.  If she will tolerate this and feels better I will request a PCP to do the need for regarding refill.  Please evaluate her for pseudoclaudication.  With regard to coronary artery disease she has not had any recurrence of angina pectoris, blood pressures well controlled, she is on a statin.  Continue the same and I will see her back in a year. She was started on Crestor and will need f/u lipids at some point.    Yates DecampJay Remona Boom, MD, [facility] 02/20/2020, 10:07 AM Office: [phone]

## 2020-02-25 ENCOUNTER — Other Ambulatory Visit: Payer: Self-pay | Admitting: Cardiology

## 2020-02-26 ENCOUNTER — Telehealth: Payer: Self-pay

## 2020-02-26 NOTE — Telephone Encounter (Signed)
Insurance denied lyrica, is there something else you can prescribe?

## 2020-02-27 NOTE — Telephone Encounter (Signed)
Gabapentin 200 mg TID if she is willing to try

## 2020-02-28 NOTE — Telephone Encounter (Signed)
Called patient, call went straight to [facility]. LMAM for patient to callback.

## 2020-03-03 ENCOUNTER — Other Ambulatory Visit: Payer: Self-pay | Admitting: Cardiology

## 2020-03-03 DIAGNOSIS — E782 Mixed hyperlipidemia: Secondary | ICD-10-CM

## 2020-03-18 ENCOUNTER — Other Ambulatory Visit: Payer: Self-pay | Admitting: Cardiology

## 2020-03-18 DIAGNOSIS — I10 Essential (primary) hypertension: Secondary | ICD-10-CM

## 2020-04-23 ENCOUNTER — Other Ambulatory Visit: Payer: Self-pay | Admitting: Cardiology

## 2020-04-23 DIAGNOSIS — Z87891 Personal history of nicotine dependence: Secondary | ICD-10-CM

## 2020-04-23 DIAGNOSIS — I10 Essential (primary) hypertension: Secondary | ICD-10-CM

## 2020-05-05 DIAGNOSIS — M25552 Pain in left hip: Secondary | ICD-10-CM | POA: Diagnosis not present

## 2020-05-05 DIAGNOSIS — E7800 Pure hypercholesterolemia, unspecified: Secondary | ICD-10-CM | POA: Diagnosis not present

## 2020-05-05 DIAGNOSIS — I2510 Atherosclerotic heart disease of native coronary artery without angina pectoris: Secondary | ICD-10-CM | POA: Diagnosis not present

## 2020-05-05 DIAGNOSIS — Z7189 Other specified counseling: Secondary | ICD-10-CM | POA: Diagnosis not present

## 2020-05-05 DIAGNOSIS — E039 Hypothyroidism, unspecified: Secondary | ICD-10-CM | POA: Diagnosis not present

## 2020-05-05 DIAGNOSIS — F339 Major depressive disorder, recurrent, unspecified: Secondary | ICD-10-CM | POA: Diagnosis not present

## 2020-05-05 DIAGNOSIS — N183 Chronic kidney disease, stage 3 unspecified: Secondary | ICD-10-CM | POA: Diagnosis not present

## 2020-05-05 DIAGNOSIS — I10 Essential (primary) hypertension: Secondary | ICD-10-CM | POA: Diagnosis not present

## 2020-05-05 DIAGNOSIS — R609 Edema, unspecified: Secondary | ICD-10-CM | POA: Diagnosis not present

## 2020-05-17 ENCOUNTER — Other Ambulatory Visit: Payer: Self-pay | Admitting: Cardiology

## 2020-05-17 DIAGNOSIS — E782 Mixed hyperlipidemia: Secondary | ICD-10-CM

## 2020-08-11 ENCOUNTER — Other Ambulatory Visit: Payer: Self-pay | Admitting: Cardiology

## 2020-08-11 DIAGNOSIS — E782 Mixed hyperlipidemia: Secondary | ICD-10-CM

## 2020-10-09 ENCOUNTER — Other Ambulatory Visit: Payer: Self-pay | Admitting: Cardiology

## 2020-10-09 DIAGNOSIS — I10 Essential (primary) hypertension: Secondary | ICD-10-CM

## 2020-11-12 ENCOUNTER — Telehealth: Payer: Self-pay | Admitting: Cardiology

## 2020-11-12 ENCOUNTER — Other Ambulatory Visit: Payer: Self-pay | Admitting: Cardiology

## 2020-11-12 DIAGNOSIS — E782 Mixed hyperlipidemia: Secondary | ICD-10-CM

## 2020-11-12 MED ORDER — ATORVASTATIN CALCIUM 40 MG PO TABS: 40.0000 mg | ORAL_TABLET | Freq: Every day | ORAL | 2 refills | Status: DC

## 2020-11-12 NOTE — Telephone Encounter (Signed)
Chief Complaint  Patient presents with   Medication Refill    Needs to change Crestor to another due to cost     ICD-10-CM   1. Mixed hyperlipidemia  E78.2 atorvastatin (LIPITOR) 40 MG tablet    Lipid Panel With LDL/HDL Ratio     Orders Placed This Encounter  Procedures   Lipid Panel With LDL/HDL Ratio    Standing Status:   Future    Standing Expiration Date:   11/12/2021    Meds ordered this encounter  Medications   atorvastatin (LIPITOR) 40 MG tablet    Sig: Take 1 tablet (40 mg total) by mouth daily.    Dispense:  30 tablet    Refill:  2    Medications Discontinued During This Encounter  Medication Reason   rosuvastatin (CRESTOR) 10 MG tablet Cost of medication   rosuvastatin (CRESTOR) 20 MG tablet Cost of medication     Yates Decamp, MD, [facility] 11/12/2020, 1:10 PM Office: [phone] Fax: [phone] Pager: [phone]

## 2020-11-17 ENCOUNTER — Other Ambulatory Visit: Payer: Self-pay

## 2021-01-01 ENCOUNTER — Other Ambulatory Visit: Payer: Self-pay | Admitting: Cardiology

## 2021-01-01 DIAGNOSIS — E782 Mixed hyperlipidemia: Secondary | ICD-10-CM

## 2021-01-08 ENCOUNTER — Other Ambulatory Visit: Payer: Self-pay

## 2021-01-08 ENCOUNTER — Telehealth: Payer: Self-pay | Admitting: Cardiology

## 2021-01-08 MED ORDER — RANOLAZINE ER 1000 MG PO TB12
1000.0000 mg | ORAL_TABLET | Freq: Two times a day (BID) | ORAL | 1 refills | Status: DC
Start: 2021-01-08 — End: 2021-02-23

## 2021-01-08 NOTE — Telephone Encounter (Signed)
Pt requesting refill for ranexa; says she has been trying to complete refill on "follow my health" but it will not allow her to do so. Has been out of this medication for over a week.

## 2021-02-19 ENCOUNTER — Ambulatory Visit: Payer: 59 | Admitting: Cardiology

## 2021-02-23 ENCOUNTER — Encounter: Payer: Self-pay | Admitting: Cardiology

## 2021-02-23 ENCOUNTER — Ambulatory Visit: Payer: Medicare Other | Admitting: Cardiology

## 2021-02-23 ENCOUNTER — Other Ambulatory Visit: Payer: Self-pay

## 2021-02-23 VITALS — BP 128/79 | HR 68 | Temp 98.5°F | Resp 17 | Ht 67.0 in | Wt 203.0 lb

## 2021-02-23 DIAGNOSIS — E782 Mixed hyperlipidemia: Secondary | ICD-10-CM

## 2021-02-23 DIAGNOSIS — I10 Essential (primary) hypertension: Secondary | ICD-10-CM | POA: Diagnosis not present

## 2021-02-23 DIAGNOSIS — I2510 Atherosclerotic heart disease of native coronary artery without angina pectoris: Secondary | ICD-10-CM | POA: Diagnosis not present

## 2021-02-23 NOTE — Progress Notes (Signed)
Primary Physician/Referring:  Antony Contras, MD  Patient ID: Kendra Mcgrath, female    DOB: 1950-06-12, 70 y.o.   MRN: [id number]  Chief Complaint  Patient presents with   Chest Pain    1 YEAR   HPI: Kendra Mcgrath  is a 70 y.o. female  with Caucasian female with past medical history of hypertension, hyperlipidemia, tobacco use quit in April 2017, h/o  recurrent left hip dislocation s/p left hip replacement May 2016. She has chronic stable angina and has responded well to Ranexa. By coronary angiography on 11/02/2016 which essentially revealed a 50% stenosis (-ve FFR) in the marginal vessel which is improved compared to previous and no significant disease was evident in the diagonal.    This annual visit.  States that she ran out of Ranexa and has discontinued this over the past 2 months but has not had any recurrence of angina pectoris and she has not used any sublingual nitroglycerin.  Since then she has noticed resolution of leg edema.  No other specific symptoms except continues to have difficulty with weight loss and her physical activity has been limited due to bilateral knee arthritis.      Past Medical History:  Diagnosis Date   Anemia    hx of    Anginal pain ([place])    ongoing for last 4-5 years (reported in 08/2014); had normal stress test 08/30/14 [facility] CV)   Headache    History of MRSA infection    2012 per patient on left ear    HTN (hypertension) 12/04/2018   Hypercholesterolemia    Hypertension    Hypoglycemia    Lumbar herniated disc    Osteoarthritis of spine with radiculopathy, lumbar region 04/01/2015   Pneumonia    hx of pneumonia    Prolapsed uterus     Past Surgical History:  Procedure Laterality Date   ABDOMINAL AORTOGRAM W/LOWER EXTREMITY N/A 02/12/2020   Procedure: ABDOMINAL AORTOGRAM W/LOWER EXTREMITY;  Surgeon: Adrian Prows, MD;  Location: [place] CV LAB;  Service: Cardiovascular;  Laterality: N/A;   ANTERIOR HIP REVISION Left 09/09/2014   Procedure:  LEFT TOTAL POSTERIOR HIP REVISION;  Surgeon: Paralee Cancel, MD;  Location: [facility];  Service: Orthopedics;  Laterality: Left;   CARDIAC CATHETERIZATION N/A 02/16/2016   Procedure: Left Heart Cath and Coronary Angiography;  Surgeon: Adrian Prows, MD;  Location: [place] CV LAB;  Service: Cardiovascular;  Laterality: N/A;   HIP CLOSED REDUCTION Left 01/13/2018   Procedure: CLOSED MANIPULATION HIP;  Surgeon: Rod Can, MD;  Location: [facility]S;  Service: Orthopedics;  Laterality: Left;   INTRAVASCULAR PRESSURE WIRE/FFR STUDY N/A 11/02/2016   Procedure: Intravascular Pressure Wire/FFR Study;  Surgeon: Adrian Prows, MD;  Location: [place] CV LAB;  Service: Cardiovascular;  Laterality: N/A;   JOINT REPLACEMENT     left hip   LEFT HEART CATH AND CORONARY ANGIOGRAPHY N/A 11/02/2016   Procedure: Left Heart Cath and Coronary Angiography;  Surgeon: Adrian Prows, MD;  Location: [place] CV LAB;  Service: Cardiovascular;  Laterality: N/A;   left hip pinning      LOWER EXTREMITY ANGIOGRAPHY Bilateral 02/12/2020   Procedure: Bilateral LOWER EXTREMITY ANGIOGRAPHY;  Surgeon: Adrian Prows, MD;  Location: [place] CV LAB;  Service: Cardiovascular;  Laterality: Bilateral;   LUMBAR LAMINECTOMY/DECOMPRESSION MICRODISCECTOMY Left 04/01/2015   Procedure: Left lumbar three to lumbar four microdiskectomy Right lumbar four to five discectomy redo;  Surgeon: Ashok Pall, MD;  Location: [place] NEURO ORS;  Service: Neurosurgery;  Laterality: Left;  Left L34 microdiskectomy   TONSILLECTOMY     Social History   Tobacco Use   Smoking status: Former    Packs/day: 0.50    Years: 50.00    Pack years: 25.00    Types: Cigarettes   Smokeless tobacco: Never   Tobacco comments:    YEAR AGO  Substance Use Topics   Alcohol use: Yes    Comment: rare   Marital Status: Married   Review of Systems  Cardiovascular:  Positive for chest pain, claudication (both leg and hips), dyspnea on exertion and leg swelling (left leg worse).   Musculoskeletal:  Positive for joint pain.  Gastrointestinal:  Negative for melena.  All other systems reviewed and are negative. Objective  Blood pressure 128/79, pulse 68, temperature 98.5 F (36.9 C), temperature source Temporal, resp. rate 17, height 5\' 7"  (1.702 m), weight 203 lb (92.1 kg), SpO2 100 %. Body mass index is 31.79 kg/m.  Vitals with BMI 02/23/2021 02/23/2021 02/20/2020  Height - 5\' 7"  5\' 7"   Weight - 203 lbs 188 lbs  BMI - [id number]  Systolic 0000000 [id number]  Diastolic 79 75 73  Pulse 68 74 71     Physical Exam Neck:     Vascular: No carotid bruit or JVD.  Cardiovascular:     Rate and Rhythm: Normal rate and regular rhythm.     Pulses: Intact distal pulses.     Heart sounds: Normal heart sounds. No murmur heard.   No gallop.  Pulmonary:     Effort: Pulmonary effort is normal.     Breath sounds: Normal breath sounds.  Abdominal:     General: Bowel sounds are normal.     Palpations: Abdomen is soft.  Musculoskeletal:        General: No swelling.   Radiology: No results found.  Laboratory examination:    CMP Latest Ref Rng & Units 01/31/2020 01/13/2018 02/15/2016  Glucose 65 - 99 mg/dL 78 96 91  BUN 8 - 27 mg/dL 16 16 18   Creatinine 0.57 - 1.00 mg/dL 1.13(H) 1.04(H) 1.08(H)  Sodium 134 - 144 mmol/L 138 140 139  Potassium 3.5 - 5.2 mmol/L 5.0 3.9 4.0  Chloride 96 - 106 mmol/L 99 105 108  CO2 20 - 29 mmol/L 24 26 25   Calcium 8.7 - 10.3 mg/dL 10.1 9.5 8.6(L)  Total Protein 6.0 - 8.5 g/dL 7.0 - 5.3(L)  Total Bilirubin 0.0 - 1.2 mg/dL 0.3 - 0.3  Alkaline Phos 44 - 121 IU/L 69 - 69  AST 0 - 40 IU/L 19 - 23  ALT 0 - 32 IU/L 23 - 24   CBC Latest Ref Rng & Units 01/31/2020 01/13/2018 02/14/2016  WBC 3.4 - 10.8 x10E3/uL 7.4 6.7 6.8  Hemoglobin 11.1 - 15.9 g/dL 14.3 13.6 12.3  Hematocrit 34.0 - 46.6 % 41.9 39.6 36.8  Platelets 150 - 450 x10E3/uL 227 232 217   Lipid Panel     Component Value Date/Time   CHOL 192 01/31/2020 1114   TRIG 209 (H)  01/31/2020 1114   HDL 54 01/31/2020 1114   CHOLHDL 5.0 02/15/2016 0327   VLDL 38 02/15/2016 0327   LDLCALC 102 (H) 01/31/2020 1114   HEMOGLOBIN A1C No results found for: HGBA1C, MPG TSH No results for input(s): TSH in the last 8760 hours.   Medications   Current Outpatient Medications on File Prior to Visit  Medication Sig Dispense Refill   amLODipine (NORVASC) 5 MG tablet TAKE 1 TABLET BY MOUTH EVERY DAY (Patient taking  differently: Take 5 mg by mouth daily.) 90 tablet 3   aspirin EC 81 MG tablet Take 1 tablet (81 mg total) by mouth daily. (Patient taking differently: Take 81 mg by mouth as needed.) 90 tablet 3   atorvastatin (LIPITOR) 40 MG tablet TAKE 1 TABLET BY MOUTH EVERY DAY 90 tablet 0   benazepril-hydrochlorthiazide (LOTENSIN HCT) 20-25 MG tablet TAKE 1 TABLET BY MOUTH EVERY DAY IN THE MORNING 30 tablet 5   buPROPion (WELLBUTRIN SR) 150 MG 12 hr tablet TAKE 1 TABLET BY MOUTH EVERY DAY 90 tablet 3   diphenhydrAMINE (BENADRYL) 25 MG tablet Take 75 mg by mouth at bedtime.      levothyroxine (SYNTHROID, LEVOTHROID) 25 MCG tablet Take 25 mcg by mouth daily.  5   naproxen sodium (ALEVE) 220 MG tablet Take 220 mg by mouth daily as needed (headaches/pain).     nitroGLYCERIN (NITROSTAT) 0.4 MG SL tablet Place 1 tablet (0.4 mg total) under the tongue every 5 (five) minutes as needed for chest pain. 25 tablet 2   No current facility-administered medications on file prior to visit.    Cardiac Studies:   Coronary angiography 11/02/2016 with FFR to OM-2: 50% stenosis.  Hemodynamically not significant.  Otherwise no significant disease.  Normal LVEF. Stenosis in D1 not present and 80% OM now 50% compared to Coronary Angiography 02/16/2016.  Carotid artery duplex 11/21/2014: Mild stenosis in the left external carotid artery (<50%).  Follow up in one year is appropriate if clinically indicated.  Lower Extremity Arterial Duplex 11/23/2018: No hemodynamically significant stenoses are  identified in the lower extremity arterial system.  This exam reveals normal perfusion of the right lower extremity (ABI 1.36) and normal perfusion of the left lower extremity (ABI 1.32).  Mildly abnormal biphasic waveform at the left ankle.  Abdominal aortogram and peripheral arteriogram with lower extremity bilateral runoff 02/12/2020: No significant peripheral arterial disease.  There are 2 renal arteries on either side which are widely patent.  There is mild atherosclerotic changes noted in the abdominal aorta. Bilateral iliac arteries show mild disease.  Otherwise there is smooth vessels below the inguinal ligament with three-vessel runoff below the bilateral knee.   Impression: Please evaluate for pseudoclaudication.  EKG:  EKG 02/23/2021: Normal sinus rhythm at rate of 62 bpm, left axis deviation, left anterior fascicular block.  Incomplete right bundle branch block.  No significant change from 01/11/2020.  Assessment     ICD-10-CM   1. Coronary artery disease involving native coronary artery of native heart without angina pectoris  I25.10     2. Essential hypertension  I10 EKG 12-Lead    3. Mixed hyperlipidemia  E78.2       No orders of the defined types were placed in this encounter.   Medications Discontinued During This Encounter  Medication Reason   pregabalin (LYRICA) 75 MG capsule Error   ranolazine (RANEXA) 1000 MG SR tablet No longer needed (for PRN medications)     Recommendations:   Kendra Mcgrath  is a 70 y.o. female  with Caucasian female with past medical history of hypertension, hyperlipidemia, tobacco use quit in April 2017, h/o  recurrent left hip dislocation s/p left hip replacement May 2016. She has chronic stable angina and has responded well to Ranexa. By coronary angiography on 11/02/2016 which essentially revealed a 50% stenosis (-ve FFR) in the marginal vessel which is improved compared to previous and no significant disease was evident in the diagonal.      With regard to coronary  artery disease she has not had any recurrence of angina pectoris, blood pressures well controlled, she is on a statin.  She discontinued Ranexa as she had ran out of the prescription and since then she has noticed resolution of leg edema.  She has not had any recurrence of angina pectoris, we will discontinue Ranexa.  Except for very mild disease in her obtuse marginal vessel, she has no significant coronary artery disease, I will see her back on a as needed basis.  Continue primary prevention with aggressive control of her lipids.  No change in her physical exam.  I encouraged her to make an appointment to see her PCP for follow-up and management of hypertension, hyperlipidemia and also arthritis and weight loss.      Adrian Prows, MD, [facility] 02/23/2021, 10:08 AM Office: [phone]

## 2021-04-06 ENCOUNTER — Other Ambulatory Visit: Payer: Self-pay | Admitting: Cardiology

## 2021-04-06 DIAGNOSIS — I10 Essential (primary) hypertension: Secondary | ICD-10-CM

## 2021-04-16 DIAGNOSIS — Z20822 Contact with and (suspected) exposure to covid-19: Secondary | ICD-10-CM | POA: Diagnosis not present

## 2021-05-02 ENCOUNTER — Other Ambulatory Visit: Payer: Self-pay | Admitting: Cardiology

## 2021-05-02 DIAGNOSIS — E782 Mixed hyperlipidemia: Secondary | ICD-10-CM

## 2021-06-01 ENCOUNTER — Other Ambulatory Visit: Payer: Self-pay | Admitting: Family Medicine

## 2021-06-01 DIAGNOSIS — E2839 Other primary ovarian failure: Secondary | ICD-10-CM

## 2021-06-01 DIAGNOSIS — E7800 Pure hypercholesterolemia, unspecified: Secondary | ICD-10-CM | POA: Diagnosis not present

## 2021-06-01 DIAGNOSIS — I700 Atherosclerosis of aorta: Secondary | ICD-10-CM | POA: Diagnosis not present

## 2021-06-01 DIAGNOSIS — I10 Essential (primary) hypertension: Secondary | ICD-10-CM | POA: Diagnosis not present

## 2021-06-01 DIAGNOSIS — Z1389 Encounter for screening for other disorder: Secondary | ICD-10-CM | POA: Diagnosis not present

## 2021-06-01 DIAGNOSIS — Z23 Encounter for immunization: Secondary | ICD-10-CM | POA: Diagnosis not present

## 2021-06-01 DIAGNOSIS — F339 Major depressive disorder, recurrent, unspecified: Secondary | ICD-10-CM | POA: Diagnosis not present

## 2021-06-01 DIAGNOSIS — N1832 Chronic kidney disease, stage 3b: Secondary | ICD-10-CM | POA: Diagnosis not present

## 2021-06-01 DIAGNOSIS — L309 Dermatitis, unspecified: Secondary | ICD-10-CM | POA: Diagnosis not present

## 2021-06-01 DIAGNOSIS — Z0000 Encounter for general adult medical examination without abnormal findings: Secondary | ICD-10-CM | POA: Diagnosis not present

## 2021-06-01 DIAGNOSIS — E039 Hypothyroidism, unspecified: Secondary | ICD-10-CM | POA: Diagnosis not present

## 2021-06-01 DIAGNOSIS — Z1211 Encounter for screening for malignant neoplasm of colon: Secondary | ICD-10-CM | POA: Diagnosis not present

## 2021-06-05 DIAGNOSIS — Z1211 Encounter for screening for malignant neoplasm of colon: Secondary | ICD-10-CM | POA: Diagnosis not present

## 2021-06-11 ENCOUNTER — Other Ambulatory Visit: Payer: Self-pay | Admitting: Cardiology

## 2021-06-11 DIAGNOSIS — Z87891 Personal history of nicotine dependence: Secondary | ICD-10-CM

## 2021-06-16 ENCOUNTER — Other Ambulatory Visit: Payer: Self-pay | Admitting: Cardiology

## 2021-06-16 DIAGNOSIS — I10 Essential (primary) hypertension: Secondary | ICD-10-CM

## 2021-07-17 DIAGNOSIS — Z20822 Contact with and (suspected) exposure to covid-19: Secondary | ICD-10-CM | POA: Diagnosis not present

## 2021-08-02 ENCOUNTER — Other Ambulatory Visit: Payer: Self-pay | Admitting: Cardiology

## 2021-08-02 DIAGNOSIS — E782 Mixed hyperlipidemia: Secondary | ICD-10-CM

## 2021-09-27 ENCOUNTER — Other Ambulatory Visit: Payer: Self-pay | Admitting: Cardiology

## 2021-09-27 DIAGNOSIS — I10 Essential (primary) hypertension: Secondary | ICD-10-CM

## 2021-09-28 MED ORDER — AMLODIPINE BESYLATE 5 MG PO TABS: 5.0000 mg | ORAL_TABLET | Freq: Every day | ORAL | 0 refills | Status: DC

## 2021-10-03 ENCOUNTER — Other Ambulatory Visit: Payer: Self-pay | Admitting: Cardiology

## 2021-10-03 DIAGNOSIS — I10 Essential (primary) hypertension: Secondary | ICD-10-CM

## 2021-11-02 ENCOUNTER — Other Ambulatory Visit: Payer: Self-pay | Admitting: Cardiology

## 2021-11-02 DIAGNOSIS — E782 Mixed hyperlipidemia: Secondary | ICD-10-CM

## 2021-12-15 ENCOUNTER — Other Ambulatory Visit: Payer: Medicare Other

## 2022-02-16 ENCOUNTER — Other Ambulatory Visit: Payer: Self-pay | Admitting: Cardiology

## 2022-02-16 DIAGNOSIS — E782 Mixed hyperlipidemia: Secondary | ICD-10-CM

## 2022-06-20 ENCOUNTER — Other Ambulatory Visit: Payer: Self-pay | Admitting: Cardiology

## 2022-06-20 DIAGNOSIS — E782 Mixed hyperlipidemia: Secondary | ICD-10-CM

## 2022-06-28 ENCOUNTER — Other Ambulatory Visit: Payer: Self-pay | Admitting: Family Medicine

## 2022-06-28 DIAGNOSIS — R609 Edema, unspecified: Secondary | ICD-10-CM | POA: Diagnosis not present

## 2022-06-28 DIAGNOSIS — N1832 Chronic kidney disease, stage 3b: Secondary | ICD-10-CM | POA: Diagnosis not present

## 2022-06-28 DIAGNOSIS — E039 Hypothyroidism, unspecified: Secondary | ICD-10-CM | POA: Diagnosis not present

## 2022-06-28 DIAGNOSIS — G2581 Restless legs syndrome: Secondary | ICD-10-CM | POA: Diagnosis not present

## 2022-06-28 DIAGNOSIS — E2839 Other primary ovarian failure: Secondary | ICD-10-CM

## 2022-06-28 DIAGNOSIS — E7800 Pure hypercholesterolemia, unspecified: Secondary | ICD-10-CM | POA: Diagnosis not present

## 2022-06-28 DIAGNOSIS — Z1331 Encounter for screening for depression: Secondary | ICD-10-CM | POA: Diagnosis not present

## 2022-06-28 DIAGNOSIS — I25119 Atherosclerotic heart disease of native coronary artery with unspecified angina pectoris: Secondary | ICD-10-CM | POA: Diagnosis not present

## 2022-06-28 DIAGNOSIS — Z0000 Encounter for general adult medical examination without abnormal findings: Secondary | ICD-10-CM | POA: Diagnosis not present

## 2022-06-28 DIAGNOSIS — I700 Atherosclerosis of aorta: Secondary | ICD-10-CM | POA: Diagnosis not present

## 2022-06-28 DIAGNOSIS — M25552 Pain in left hip: Secondary | ICD-10-CM | POA: Diagnosis not present

## 2022-06-28 DIAGNOSIS — I10 Essential (primary) hypertension: Secondary | ICD-10-CM | POA: Diagnosis not present

## 2022-07-06 DIAGNOSIS — Z1211 Encounter for screening for malignant neoplasm of colon: Secondary | ICD-10-CM | POA: Diagnosis not present

## 2022-07-16 DIAGNOSIS — Z780 Asymptomatic menopausal state: Secondary | ICD-10-CM | POA: Diagnosis not present

## 2022-08-14 ENCOUNTER — Emergency Department (HOSPITAL_COMMUNITY): Payer: Medicare Other

## 2022-08-14 ENCOUNTER — Observation Stay (HOSPITAL_COMMUNITY)
Admission: EM | Admit: 2022-08-14 | Discharge: 2022-08-15 | Disposition: A | Discharge status: Home / Self Care | Payer: Medicare Other | Attending: Orthopedic Surgery | Admitting: Orthopedic Surgery

## 2022-08-14 ENCOUNTER — Emergency Department (HOSPITAL_COMMUNITY): Payer: Medicare Other | Admitting: Certified Registered Nurse Anesthetist

## 2022-08-14 ENCOUNTER — Encounter (HOSPITAL_COMMUNITY)
Admission: EM | Disposition: A | Discharge status: Home / Self Care | Payer: Self-pay | Source: Home / Self Care | Attending: Emergency Medicine

## 2022-08-14 ENCOUNTER — Encounter (HOSPITAL_COMMUNITY): Payer: Self-pay

## 2022-08-14 ENCOUNTER — Emergency Department (HOSPITAL_BASED_OUTPATIENT_CLINIC_OR_DEPARTMENT_OTHER): Payer: Medicare Other | Admitting: Certified Registered Nurse Anesthetist

## 2022-08-14 DIAGNOSIS — S73032A Other anterior subluxation of left hip, initial encounter: Principal | ICD-10-CM | POA: Insufficient documentation

## 2022-08-14 DIAGNOSIS — Z7982 Long term (current) use of aspirin: Secondary | ICD-10-CM | POA: Diagnosis not present

## 2022-08-14 DIAGNOSIS — F17200 Nicotine dependence, unspecified, uncomplicated: Secondary | ICD-10-CM | POA: Diagnosis not present

## 2022-08-14 DIAGNOSIS — Z87891 Personal history of nicotine dependence: Secondary | ICD-10-CM | POA: Diagnosis not present

## 2022-08-14 DIAGNOSIS — S73005A Unspecified dislocation of left hip, initial encounter: Secondary | ICD-10-CM | POA: Diagnosis not present

## 2022-08-14 DIAGNOSIS — X509XXA Other and unspecified overexertion or strenuous movements or postures, initial encounter: Secondary | ICD-10-CM | POA: Diagnosis not present

## 2022-08-14 DIAGNOSIS — Z96642 Presence of left artificial hip joint: Secondary | ICD-10-CM | POA: Diagnosis not present

## 2022-08-14 DIAGNOSIS — E039 Hypothyroidism, unspecified: Secondary | ICD-10-CM

## 2022-08-14 DIAGNOSIS — I10 Essential (primary) hypertension: Secondary | ICD-10-CM | POA: Diagnosis not present

## 2022-08-14 DIAGNOSIS — Z471 Aftercare following joint replacement surgery: Secondary | ICD-10-CM | POA: Diagnosis not present

## 2022-08-14 DIAGNOSIS — Z79899 Other long term (current) drug therapy: Secondary | ICD-10-CM | POA: Insufficient documentation

## 2022-08-14 DIAGNOSIS — M25552 Pain in left hip: Secondary | ICD-10-CM | POA: Diagnosis not present

## 2022-08-14 DIAGNOSIS — I959 Hypotension, unspecified: Secondary | ICD-10-CM | POA: Diagnosis not present

## 2022-08-14 DIAGNOSIS — T84021A Dislocation of internal left hip prosthesis, initial encounter: Secondary | ICD-10-CM | POA: Diagnosis not present

## 2022-08-14 DIAGNOSIS — S73035A Other anterior dislocation of left hip, initial encounter: Secondary | ICD-10-CM | POA: Diagnosis present

## 2022-08-14 HISTORY — PX: HIP CLOSED REDUCTION: SHX983

## 2022-08-14 LAB — CBC
HCT: 36.2 % (ref 36.0–46.0)
Hemoglobin: 12.2 g/dL (ref 12.0–15.0)
MCH: 29.3 pg (ref 26.0–34.0)
MCHC: 33.7 g/dL (ref 30.0–36.0)
MCV: 87 fL (ref 80.0–100.0)
Platelets: 215 10*3/uL (ref 150–400)
RBC: 4.16 MIL/uL (ref 3.87–5.11)
RDW: 13.4 % (ref 11.5–15.5)
WBC: 10.8 10*3/uL — ABNORMAL HIGH (ref 4.0–10.5)
nRBC: 0 % (ref 0.0–0.2)

## 2022-08-14 SURGERY — CLOSED MANIPULATION, JOINT, HIP
Anesthesia: General | Site: Hip | Laterality: Left

## 2022-08-14 MED ORDER — PROPOFOL 10 MG/ML IV BOLUS: 80.0000 mg | Freq: Once | INTRAVENOUS | Status: DC

## 2022-08-14 MED ORDER — KETAMINE HCL 50 MG/5ML IJ SOSY
80.0000 mg | PREFILLED_SYRINGE | Freq: Once | INTRAMUSCULAR | Status: DC
  Filled 2022-08-14: qty 10

## 2022-08-14 MED ORDER — ONDANSETRON HCL 4 MG/2ML IJ SOLN
4.0000 mg | Freq: Once | INTRAMUSCULAR | Status: AC
  Administered 2022-08-14: 4 mg via INTRAVENOUS
  Filled 2022-08-14: qty 2

## 2022-08-14 MED ORDER — MORPHINE SULFATE (PF) 4 MG/ML IV SOLN
4.0000 mg | Freq: Once | INTRAVENOUS | Status: AC
  Administered 2022-08-14: 4 mg via INTRAVENOUS
  Filled 2022-08-14: qty 1

## 2022-08-14 MED ORDER — PROPOFOL 10 MG/ML IV BOLUS
INTRAVENOUS | Status: AC
  Filled 2022-08-14: qty 20

## 2022-08-14 MED ORDER — LACTATED RINGERS IV BOLUS
1000.0000 mL | Freq: Once | INTRAVENOUS | Status: AC
  Administered 2022-08-14: 1000 mL via INTRAVENOUS

## 2022-08-14 SURGICAL SUPPLY — 16 items
BAG COUNTER SPONGE SURGICOUNT (BAG) IMPLANT
BNDG ADH 1X3 SHEER STRL LF (GAUZE/BANDAGES/DRESSINGS) IMPLANT
COVER SURGICAL LIGHT HANDLE (MISCELLANEOUS) ×1 IMPLANT
GAUZE SPONGE 4X4 12PLY STRL (GAUZE/BANDAGES/DRESSINGS) IMPLANT
GLOVE BIO SURGEON STRL SZ7 (GLOVE) ×1 IMPLANT
GOWN STRL REUS W/ TWL XL LVL3 (GOWN DISPOSABLE) ×1 IMPLANT
GOWN STRL REUS W/TWL XL LVL3 (GOWN DISPOSABLE)
IMMOBILIZER KNEE 20 (SOFTGOODS) ×1 IMPLANT
IMMOBILIZER KNEE 20 THIGH 36 (SOFTGOODS) IMPLANT
MANIFOLD NEPTUNE II (INSTRUMENTS) ×1 IMPLANT
NDL SAFETY ECLIP 18X1.5 (MISCELLANEOUS) IMPLANT
PENCIL SMOKE EVACUATOR (MISCELLANEOUS) IMPLANT
PROTECTOR NERVE ULNAR (MISCELLANEOUS) ×1 IMPLANT
SUT BONE WAX W31G (SUTURE) ×1 IMPLANT
SYR CONTROL 10ML LL (SYRINGE) IMPLANT
TOWEL OR 17X26 10 PK STRL BLUE (TOWEL DISPOSABLE) ×2 IMPLANT

## 2022-08-14 NOTE — ED Provider Notes (Signed)
[place] EMERGENCY DEPARTMENT AT [facility] Provider Note   CSN: [id number] Arrival date & time: 08/14/22  2047     History  No chief complaint on file.   Kendra Mcgrath is a 72 y.o. female. 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  Patient was out feeding her guineas when she bent over and heard a loud pop in her left hip.  She did not fall or hit her head.  She has no numbness tingling loss sensation in her leg.  This has happened before when she has previously dislocated her hip.  She has seen Dr. Constance Goltz in the past and said the next time she had her hip dislocated she needed to get surgery to pin it in place.  Last ate and drank at noon today.  HPI     Home Medications Prior to Admission medications   Medication Sig Start Date End Date Taking? Authorizing Provider  amLODipine (NORVASC) 5 MG tablet Take 1 tablet (5 mg total) by mouth daily. 09/28/21   Yates Decamp, MD  aspirin EC 81 MG tablet Take 1 tablet (81 mg total) by mouth daily. Patient taking differently: Take 81 mg by mouth as needed. 02/12/20   Yates Decamp, MD  atorvastatin (LIPITOR) 40 MG tablet *NEED OFFICE VISIT* TAKE 1 TABLET BY MOUTH EVERY DAY 06/21/22   Yates Decamp, MD  benazepril-hydrochlorthiazide (LOTENSIN HCT) 20-25 MG tablet TAKE 1 TABLET BY MOUTH EVERY DAY IN THE MORNING 10/05/21   Yates Decamp, MD  buPROPion [facility] SR) 150 MG 12 hr tablet TAKE 1 TABLET BY MOUTH EVERY DAY 06/11/21   Yates Decamp, MD  diphenhydrAMINE (BENADRYL) 25 MG tablet Take 75 mg by mouth at bedtime.     [provider]  levothyroxine (SYNTHROID, LEVOTHROID) 25 MCG tablet Take 25 mcg by mouth daily. 12/20/17   [provider]  naproxen sodium (ALEVE) 220 MG tablet Take 220 mg by mouth daily as needed (headaches/pain).    [provider]   nitroGLYCERIN (NITROSTAT) 0.4 MG SL tablet Place 1 tablet (0.4 mg total) under the tongue every 5 (five) minutes as needed for chest pain. 02/16/16   Yates Decamp, MD      Allergies    Penicillins and Tetanus toxoid, adsorbed    Review of Systems   Review of Systems  Physical Exam Updated Vital Signs BP 138/63 (BP Location: Right Arm)   Pulse 80   Temp 97.7 F (36.5 C) (Oral)   Resp 20   Ht  (1.651 m)   Wt 81.6 kg   SpO2 99%   BMI 29.95 kg/m  Physical Exam Constitutional: Alert and oriented.  Uncomfortable laying on left side in bed but no acute distress Eyes: Conjunctivae are normal. ENT      Head: Normocephalic and atraumatic. Cardiovascular: S1, S2, regular rate and rhythm, palpable distal DP pulses, extremities warm well-perfused Respiratory: Normal respiratory effort.  O2 sat 99 on RA Gastrointestinal: Soft Musculoskeletal:       Right lower leg: No tenderness or edema.      Left lower leg: Tenderness of the left hip.  Shortened hip internally rotated and adducted.  Distal sensation intact.  Able to wiggle toes distally.  Warm well-perfused distally with palpable DP and PT pulses. Neurologic: Normal speech and language. No gross focal neurologic deficits are appreciated.  Skin: Skin is warm, dry and intact. No rash noted. Psychiatric: Mood and affect are normal. Speech and behavior are normal.  ED Results / Procedures / Treatments   Labs (all labs ordered are listed, but only abnormal results are displayed) Labs Reviewed  CBC - Abnormal; Notable for the following components:      Result Value   WBC 10.8 (*)    All other components within normal limits  BASIC METABOLIC PANEL    EKG None  Radiology DG Hip Unilat W or Wo Pelvis 2-3 Views Left  Result Date: 08/14/2022 CLINICAL DATA:  Possible dislocation.  Pain EXAM: DG HIP (WITH OR WITHOUT PELVIS) 2V LEFT COMPARISON:  X-ray 01/13/2018 and older FINDINGS: Limited x-rays due to patient positioning. Left hip  arthroplasty identified with a screw fixated acetabular cup and Press-Fit femoral component. The femoral component is dislocated from the cup posterior and superior. No separate underlying fracture. IMPRESSION: Dislocated left hip arthroplasty. Limited x-rays Electronically Signed   By: Karen Kays M.D.   On: 08/14/2022 21:27    Procedures Procedures    Medications Ordered in ED Medications  lactated ringers bolus 1,000 mL (0 mLs Intravenous Stopped 08/14/22 2322)  morphine (PF) 4 MG/ML injection 4 mg (4 mg Intravenous Given 08/14/22 2203)  ondansetron (ZOFRAN) injection 4 mg (4 mg Intravenous Given 08/14/22 2203)  morphine (PF) 4 MG/ML injection 4 mg (4 mg Intravenous Given 08/14/22 2327)    ED Course/ Medical Decision Making/ A&P Clinical Course as of 08/14/22 2345  Sat Aug 14, 2022  2207 Called on-call orthopedist for Dr. Constance Goltz since patient has had recurrent hip dislocations and was told prior that if she had another she needed surgery to stabilize hip. [VB]  2249 Spoke with Dr. Shon Baton on-call orthopedist he will call OR to arrange for reduction in OR. [VB]  2344 Patient went to the OR in stable condition.  Pending BMP.  Will be discharged from the OR. [VB]    Clinical Course User Index [VB] Mardene Sayer, MD                             Medical Decision Making Rosene Pilling is a 72 y.o. female. 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    Patient had atraumatic hip dislocation today confirmed on x-ray which I personally reviewed.  She is neurovascularly intact.  There is no associated fracture.  She has had no associated other injuries.  N.p.o. since noon today.  Spoke with on-call orthopedic surgeon for Dr. [name] since patient has had recurrent hip dislocations and was told prior that if she had another she  needed surgery to stabilize hip. [VB]  Basic labs obtained with mild leukocytosis 10.8 likely stress reaction from pain.  Normal hemoglobin 12.2.  Spoke with Dr. Shon Baton on-call orthopedist he will call OR to arrange for reduction in OR. [VB] Patient to remain npo.    Amount and/or Complexity of Data Reviewed Labs: ordered. Radiology: ordered.  Risk Prescription drug management.      Final Clinical Impression(s) / ED Diagnoses Final diagnoses:  Dislocation of left hip, initial encounter    Rx / DC Orders ED Discharge Orders     None         Mardene Sayer, MD 08/14/22 2345

## 2022-08-14 NOTE — Anesthesia Preprocedure Evaluation (Addendum)
Anesthesia Evaluation  Patient identified by MRN, date of birth, ID band Patient awake    Reviewed: Allergy & Precautions, NPO status , Patient's Chart, lab work & pertinent test results  History of Anesthesia Complications Negative for: history of anesthetic complications  Airway Mallampati: II  TM Distance: >3 FB Neck ROM: Full    Dental  (+) Edentulous Upper, Edentulous Lower   Pulmonary Current SmokerPatient did not abstain from smoking.   Pulmonary exam normal        Cardiovascular hypertension, Pt. on medications Normal cardiovascular exam     Neuro/Psych  Headaches  negative psych ROS   GI/Hepatic negative GI ROS, Neg liver ROS,,,  Endo/Other  Hypothyroidism   Obesity   Renal/GU negative Renal ROS     Musculoskeletal  (+) Arthritis , Osteoarthritis,    Abdominal   Peds  Hematology negative hematology ROS (+)   Anesthesia Other Findings   Reproductive/Obstetrics                             Anesthesia Physical Anesthesia Plan  ASA: 3 and emergent  Anesthesia Plan: General   Post-op Pain Management: Minimal or no pain anticipated   Induction: Intravenous  PONV Risk Score and Plan: 3 and Treatment may vary due to age or medical condition and Ondansetron  Airway Management Planned: LMA  Additional Equipment: None  Intra-op Plan:   Post-operative Plan:   Informed Consent: I have reviewed the patients History and Physical, chart, labs and discussed the procedure including the risks, benefits and alternatives for the proposed anesthesia with the patient or authorized representative who has indicated his/her understanding and acceptance.       Plan Discussed with: CRNA, Anesthesiologist and Surgeon  Anesthesia Plan Comments:         Anesthesia Quick Evaluation

## 2022-08-14 NOTE — ED Triage Notes (Signed)
Arrived via EMS from home for left hip pain possible hip dislocated, pt was bending over and heard a pop. Pt has dislocated hip before. fentanyl given with EMS.

## 2022-08-14 NOTE — H&P (Signed)
Chief Complaint:  L hip dislocation  History:  Kendra Mcgrath is a 72 y.o. 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  She bent over and heard a loud pop in her left hip.  She did not fall or hit her head.  She has no numbness tingling loss sensation in her leg.  This has happened before when she has previously dislocated her hip.  Last ate and drank at noon today.   ROS: no LOC, blurry vision, or confusion.  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    Tetanus Toxoid, Adsorbed Hives, Itching and Rash    No current facility-administered medications on file prior to encounter.   Current Outpatient Medications on File Prior to Encounter  Medication Sig Dispense Refill   amLODipine (NORVASC) 5 MG tablet Take 1 tablet (5 mg total) by mouth daily. 90 tablet 0    aspirin EC 81 MG tablet Take 1 tablet (81 mg total) by mouth daily. (Patient taking differently: Take 81 mg by mouth as needed.) 90 tablet 3   atorvastatin (LIPITOR) 40 MG tablet *NEED OFFICE VISIT* TAKE 1 TABLET BY MOUTH EVERY DAY 90 tablet 0   benazepril-hydrochlorthiazide (LOTENSIN HCT) 20-25 MG tablet TAKE 1 TABLET BY MOUTH EVERY DAY IN THE MORNING 90 tablet 1   buPROPion (WELLBUTRIN SR) 150 MG 12 hr tablet TAKE 1 TABLET BY MOUTH EVERY DAY 90 tablet 3   diphenhydrAMINE (BENADRYL) 25 MG tablet Take 75 mg by mouth at bedtime.      levothyroxine (SYNTHROID, LEVOTHROID) 25 MCG tablet Take 25 mcg by mouth daily.  5   naproxen sodium (ALEVE) 220 MG tablet Take 220 mg by mouth daily as needed (headaches/pain).     nitroGLYCERIN (NITROSTAT) 0.4 MG SL tablet Place 1 tablet (0.4 mg total) under the tongue every 5 (five) minutes as needed for chest pain. 25 tablet 2    Physical Exam: Vitals:   08/14/22 2153  BP: 138/63  Pulse: 80  Resp: 20  Temp: 97.7 F (36.5 C)  SpO2: 99%   Body mass index is 29.95 kg/m. A+O x3 No sob/cp Abd: soft/NT.  No rebound tenderness Left hip pain and deformity.  Unable to move the hip due to pain.  No  laceration or abrasion Compartments soft/NT.  2+ DP/PT pulses 5/5 EHL/TA and intact sensation to light touch No knee/ankle pain with palpation or ROM.   Image: DG Hip Unilat W or Wo Pelvis 2-3 Views Left  Result Date: 08/14/2022 CLINICAL DATA:  Possible dislocation.  Pain EXAM: DG HIP (WITH OR WITHOUT PELVIS) 2V LEFT COMPARISON:  X-ray 01/13/2018 and older FINDINGS: Limited x-rays due to patient positioning. Left hip arthroplasty identified with a screw fixated acetabular cup and Press-Fit femoral component. The femoral component is dislocated from the cup posterior and superior. No separate underlying fracture. IMPRESSION: Dislocated left hip arthroplasty. Limited x-rays Electronically Signed   By: Karen Kays M.D.   On: 08/14/2022 21:27    A/P: This is a 72 yr  old woman with prior hip dislocations who presents with left hip dislocation after postural indiscretion.  Discussed risks/benefits with patient include infection, fracture, inability to reduce the hip. Consent obtained and all questions addressed.

## 2022-08-14 NOTE — Discharge Instructions (Addendum)
Weightbear as tolerated Remain in knee immobilizer at all times Call Dr. Nilsa Nutting office to arrange follow up

## 2022-08-15 ENCOUNTER — Emergency Department (HOSPITAL_COMMUNITY): Payer: Medicare Other

## 2022-08-15 ENCOUNTER — Encounter (HOSPITAL_COMMUNITY): Payer: Self-pay | Admitting: Orthopedic Surgery

## 2022-08-15 ENCOUNTER — Other Ambulatory Visit: Payer: Self-pay

## 2022-08-15 DIAGNOSIS — S73005A Unspecified dislocation of left hip, initial encounter: Secondary | ICD-10-CM | POA: Diagnosis not present

## 2022-08-15 DIAGNOSIS — Z96642 Presence of left artificial hip joint: Secondary | ICD-10-CM | POA: Diagnosis not present

## 2022-08-15 DIAGNOSIS — S73035A Other anterior dislocation of left hip, initial encounter: Secondary | ICD-10-CM | POA: Diagnosis present

## 2022-08-15 DIAGNOSIS — S73032A Other anterior subluxation of left hip, initial encounter: Secondary | ICD-10-CM | POA: Diagnosis not present

## 2022-08-15 DIAGNOSIS — T84021A Dislocation of internal left hip prosthesis, initial encounter: Secondary | ICD-10-CM | POA: Diagnosis not present

## 2022-08-15 LAB — BASIC METABOLIC PANEL
Anion gap: 10 (ref 5–15)
BUN: 25 mg/dL — ABNORMAL HIGH (ref 8–23)
CO2: 25 mmol/L (ref 22–32)
Calcium: 9.1 mg/dL (ref 8.9–10.3)
Chloride: 100 mmol/L (ref 98–111)
Creatinine, Ser: 1.17 mg/dL — ABNORMAL HIGH (ref 0.44–1.00)
GFR, Estimated: 49 mL/min — ABNORMAL LOW (ref >60)
Glucose, Bld: 97 mg/dL (ref 70–99)
Potassium: 4 mmol/L (ref 3.5–5.1)
Sodium: 135 mmol/L (ref 135–145)

## 2022-08-15 LAB — GLUCOSE, CAPILLARY: Glucose-Capillary: 105 mg/dL — ABNORMAL HIGH (ref 70–99)

## 2022-08-15 MED ORDER — DOCUSATE SODIUM 100 MG PO CAPS: 100.0000 mg | ORAL_CAPSULE | Freq: Two times a day (BID) | ORAL | 0 refills | Status: DC | PRN

## 2022-08-15 MED ORDER — HYDROCHLOROTHIAZIDE 25 MG PO TABS
25.0000 mg | ORAL_TABLET | Freq: Every day | ORAL | Status: DC
  Administered 2022-08-15: 25 mg via ORAL
  Filled 2022-08-15: qty 1

## 2022-08-15 MED ORDER — FENTANYL CITRATE PF 50 MCG/ML IJ SOSY
PREFILLED_SYRINGE | INTRAMUSCULAR | Status: AC
  Filled 2022-08-15: qty 1

## 2022-08-15 MED ORDER — BENAZEPRIL-HYDROCHLOROTHIAZIDE 20-25 MG PO TABS: 1.0000 | ORAL_TABLET | Freq: Every day | ORAL | Status: DC

## 2022-08-15 MED ORDER — NAPROXEN 250 MG PO TABS
250.0000 mg | ORAL_TABLET | Freq: Two times a day (BID) | ORAL | Status: DC
  Administered 2022-08-15: 250 mg via ORAL
  Filled 2022-08-15: qty 1

## 2022-08-15 MED ORDER — ATORVASTATIN CALCIUM 40 MG PO TABS
40.0000 mg | ORAL_TABLET | Freq: Every day | ORAL | Status: DC
  Administered 2022-08-15: 40 mg via ORAL
  Filled 2022-08-15: qty 1

## 2022-08-15 MED ORDER — METHOCARBAMOL 500 MG IVPB - SIMPLE MED
INTRAVENOUS | Status: AC
  Filled 2022-08-15: qty 55

## 2022-08-15 MED ORDER — SUCCINYLCHOLINE CHLORIDE 200 MG/10ML IV SOSY
PREFILLED_SYRINGE | INTRAVENOUS | Status: DC | PRN
  Administered 2022-08-15: 140 mg via INTRAVENOUS

## 2022-08-15 MED ORDER — LACTATED RINGERS IV SOLN: INTRAVENOUS | Status: DC | PRN

## 2022-08-15 MED ORDER — DOCUSATE SODIUM 100 MG PO CAPS
100.0000 mg | ORAL_CAPSULE | Freq: Two times a day (BID) | ORAL | Status: DC
  Filled 2022-08-15 (×2): qty 1

## 2022-08-15 MED ORDER — LIDOCAINE HCL (PF) 2 % IJ SOLN
INTRAMUSCULAR | Status: DC | PRN
  Administered 2022-08-15: 60 mg via INTRADERMAL

## 2022-08-15 MED ORDER — METHOCARBAMOL 500 MG PO TABS: 500.0000 mg | ORAL_TABLET | Freq: Four times a day (QID) | ORAL | Status: DC | PRN

## 2022-08-15 MED ORDER — ONDANSETRON HCL 4 MG/2ML IJ SOLN
INTRAMUSCULAR | Status: DC | PRN
  Administered 2022-08-15: 4 mg via INTRAVENOUS

## 2022-08-15 MED ORDER — HYDROCODONE-ACETAMINOPHEN 5-325 MG PO TABS: 1.0000 | ORAL_TABLET | Freq: Four times a day (QID) | ORAL | 0 refills | Status: DC | PRN

## 2022-08-15 MED ORDER — HYDROCODONE-ACETAMINOPHEN 7.5-325 MG PO TABS: 1.0000 | ORAL_TABLET | ORAL | Status: DC | PRN

## 2022-08-15 MED ORDER — ONDANSETRON HCL 4 MG PO TABS: 4.0000 mg | ORAL_TABLET | Freq: Four times a day (QID) | ORAL | Status: DC | PRN

## 2022-08-15 MED ORDER — METHOCARBAMOL 500 MG IVPB - SIMPLE MED
500.0000 mg | Freq: Four times a day (QID) | INTRAVENOUS | Status: DC | PRN
  Administered 2022-08-15: 500 mg via INTRAVENOUS

## 2022-08-15 MED ORDER — PROPOFOL 10 MG/ML IV BOLUS
INTRAVENOUS | Status: DC | PRN
  Administered 2022-08-15: 130 mg via INTRAVENOUS

## 2022-08-15 MED ORDER — AMLODIPINE BESYLATE 5 MG PO TABS
5.0000 mg | ORAL_TABLET | Freq: Every day | ORAL | Status: DC
  Administered 2022-08-15: 5 mg via ORAL
  Filled 2022-08-15: qty 1

## 2022-08-15 MED ORDER — BENAZEPRIL HCL 20 MG PO TABS
20.0000 mg | ORAL_TABLET | Freq: Every day | ORAL | Status: DC
  Administered 2022-08-15: 20 mg via ORAL
  Filled 2022-08-15: qty 1

## 2022-08-15 MED ORDER — HYDROCODONE-ACETAMINOPHEN 5-325 MG PO TABS
1.0000 | ORAL_TABLET | ORAL | Status: DC | PRN
  Administered 2022-08-15 (×2): 2 via ORAL
  Filled 2022-08-15 (×2): qty 2

## 2022-08-15 MED ORDER — BUPROPION HCL ER (SR) 150 MG PO TB12
150.0000 mg | ORAL_TABLET | Freq: Every day | ORAL | Status: DC
  Filled 2022-08-15: qty 1

## 2022-08-15 MED ORDER — OXYCODONE HCL 5 MG/5ML PO SOLN: 5.0000 mg | Freq: Once | ORAL | Status: DC | PRN

## 2022-08-15 MED ORDER — ONDANSETRON HCL 4 MG/2ML IJ SOLN: 4.0000 mg | Freq: Once | INTRAMUSCULAR | Status: DC | PRN

## 2022-08-15 MED ORDER — ACETAMINOPHEN 325 MG PO TABS: 325.0000 mg | ORAL_TABLET | Freq: Four times a day (QID) | ORAL | Status: DC | PRN

## 2022-08-15 MED ORDER — DIPHENHYDRAMINE HCL 25 MG PO CAPS: 75.0000 mg | ORAL_CAPSULE | Freq: Every day | ORAL | Status: DC

## 2022-08-15 MED ORDER — OXYCODONE HCL 5 MG PO TABS: 5.0000 mg | ORAL_TABLET | Freq: Once | ORAL | Status: DC | PRN

## 2022-08-15 MED ORDER — LEVOTHYROXINE SODIUM 25 MCG PO TABS
25.0000 ug | ORAL_TABLET | Freq: Every day | ORAL | Status: DC
  Administered 2022-08-15: 25 ug via ORAL
  Filled 2022-08-15: qty 1

## 2022-08-15 MED ORDER — ONDANSETRON HCL 4 MG/2ML IJ SOLN: 4.0000 mg | Freq: Four times a day (QID) | INTRAMUSCULAR | Status: DC | PRN

## 2022-08-15 MED ORDER — FENTANYL CITRATE PF 50 MCG/ML IJ SOSY
25.0000 ug | PREFILLED_SYRINGE | INTRAMUSCULAR | Status: DC | PRN
  Administered 2022-08-15: 50 ug via INTRAVENOUS

## 2022-08-15 MED ORDER — MORPHINE SULFATE (PF) 2 MG/ML IV SOLN: 0.5000 mg | INTRAVENOUS | Status: DC | PRN

## 2022-08-15 NOTE — Op Note (Signed)
OPERATIVE REPORT  DATE OF SURGERY: 08/15/2022  PATIENT NAME:  Kendra Mcgrath MRN: [id number] DOB: [birth year]  PCP: Tally Joe, MD  PRE-OPERATIVE DIAGNOSIS: Left hip arthroplasty dislocation  POST-OPERATIVE DIAGNOSIS: Same  PROCEDURE:   Closed reduction of left hip dislocation  SURGEON:  Venita Lick, MD  PHYSICIAN ASSISTANT: None  ANESTHESIA:   General  EBL: None   Complications: None  BRIEF HISTORY: [name] is a 72 y.o. female who had a postural indiscretion and heard a pop in her left hip.  Patient has had multiple prior hip dislocations and presented to the emergency room unable to ambulate with a grossly deformed left hip.  Imaging confirmed a hip dislocation and the decision was made to take her to the operating room for close reduction under general anesthesia.  All appropriate risks, benefits, and alternatives were discussed with the patient and consent was obtained  PROCEDURE DETAILS: Patient was brought into the operating room and was properly positioned on the operating room table.  After induction with general anesthesia the patient was endotracheally intubated.  A timeout was taken to confirm all important data: including patient, procedure, and the affected extremity.  With gentle manipulation and countertraction the hip was reduced with an audible pop.  The extremity was now out to length and properly aligned.  Patient had 2+ dorsalis pedis/posterior tibialis pulses.  A knee immobilizer was then applied and the patient was extubated and transferred to the PACU without incident.  There were no complications.  Venita Lick, MD 08/15/2022 12:14 AM

## 2022-08-15 NOTE — Transfer of Care (Signed)
Immediate Anesthesia Transfer of Care Note  Patient: Shaquia Berkley  Procedure(s) Performed: CLOSED MANIPULATION HIP (Left: Hip)  Patient Location: PACU  Anesthesia Type:General  Level of Consciousness: awake, alert , and oriented  Airway & Oxygen Therapy: Patient Spontanous Breathing and Patient connected to face mask oxygen  Post-op Assessment: Report given to RN and Post -op Vital signs reviewed and stable  Post vital signs: Reviewed and stable  Last Vitals:  Vitals Value Taken Time  BP    Temp    Pulse    Resp    SpO2      Last Pain:  Vitals:   08/14/22 2321  TempSrc:   PainSc: 8          Complications: No notable events documented.

## 2022-08-15 NOTE — Evaluation (Signed)
Physical Therapy Evaluation Patient Details Name: Kendra Mcgrath MRN: [id number] DOB: 07-13-1950 Today's Date: 08/15/2022  History of Present Illness  Pt s/p reduction of L THR dislocation.  Pt with hx of L THR with multiple dislocation and lumbar lami  Clinical Impression  Pt admitted as above and presenting with functional mobility limitations 2* posterior THP and KI in place.  Pt demonstrating MOD I performance of basic mobility tasks including ambulating in hall and up to bathroom for toileting but with occasional reminders for adherence to THP.  THP reviewed and written instruction provided.  Pt EAGER for dc home this date.     Recommendations for follow up therapy are one component of a multi-disciplinary discharge planning process, led by the attending physician.  Recommendations may be updated based on patient status, additional functional criteria and insurance authorization.  Follow Up Recommendations       Assistance Recommended at Discharge PRN  Patient can return home with the following  Assistance with cooking/housework;Assist for transportation    Equipment Recommendations None recommended by PT  Recommendations for Other Services       Functional Status Assessment       Precautions / Restrictions Precautions Precautions: Posterior Hip Precaution Booklet Issued: Yes (comment) Required Braces or Orthoses: Knee Immobilizer - Left Knee Immobilizer - Left: On at all times Restrictions Weight Bearing Restrictions: No Other Position/Activity Restrictions: WBAT      Mobility  Bed Mobility Overal bed mobility: Modified Independent             General bed mobility comments: min cues for adherence to THP    Transfers Overall transfer level: Needs assistance Equipment used: Rolling walker (2 wheels) Transfers: Sit to/from Stand Sit to Stand: Supervision, Modified independent (Device/Increase time)           General transfer comment: cues for LE  management    Ambulation/Gait Ambulation/Gait assistance: Supervision, Modified independent (Device/Increase time) Gait Distance (Feet): 100 Feet Assistive device: Rolling walker (2 wheels) Gait Pattern/deviations: Step-through pattern, Decreased step length - right, Decreased step length - left, Shuffle       General Gait Details: cues for posture and position from RW.  Stairs            Wheelchair Mobility    Modified Rankin (Stroke Patients Only)       Balance Overall balance assessment: Mild deficits observed, not formally tested                                           Pertinent Vitals/Pain Pain Assessment Pain Assessment: 0-10 Pain Score: 1     Home Living Family/patient expects to be discharged to:: Private residence Living Arrangements: Spouse/significant other Available Help at Discharge: Family Type of Home: House Home Access: Stairs to enter   Secretary/administrator of Steps: 1   Home Layout: One level Home Equipment: Agricultural consultant (2 wheels);Cane - single point      Prior Function Prior Level of Function : Independent/Modified Independent                     Hand Dominance        Extremity/Trunk Assessment   Upper Extremity Assessment Upper Extremity Assessment: Overall WFL for tasks assessed    Lower Extremity Assessment Lower Extremity Assessment: LLE deficits/detail LLE Deficits / Details: KI in place       Communication  Communication: No difficulties  Cognition Arousal/Alertness: Awake/alert Behavior During Therapy: WFL for tasks assessed/performed Overall Cognitive Status: Within Functional Limits for tasks assessed                                          General Comments      Exercises     Assessment/Plan    PT Assessment    PT Problem List         PT Treatment Interventions      PT Goals (Current goals can be found in the Care Plan section)  Acute Rehab PT  Goals Patient Stated Goal: HOME!!! PT Goal Formulation: All assessment and education complete, DC therapy    Frequency Min 1X/week     Co-evaluation               AM-PAC PT "6 Clicks" Mobility  Outcome Measure Help needed turning from your back to your side while in a flat bed without using bedrails?: None Help needed moving from lying on your back to sitting on the side of a flat bed without using bedrails?: None Help needed moving to and from a bed to a chair (including a wheelchair)?: None Help needed standing up from a chair using your arms (e.g., wheelchair or bedside chair)?: None Help needed to walk in hospital room?: None Help needed climbing 3-5 steps with a railing? : A Little 6 Click Score: 23    End of Session Equipment Utilized During Treatment: Gait belt Activity Tolerance: Patient tolerated treatment well Patient left: in chair;with call bell/phone within reach;with family/visitor present Nurse Communication: Mobility status PT Visit Diagnosis: Difficulty in walking, not elsewhere classified (R26.2)    Time: 5621-3086 PT Time Calculation (min) (ACUTE ONLY): 22 min   Charges:   PT Evaluation $PT Eval Low Complexity: 1 Low          Mauro Kaufmann PT Acute Rehabilitation Services Pager [phone] Office [phone]   Symphany Fleissner 08/15/2022, 9:44 AM

## 2022-08-15 NOTE — Progress Notes (Signed)
Subjective: 1 Day Post-Op Procedure(s) (LRB): CLOSED MANIPULATION HIP (Left) Patient reports pain as mild.  Has not dislocated in several years but this is not her first dislocation episode. Feels ready to go home.  Objective: Vital signs in last 24 hours: Temp:  [97.6 F (36.4 C)-98.2 F (36.8 C)] 97.6 F (36.4 C) (04/21 0517) Pulse Rate:  [61-100] 61 (04/21 0517) Resp:  [11-28] 17 (04/21 0517) BP: (110-150)/(60-91) 110/60 (04/21 0517) SpO2:  [94 %-100 %] 94 % (04/21 0517) Weight:  [81.6 kg] 81.6 kg (04/20 2153)  Intake/Output from previous day: 04/20 0701 - 04/21 0700 In: 1010 [P.O.:360; IV Piggyback:650] Out: -  Intake/Output this shift: No intake/output data recorded.  Recent Labs    08/14/22 2140  HGB 12.2   Recent Labs    08/14/22 2140  WBC 10.8*  RBC 4.16  HCT 36.2  PLT 215   Recent Labs    08/14/22 2140  NA 135  K 4.0  CL 100  CO2 25  BUN 25*  CREATININE 1.17*  GLUCOSE 97  CALCIUM 9.1   No results for input(s): "LABPT", "INR" in the last 72 hours.  Neurologically intact ABD soft Neurovascular intact Sensation intact distally Intact pulses distally Dorsiflexion/Plantar flexion intact Incision: dressing C/D/I and no drainage No cellulitis present Compartment soft No sign of DVT   Assessment/Plan: 1 Day Post-Op Procedure(s) (LRB): CLOSED MANIPULATION HIP (Left) Advance diet Up with therapy (hip precautions) D/C to home today Maintain knee immobilizer, WBAT Discussed risk of recurrent dislocation Will need to follow up with Dr. [name] as an outpt to discuss revision  Kendra Mcgrath 08/15/2022, 9:02 AM

## 2022-08-15 NOTE — Plan of Care (Signed)
  Problem: Education: Goal: Knowledge of General Education information will improve Description: Including pain rating scale, medication(s)/side effects and non-pharmacologic comfort measures Outcome: Progressing   Problem: Activity: Goal: Risk for activity intolerance will decrease Outcome: Progressing   Problem: Pain Managment: Goal: General experience of comfort will improve Outcome: Progressing   

## 2022-08-15 NOTE — Anesthesia Procedure Notes (Signed)
Procedure Name: LMA Insertion Date/Time: 08/15/2022 12:03 AM  Performed by: Kizzie Fantasia, CRNAPre-anesthesia Checklist: Patient identified, Emergency Drugs available, Suction available and Patient being monitored Patient Re-evaluated:Patient Re-evaluated prior to induction Oxygen Delivery Method: Circle system utilized Preoxygenation: Pre-oxygenation with 100% oxygen Induction Type: IV induction Ventilation: Mask ventilation without difficulty LMA: LMA with gastric port inserted LMA Size: 4.0 Number of attempts: 1 Airway Equipment and Method: Bite block Placement Confirmation: positive ETCO2 Tube secured with: Tape Dental Injury: Teeth and Oropharynx as per pre-operative assessment

## 2022-08-15 NOTE — Progress Notes (Signed)
  Transition of Care [facility]) Screening Note   Patient Details  Name: Kendra Mcgrath Date of Birth: 03-30-1951   Transition of [facility]) CM/SW Contact:    Adrian Prows, RN Phone Number: 08/15/2022, 10:01 AM    Transition of Care Department [facility]) has reviewed patient and no TOC needs have been identified at this time. We will continue to monitor patient advancement through interdisciplinary progression rounds. If new patient transition needs arise, please place a TOC consult.

## 2022-08-15 NOTE — Anesthesia Postprocedure Evaluation (Signed)
Anesthesia Post Note  Patient: Kendra Mcgrath  Procedure(s) Performed: CLOSED MANIPULATION HIP (Left: Hip)     Patient location during evaluation: PACU Anesthesia Type: General Level of consciousness: awake and alert Pain management: pain level controlled Vital Signs Assessment: post-procedure vital signs reviewed and stable Respiratory status: spontaneous breathing, nonlabored ventilation, respiratory function stable and patient connected to nasal cannula oxygen Cardiovascular status: blood pressure returned to baseline and stable Postop Assessment: no apparent nausea or vomiting Anesthetic complications: no   No notable events documented.  Last Vitals:  Vitals:   08/14/22 2346 08/15/22 0021  BP: 139/74 139/82  Pulse: 100 97  Resp: (!) 28 18  Temp:  36.8 C  SpO2: 100% 100%    Last Pain:  Vitals:   08/15/22 0021  TempSrc:   PainSc: 1                  Beryle Lathe

## 2022-08-15 NOTE — Brief Op Note (Signed)
08/15/2022  12:16 AM  PATIENT:  Kendra Mcgrath  72 y.o. female  PRE-OPERATIVE DIAGNOSIS:  left hip dislocation  POST-OPERATIVE DIAGNOSIS:  Left hip dislocation  PROCEDURE:  Procedure(s): CLOSED MANIPULATION HIP (Left)  SURGEON:  Surgeon(s) and Role:    Venita Lick, MD - Primary  PHYSICIAN ASSISTANT:   ASSISTANTS: none   ANESTHESIA:   general   BLOOD ADMINISTERED:none  DRAINS: none   LOCAL MEDICATIONS USED:  NONE  SPECIMEN:  No Specimen  DISPOSITION OF SPECIMEN:  N/A  COUNTS:  YES  TOURNIQUET:  * No tourniquets in log *  DICTATION: .Dragon Dictation  PLAN OF CARE: Admit for overnight observation  PATIENT DISPOSITION:  PACU - hemodynamically stable.

## 2022-08-18 NOTE — Discharge Summary (Signed)
Patient ID: Kendra Mcgrath MRN: [id number] DOB/AGE: 11/23/1950 72 y.o.  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 female who was admitted 08/14/2022 for operative treatment ofClosed anterior dislocation of left hip. Patient has severe unremitting pain that affects sleep, daily activities, and work/hobbies. After pre-op clearance the patient was taken to the operating room on 08/15/2022 and underwent  Procedure(s): CLOSED MANIPULATION HIP.    Patient was given perioperative antibiotics:  Anti-infectives (From admission, onward)    None        Patient was given sequential compression devices, early ambulation, and chemoprophylaxis to prevent DVT.  Patient benefited maximally from hospital stay and there were no complications.    Recent vital signs: No data found.   Recent laboratory studies: No results for input(s): "WBC", "HGB", "HCT", "PLT", "NA", "K", "CL", "CO2", "BUN", "CREATININE", "GLUCOSE", "INR", "CALCIUM" in the last 72 hours.  Invalid input(s): "PT", "2"   Discharge Medications:   Allergies as of 08/15/2022       Reactions   Penicillins Hives, Itching, Rash   Has patient  had a PCN reaction causing immediate rash, facial/tongue/throat swelling, SOB or lightheadedness with hypotension: YES Has patient had a PCN reaction causing severe rash involving mucus membranes or skin necrosis: NO Has patient had a PCN reaction that required hospitalization NO Has patient had a PCN reaction occurring within the last 10 years: NO If all of the above answers are "NO", then may proceed with Cephalosporin use.   Tetanus Toxoid, Adsorbed Hives, Itching, Rash        Medication List     TAKE these medications    amLODipine 5 MG tablet Commonly known as: NORVASC Take 1 tablet (5 mg total) by mouth daily.   aspirin EC 81 MG tablet Take 1 tablet (81 mg total) by mouth daily. What changed:  when to take this reasons to take this   atorvastatin 40 MG tablet Commonly known as: LIPITOR *NEED OFFICE VISIT* TAKE 1 TABLET BY MOUTH EVERY DAY   benazepril-hydrochlorthiazide 20-25 MG tablet Commonly known as: LOTENSIN HCT TAKE 1 TABLET BY MOUTH EVERY DAY IN THE MORNING   buPROPion 150 MG 12 hr tablet Commonly known as: WELLBUTRIN SR TAKE 1 TABLET BY MOUTH EVERY DAY   diphenhydrAMINE 25 MG tablet Commonly known as: BENADRYL Take 75 mg by mouth at bedtime.   docusate sodium 100 MG capsule Commonly known as: COLACE Take 1 capsule (100 mg total) by mouth 2 (two) times daily as needed for mild constipation.   HYDROcodone-acetaminophen 5-325 MG tablet Commonly known as: NORCO/VICODIN Take 1 tablet by mouth every  6 (six) hours as needed for moderate pain (pain score 4-6).   levothyroxine 25 MCG tablet Commonly known as: SYNTHROID Take 25 mcg by mouth daily.   naproxen sodium 220 MG tablet Commonly known as: ALEVE Take 220 mg by mouth daily as needed (headaches/pain).   nitroGLYCERIN 0.4 MG SL tablet Commonly known as: Nitrostat Place 1 tablet (0.4 mg total) under the tongue every 5 (five) minutes as needed for chest pain.        Diagnostic Studies: DG HIP  UNILAT WITH PELVIS 1V LEFT  Result Date: 08/15/2022 CLINICAL DATA:  Closed reduction of left hip. EXAM: DG HIP (WITH OR WITHOUT PELVIS) 1V*L* COMPARISON:  August 14, 2022 FINDINGS: An intact total left hip replacement is seen. There is no evidence of an acute fracture or dislocation. Soft tissue structures are unremarkable. IMPRESSION: Intact total left hip replacement with interval reduction of the left hip dislocation seen on the prior study. Electronically Signed   By: Aram Candela M.D.   On: 08/15/2022 01:44   DG Hip Unilat W or Wo Pelvis 2-3 Views Left  Result Date: 08/14/2022 CLINICAL DATA:  Possible dislocation.  Pain EXAM: DG HIP (WITH OR WITHOUT PELVIS) 2V LEFT COMPARISON:  X-ray 01/13/2018 and older FINDINGS: Limited x-rays due to patient positioning. Left hip arthroplasty identified with a screw fixated acetabular cup and Press-Fit femoral component. The femoral component is dislocated from the cup posterior and superior. No separate underlying fracture. IMPRESSION: Dislocated left hip arthroplasty. Limited x-rays Electronically Signed   By: Karen Kays M.D.   On: 08/14/2022 21:27    Disposition: Discharge disposition: 01-Home or Self Care       Discharge Instructions     Call MD / Call 911   Complete by: As directed    If you experience chest pain or shortness of breath, CALL 911 and be transported to the hospital emergency room.  If you develope a fever above 101 F, pus (white drainage) or increased drainage or redness at the wound, or calf pain, call your surgeon's office.   Constipation Prevention   Complete by: As directed    Drink plenty of fluids.  Prune juice may be helpful.  You may use a stool softener, such as Colace (over the counter) 100 mg twice a day.  Use MiraLax (over the counter) for constipation as needed.   Diet - low sodium heart healthy   Complete by: As directed    Increase activity slowly as tolerated   Complete by: As directed    Post-operative  opioid taper instructions:   Complete by: As directed    POST-OPERATIVE OPIOID TAPER INSTRUCTIONS: It is important to wean off of your opioid medication as soon as possible. If you do not need pain medication after your surgery it is ok to stop day one. Opioids include: Codeine, Hydrocodone(Norco, Vicodin), Oxycodone(Percocet, oxycontin) and hydromorphone amongst others.  Long term and even short term use of opiods can cause: Increased pain response Dependence Constipation Depression Respiratory depression And more.  Withdrawal symptoms can include Flu like symptoms Nausea, vomiting And more Techniques to manage these symptoms Hydrate well Eat regular healthy meals Stay active Use relaxation techniques(deep breathing, meditating, yoga) Do Not substitute Alcohol to help with tapering If you have been on opioids for less than two weeks and do not have pain than it is ok to stop all together.  Plan to wean off of opioids This plan should start within one week post op of your joint replacement. Maintain  the same interval or time between taking each dose and first decrease the dose.  Cut the total daily intake of opioids by one tablet each day Next start to increase the time between doses. The last dose that should be eliminated is the evening dose.           Follow-up Information     Durene Romans, MD Follow up in 1 week(s).   Specialty: Orthopedic Surgery Why: Follow up xrays and evaluation Contact information: [address] [place] [address] [phone]         [facility]. Schedule an appointment as soon as possible for a visit .   Contact information: [address] [place] [address] [phone]                 Signed: Dorothy Spark 08/18/2022, 1:38 PM

## 2022-09-10 ENCOUNTER — Encounter: Payer: Self-pay | Admitting: Cardiology

## 2022-09-16 NOTE — Progress Notes (Addendum)
COVID Vaccine received:  []  No [x]  Yes Date of any COVID positive Test in last 90 days:  none  PCP - Tally Joe, MD at [facility]  [phone] Cardiologist - Yates Decamp, MD  (Appt for cardiac clearance is 09-21-2022)  Chest x-ray - 2019  1v  Epic EKG -  02-23-2021  Epic   Will Repeat at PST Stress Test -  ECHO -  Cardiac Cath - LHC x 2  Last on 11-02-2016 by Dr. Jacinto Halim  PCR screen: []  Ordered & Completed           []   No Order but Needs PROFEND           []   N/A for this surgery  Surgery Plan:  []  Ambulatory                            []  Outpatient in bed                            [x]  Admit  Anesthesia:    []  General  [x]  Spinal                           []   Choice []   MAC  Pacemaker / ICD device [x]  No []  Yes   Spinal Cord Stimulator:[x]  No []  Yes       History of Sleep Apnea? [x]  No []  Yes   CPAP used?- [x]  No []  Yes    Does the patient monitor blood sugar?          []  No []  Yes  [x]  N/A  Patient has: [x]  NO Hx DM   []  Pre-DM                 []  DM1  []   DM2  Blood Thinner / Instructions: none Aspirin Instructions: none  ERAS Protocol Ordered: []  No  [x]  Yes PRE-SURGERY [x]  ENSURE  []  G2  Patient is to be NPO after: 07:30  Patient was given the 5 CHG shower / bath instructions for THA / TKA / Total or Reverse Shoulder arthroplasty surgery along with 2 bottles of the CHG soap. Patient will start this on:  Sunday  Sep 19, 2022  All questions were asked and answered, Patient voiced understanding of this process.    Activity level: Patient is able to climb a flight of stairs without difficulty; [x]  No CP  but would have SOB.  Patient can / can not perform ADLs without assistance.   Anesthesia review: HTN, anemia, PVD claudication, CAD- LHC x 2 no PCI, [place], COPD, CKD3a  Patient denies shortness of breath, fever, cough and chest pain at PAT appointment.  Patient verbalized understanding and agreement to the Pre-Surgical Instructions that were given to them at this PAT  appointment. Patient was also educated of the need to review these PAT instructions again prior to her surgery.I reviewed the appropriate phone numbers to call if they have any and questions or concerns.

## 2022-09-16 NOTE — Patient Instructions (Addendum)
SURGICAL WAITING ROOM VISITATION Patients having surgery or a procedure may have no more than 2 support people in the waiting area - these visitors may rotate in the visitor waiting room.   Due to an increase in RSV and influenza rates and associated hospitalizations, children ages 1 and under may not visit patients in [facility] hospitals. If the patient needs to stay at the hospital during part of their recovery, the visitor guidelines for inpatient rooms apply.  PRE-OP VISITATION  Pre-op nurse will coordinate an appropriate time for 1 support person to accompany the patient in pre-op.  This support person may not rotate.  This visitor will be contacted when the time is appropriate for the visitor to come back in the pre-op area.  Please refer to the [facility] website for the visitor guidelines for Inpatients (after your surgery is over and you are in a regular room).  You are not required to quarantine at this time prior to your surgery. However, you must do this: Hand Hygiene often Do NOT share personal items Notify your provider if you are in close contact with someone who has COVID or you develop fever 100.4 or greater, new onset of sneezing, cough, sore throat, shortness of breath or body aches.  If you test positive for Covid or have been in contact with anyone that has tested positive in the last 10 days please notify you surgeon.    Your procedure is scheduled on:  Thursday  Sep 23, 2022  Report to [facility] Main Entrance: Kendra Mcgrath entrance where the [employer] is available.   Report to admitting at: 08:00    AM  +++++Call this number if you have any questions or problems the morning of surgery [phone]  Do not eat food after Midnight the night prior to your surgery/procedure.  After Midnight you may have the following liquids until  07:30 AM  DAY OF SURGERY  Clear Liquid Diet Water Black Coffee (sugar ok, NO MILK/CREAM OR CREAMERS)  Tea (sugar ok, NO  MILK/CREAM OR CREAMERS) regular and decaf                             Plain Jell-O  with no fruit (NO RED)                                           Fruit ices (not with fruit pulp, NO RED)                                     Popsicles (NO RED)                                                                  Juice: apple, WHITE grape, WHITE cranberry Sports drinks like Gatorade or Powerade (NO RED)                    The day of surgery:  Drink ONE (1) Pre-Surgery Clear Ensure at 07:30  AM the morning of surgery. Drink in one  sitting. Do not sip.  This drink was given to you during your hospital pre-op appointment visit. Nothing else to drink after completing the Pre-Surgery Clear Ensure : No candy, chewing gum or throat lozenges.    FOLLOW ANY ADDITIONAL PRE OP INSTRUCTIONS YOU RECEIVED FROM YOUR SURGEON'S OFFICE!!!   Oral Hygiene is also important to reduce your risk of infection.        Remember - BRUSH YOUR TEETH THE MORNING OF SURGERY WITH YOUR REGULAR TOOTHPASTE  Do NOT smoke after Midnight the night before surgery.  Take ONLY these medicines the morning of surgery with A SIP OF WATER: Levothyroxine, amlodipine, and Hydrocodone if needed for pain.  You may not have any metal on your body including hair pins, jewelry, and body piercing  Do not wear make-up, lotions, powders, perfumes or deodorant  Do not wear nail polish including gel and S&S, artificial / acrylic nails, or any other type of covering on natural nails including finger and toenails. If you have artificial nails, gel coating, etc., that needs to be removed by a nail salon, Please have this removed prior to surgery. Not doing so may mean that your surgery could be cancelled or delayed if the Surgeon or anesthesia staff feels like they are unable to monitor you safely.   Do not shave 48 hours prior to surgery to avoid nicks in your skin which may contribute to postoperative infections.   Contacts, Hearing Aids,  dentures or bridgework may not be worn into surgery. DENTURES WILL BE REMOVED PRIOR TO SURGERY PLEASE DO NOT APPLY "Poly grip" OR ADHESIVES!!!  You may bring a small overnight bag with you on the day of surgery, only pack items that are not valuable. [facility] IS NOT RESPONSIBLE   FOR VALUABLES THAT ARE LOST OR STOLEN.    Do not bring your home medications to the hospital. The Pharmacy will dispense medications listed on your medication list to you during your admission in the Hospital.  Special Instructions: Bring a copy of your healthcare power of attorney and living will documents the day of surgery, if you wish to have them scanned into your [facility]- EPIC  Please read over the following fact sheets you were given: IF YOU HAVE QUESTIONS ABOUT YOUR PRE-OP INSTRUCTIONS, PLEASE CALL [phone].         Pre-operative 5 CHG Bath Instructions   You can play a key role in reducing the risk of infection after surgery. Your skin needs to be as free of germs as possible. You can reduce the number of germs on your skin by washing with CHG (chlorhexidine gluconate) soap before surgery. CHG is an antiseptic soap that kills germs and continues to kill germs even after washing.   DO NOT use if you have an allergy to chlorhexidine/CHG or antibacterial soaps. If your skin becomes reddened or irritated, stop using the CHG and notify one of our RNs at [phone]  Please shower with the CHG soap starting 4 days before surgery using the following schedule: START SHOWERS ON SUNDAY Sep 19, 2022  Please keep in mind the following:  DO NOT shave, including legs and underarms, starting the day of your first shower.   You may shave your face at any point before/day of surgery.   Place clean sheets on  your bed the day you start using CHG soap. Use a clean washcloth (not used since being washed) for each shower. DO NOT sleep with pets once you start using the CHG.   CHG Shower Instructions:  If you choose to wash your hair and private area, wash first with your normal shampoo/soap.  After you use shampoo/soap, rinse your hair and body thoroughly to remove shampoo/soap residue.  Turn the water OFF and apply about 3 tablespoons (45 ml) of CHG soap to a CLEAN washcloth.  Apply CHG soap ONLY FROM YOUR NECK DOWN TO YOUR TOES (washing for 3-5 minutes)  DO NOT use CHG soap on face, private areas, open wounds, or sores.  Pay special attention to the area where your surgery is being performed.  If you are having back surgery, having someone wash your back for you may be helpful.  Wait 2 minutes after CHG soap is applied, then you may rinse off the CHG soap.  Pat dry with a clean towel  Put on clean clothes/pajamas   If you choose to wear lotion, please use ONLY the CHG-compatible lotions on the back of this paper.     Additional instructions for the day of surgery: DO NOT APPLY any lotions, deodorants, cologne, or perfumes.   Put on clean/comfortable clothes.  Brush your teeth.  Ask your nurse before applying any prescription medications to the skin.      CHG Compatible Lotions   Aveeno Moisturizing lotion  Cetaphil Moisturizing Cream  Cetaphil Moisturizing Lotion  Clairol Herbal Essence Moisturizing Lotion, Dry Skin  Clairol Herbal Essence Moisturizing Lotion, Extra Dry Skin  Clairol Herbal Essence Moisturizing Lotion, Normal Skin  Curel Age Defying Therapeutic Moisturizing Lotion with Alpha Hydroxy  Curel Extreme Care Body Lotion  Curel Soothing Hands Moisturizing Hand Lotion  Curel Therapeutic Moisturizing Cream, Fragrance-Free  Curel Therapeutic Moisturizing Lotion, Fragrance-Free  Curel Therapeutic Moisturizing Lotion, Original Formula  Eucerin Daily Replenishing Lotion   Eucerin Dry Skin Therapy Plus Alpha Hydroxy Crme  Eucerin Dry Skin Therapy Plus Alpha Hydroxy Lotion  Eucerin Original Crme  Eucerin Original Lotion  Eucerin Plus Crme Eucerin Plus Lotion  Eucerin TriLipid Replenishing Lotion  Keri Anti-Bacterial Hand Lotion  Keri Deep Conditioning Original Lotion Dry Skin Formula Softly Scented  Keri Deep Conditioning Original Lotion, Fragrance Free Sensitive Skin Formula  Keri Lotion Fast Absorbing Fragrance Free Sensitive Skin Formula  Keri Lotion Fast Absorbing Softly Scented Dry Skin Formula  Keri Original Lotion  Keri Skin Renewal Lotion Keri Silky Smooth Lotion  Keri Silky Smooth Sensitive Skin Lotion  Nivea Body Creamy Conditioning Oil  Nivea Body Extra Enriched Lotion  Nivea Body Original Lotion  Nivea Body Sheer Moisturizing Lotion Nivea Crme  Nivea Skin Firming Lotion  NutraDerm 30 Skin Lotion  NutraDerm Skin Lotion  NutraDerm Therapeutic Skin Cream  NutraDerm Therapeutic Skin Lotion  ProShield Protective Hand Cream  Provon moisturizing lotion  ON THE DAY OF SURGERY : Do not apply any lotions/deodorants the morning of surgery.  Please wear clean clothes to the hospital/surgery center.    FAILURE TO FOLLOW THESE INSTRUCTIONS MAY RESULT IN THE CANCELLATION OF YOUR SURGERY  PATIENT SIGNATURE_________________________________  NURSE SIGNATURE__________________________________  ________________________________________________________________________       Kendra Mcgrath    An incentive spirometer  is a tool that can help keep your lungs clear and active. This tool measures how well you are filling your lungs with each breath. Taking long deep breaths may help reverse or decrease the chance of developing breathing (pulmonary) problems (especially infection) following: A long period of time when you are unable to move or be active. BEFORE THE PROCEDURE  If the spirometer includes an indicator to show your best effort,  your nurse or respiratory therapist will set it to a desired goal. If possible, sit up straight or lean slightly forward. Try not to slouch. Hold the incentive spirometer in an upright position. INSTRUCTIONS FOR USE  Sit on the edge of your bed if possible, or sit up as far as you can in bed or on a chair. Hold the incentive spirometer in an upright position. Breathe out normally. Place the mouthpiece in your mouth and seal your lips tightly around it. Breathe in slowly and as deeply as possible, raising the piston or the ball toward the top of the column. Hold your breath for 3-5 seconds or for as long as possible. Allow the piston or ball to fall to the bottom of the column. Remove the mouthpiece from your mouth and breathe out normally. Rest for a few seconds and repeat Steps 1 through 7 at least 10 times every 1-2 hours when you are awake. Take your time and take a few normal breaths between deep breaths. The spirometer may include an indicator to show your best effort. Use the indicator as a goal to work toward during each repetition. After each set of 10 deep breaths, practice coughing to be sure your lungs are clear. If you have an incision (the cut made at the time of surgery), support your incision when coughing by placing a pillow or rolled up towels firmly against it. Once you are able to get out of bed, walk around indoors and cough well. You may stop using the incentive spirometer when instructed by your caregiver.  RISKS AND COMPLICATIONS Take your time so you do not get dizzy or light-headed. If you are in pain, you may need to take or ask for pain medication before doing incentive spirometry. It is harder to take a deep breath if you are having pain. AFTER USE Rest and breathe slowly and easily. It can be helpful to keep track of a log of your progress. Your caregiver can provide you with a simple table to help with this. If you are using the spirometer at home, follow these  instructions: SEEK MEDICAL CARE IF:  You are having difficultly using the spirometer. You have trouble using the spirometer as often as instructed. Your pain medication is not giving enough relief while using the spirometer. You develop fever of 100.5 F (38.1 C) or higher.                                                                                                    SEEK IMMEDIATE MEDICAL CARE IF:  You cough up bloody sputum that had not been present before. You develop fever of 102  F (38.9 C) or greater. You develop worsening pain at or near the incision site. MAKE SURE YOU:  Understand these instructions. Will watch your condition. Will get help right away if you are not doing well or get worse. Document Released: 08/23/2006 Document Revised: 07/05/2011 Document Reviewed: 10/24/2006 [facility] Patient Information 2014 [place], [place].      WHAT IS A BLOOD TRANSFUSION? Blood Transfusion Information  A transfusion is the replacement of blood or some of its parts. Blood is made up of multiple cells which provide different functions. Red blood cells carry oxygen and are used for blood loss replacement. White blood cells fight against infection. Platelets control bleeding. Plasma helps clot blood. Other blood products are available for specialized needs, such as hemophilia or other clotting disorders. BEFORE THE TRANSFUSION  Who gives blood for transfusions?  Healthy volunteers who are fully evaluated to make sure their blood is safe. This is blood bank blood. Transfusion therapy is the safest it has ever been in the practice of medicine. Before blood is taken from a donor, a complete history is taken to make sure that person has no history of diseases nor engages in risky social behavior (examples are intravenous drug use or sexual activity with multiple partners). The donor's travel history is screened to minimize risk of transmitting infections, such as malaria. The donated  blood is tested for signs of infectious diseases, such as HIV and hepatitis. The blood is then tested to be sure it is compatible with you in order to minimize the chance of a transfusion reaction. If you or a relative donates blood, this is often done in anticipation of surgery and is not appropriate for emergency situations. It takes many days to process the donated blood. RISKS AND COMPLICATIONS Although transfusion therapy is very safe and saves many lives, the main dangers of transfusion include:  Getting an infectious disease. Developing a transfusion reaction. This is an allergic reaction to something in the blood you were given. Every precaution is taken to prevent this. The decision to have a blood transfusion has been considered carefully by your caregiver before blood is given. Blood is not given unless the benefits outweigh the risks. AFTER THE TRANSFUSION Right after receiving a blood transfusion, you will usually feel much better and more energetic. This is especially true if your red blood cells have gotten low (anemic). The transfusion raises the level of the red blood cells which carry oxygen, and this usually causes an energy increase. The nurse administering the transfusion will monitor you carefully for complications. HOME CARE INSTRUCTIONS  No special instructions are needed after a transfusion. You may find your energy is better. Speak with your caregiver about any limitations on activity for underlying diseases you may have. SEEK MEDICAL CARE IF:  Your condition is not improving after your transfusion. You develop redness or irritation at the intravenous (IV) site. SEEK IMMEDIATE MEDICAL CARE IF:  Any of the following symptoms occur over the next 12 hours: Shaking chills. You have a temperature by mouth above 102 F (38.9 C), not controlled by medicine. Chest, back, or muscle pain. People around you feel you are not acting correctly or are confused. Shortness of breath or  difficulty breathing. Dizziness and fainting. You get a rash or develop hives. You have a decrease in urine output. Your urine turns a dark color or changes to pink, red, or brown. Any of the following symptoms occur over the next 10 days: You have a temperature by mouth above 102  F (38.9 C), not controlled by medicine. Shortness of breath. Weakness after normal activity. The white part of the eye turns yellow (jaundice). You have a decrease in the amount of urine or are urinating less often. Your urine turns a dark color or changes to pink, red, or brown. Document Released: 04/09/2000 Document Revised: 07/05/2011 Document Reviewed: 11/27/2007 [facility] Patient Information 2014 [place], [place].  _______________________________________________________________________

## 2022-09-17 ENCOUNTER — Encounter (HOSPITAL_COMMUNITY): Payer: Self-pay

## 2022-09-17 ENCOUNTER — Encounter (HOSPITAL_COMMUNITY)
Admission: RE | Admit: 2022-09-17 | Discharge: 2022-09-17 | Disposition: A | Discharge status: Home / Self Care | Payer: Medicare Other | Source: Ambulatory Visit | Attending: Orthopedic Surgery | Admitting: Orthopedic Surgery

## 2022-09-17 ENCOUNTER — Other Ambulatory Visit: Payer: Self-pay

## 2022-09-17 VITALS — BP 127/64 | HR 69 | Temp 98.0°F | Resp 18 | Ht 65.0 in | Wt 183.0 lb

## 2022-09-17 DIAGNOSIS — I2510 Atherosclerotic heart disease of native coronary artery without angina pectoris: Secondary | ICD-10-CM | POA: Diagnosis not present

## 2022-09-17 DIAGNOSIS — M25352 Other instability, left hip: Secondary | ICD-10-CM | POA: Insufficient documentation

## 2022-09-17 DIAGNOSIS — S73035D Other anterior dislocation of left hip, subsequent encounter: Secondary | ICD-10-CM | POA: Insufficient documentation

## 2022-09-17 DIAGNOSIS — Z96642 Presence of left artificial hip joint: Secondary | ICD-10-CM | POA: Diagnosis not present

## 2022-09-17 DIAGNOSIS — I444 Left anterior fascicular block: Secondary | ICD-10-CM | POA: Diagnosis not present

## 2022-09-17 DIAGNOSIS — X58XXXD Exposure to other specified factors, subsequent encounter: Secondary | ICD-10-CM | POA: Insufficient documentation

## 2022-09-17 DIAGNOSIS — Z87891 Personal history of nicotine dependence: Secondary | ICD-10-CM | POA: Insufficient documentation

## 2022-09-17 DIAGNOSIS — Z01818 Encounter for other preprocedural examination: Secondary | ICD-10-CM | POA: Diagnosis not present

## 2022-09-17 HISTORY — DX: Achalasia of cardia: K22.0

## 2022-09-17 HISTORY — DX: Chronic kidney disease, unspecified: N18.9

## 2022-09-17 LAB — BASIC METABOLIC PANEL
Anion gap: 8 (ref 5–15)
BUN: 21 mg/dL (ref 8–23)
CO2: 27 mmol/L (ref 22–32)
Calcium: 9.1 mg/dL (ref 8.9–10.3)
Chloride: 102 mmol/L (ref 98–111)
Creatinine, Ser: 1.06 mg/dL — ABNORMAL HIGH (ref 0.44–1.00)
GFR, Estimated: 55 mL/min — ABNORMAL LOW (ref >60)
Glucose, Bld: 84 mg/dL (ref 70–99)
Potassium: 4 mmol/L (ref 3.5–5.1)
Sodium: 137 mmol/L (ref 135–145)

## 2022-09-17 LAB — CBC
HCT: 39.7 % (ref 36.0–46.0)
Hemoglobin: 12.8 g/dL (ref 12.0–15.0)
MCH: 28.3 pg (ref 26.0–34.0)
MCHC: 32.2 g/dL (ref 30.0–36.0)
MCV: 87.8 fL (ref 80.0–100.0)
Platelets: 244 10*3/uL (ref 150–400)
RBC: 4.52 MIL/uL (ref 3.87–5.11)
RDW: 13.5 % (ref 11.5–15.5)
WBC: 7.4 10*3/uL (ref 4.0–10.5)
nRBC: 0 % (ref 0.0–0.2)

## 2022-09-17 LAB — SURGICAL PCR SCREEN
MRSA, PCR: NEGATIVE
Staphylococcus aureus: NEGATIVE

## 2022-09-21 ENCOUNTER — Ambulatory Visit: Payer: Medicare Other | Admitting: Cardiology

## 2022-09-21 ENCOUNTER — Encounter: Payer: Self-pay | Admitting: Cardiology

## 2022-09-21 VITALS — BP 130/76 | HR 71 | Ht 65.0 in | Wt 186.0 lb

## 2022-09-21 DIAGNOSIS — I2510 Atherosclerotic heart disease of native coronary artery without angina pectoris: Secondary | ICD-10-CM | POA: Diagnosis not present

## 2022-09-21 DIAGNOSIS — E782 Mixed hyperlipidemia: Secondary | ICD-10-CM | POA: Diagnosis not present

## 2022-09-21 DIAGNOSIS — I10 Essential (primary) hypertension: Secondary | ICD-10-CM | POA: Diagnosis not present

## 2022-09-21 MED ORDER — EZETIMIBE 10 MG PO TABS
10.0000 mg | ORAL_TABLET | Freq: Every day | ORAL | 3 refills | Status: AC
Start: 2022-09-21 — End: 2022-12-20

## 2022-09-21 NOTE — Progress Notes (Signed)
Primary Physician/Referring:  Tally Joe, MD  Patient ID: Kendra Mcgrath, female    DOB: 08-25-1950, 72 y.o.   MRN: [id number]  Chief Complaint  Patient presents with   Surgical clearance   HPI: Kendra Mcgrath  is a 72 y.o. 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  Has very mild coronary artery disease by cardiac catheterization in 2018 with a 50% OM1 stenosis.   Patient has been scheduled for left hip arthroplasty, this the fourth surgery.  This is a preop evaluation.  Patient is asymptomatic with regard to cardiac status, states that she has lost about 20 to 25 pounds in weight over the past 2 years.   Past Medical History:  Diagnosis Date   Anemia    hx of    Anginal pain (HCC)    ongoing for last 4-5 years (reported in 08/2014); had normal stress test 08/30/14 [facility] CV)   Cardiospasm    Chronic kidney disease    Headache    History of MRSA infection    2012 per patient on left ear    HTN (hypertension) 12/04/2018   Hypercholesterolemia    Hypoglycemia    Lumbar herniated disc    Osteoarthritis of spine with radiculopathy, lumbar region 04/01/2015   Pneumonia    hx of pneumonia    Prolapsed uterus     Past Surgical History:  Procedure Laterality Date   ABDOMINAL AORTOGRAM W/LOWER EXTREMITY N/A 02/12/2020   Procedure: ABDOMINAL AORTOGRAM W/LOWER EXTREMITY;  Surgeon: Yates Decamp, MD;  Location: [facility] INVASIVE CV LAB;  Service: Cardiovascular;  Laterality: N/A;   ANTERIOR HIP REVISION Left 09/09/2014   Procedure: LEFT TOTAL POSTERIOR HIP REVISION;  Surgeon: Durene Romans, MD;  Location: [facility] ORS;  Service: Orthopedics;  Laterality: Left;   CARDIAC CATHETERIZATION N/A 02/16/2016   Procedure: Left Heart Cath and Coronary Angiography;  Surgeon: Yates Decamp, MD;   Location: [facility] INVASIVE CV LAB;  Service: Cardiovascular;  Laterality: N/A;   CORONARY PRESSURE/FFR STUDY N/A 11/02/2016   Procedure: Intravascular Pressure Wire/FFR Study;  Surgeon: Yates Decamp, MD;  Location: [facility] INVASIVE CV LAB;  Service: Cardiovascular;  Laterality: N/A;   DILATION AND CURETTAGE OF UTERUS     HIP CLOSED REDUCTION Left 01/13/2018   Procedure: CLOSED MANIPULATION HIP;  Surgeon: Samson Frederic, MD;  Location: [facility] ORS;  Service: Orthopedics;  Laterality: Left;   HIP CLOSED REDUCTION Left 08/14/2022   Procedure: CLOSED MANIPULATION HIP;  Surgeon: Venita Lick, MD;  Location: [facility] ORS;  Service: Orthopedics;  Laterality: Left;   JOINT REPLACEMENT     left hip   LEFT HEART CATH AND CORONARY ANGIOGRAPHY N/A 11/02/2016   Procedure: Left Heart Cath and Coronary Angiography;  Surgeon: Yates Decamp, MD;  Location: [facility] INVASIVE CV LAB;  Service: Cardiovascular;  Laterality: N/A;   left hip pinning      LOWER EXTREMITY ANGIOGRAPHY Bilateral 02/12/2020   Procedure: Bilateral LOWER EXTREMITY ANGIOGRAPHY;  Surgeon: Yates Decamp, MD;  [facility]: [facility] INVASIVE CV LAB;  Service: Cardiovascular;  Laterality: Bilateral;   LUMBAR LAMINECTOMY/DECOMPRESSION MICRODISCECTOMY Left 04/01/2015   Procedure: Left lumbar three to lumbar four microdiskectomy Right lumbar four to five discectomy redo;  Surgeon: Coletta Memos, MD;  Location: [facility] NEURO ORS;  Service: Neurosurgery;  Laterality: Left;  Left L34 microdiskectomy   TONSILLECTOMY  Social History   Tobacco Use   Smoking status: Former    Packs/day: 0.50    Years: 50.00    Additional pack years: 0.00    Total pack years: 25.00    Types: Cigarettes   Smokeless tobacco: Never   Tobacco comments:    YEAR AGO  Substance Use Topics   Alcohol use: Yes    Comment: rare   Marital Status: Married   Review of Systems  Cardiovascular:  Negative for chest pain, dyspnea on exertion and leg swelling.   Objective  Blood pressure 130/76, pulse 71, height 5\' 5"   (1.651 m), weight 186 lb (84.4 kg), SpO2 96 %. Body mass index is 30.95 kg/m.     09/21/2022    4:14 PM 09/17/2022    2:14 PM 08/15/2022    5:17 AM  Vitals with BMI  Height 5\' 5"  5\' 5"    Weight 186 lbs 183 lbs   BMI 30.95 30.45   Systolic 130 127 098  Diastolic 76 64 60  Pulse 71 69 61     Physical Exam Neck:     Vascular: No carotid bruit or JVD.  Cardiovascular:     Rate and Rhythm: Normal rate and regular rhythm.     Pulses: Intact distal pulses.     Heart sounds: Normal heart sounds. No murmur heard.    No gallop.  Pulmonary:     Effort: Pulmonary effort is normal.     Breath sounds: Normal breath sounds.  Abdominal:     General: Bowel sounds are normal.     Palpations: Abdomen is soft.  Musculoskeletal:     Right lower leg: No edema.     Left lower leg: No edema.    Radiology: No results found.  Laboratory examination:       Latest Ref Rng & Units 09/17/2022    2:56 PM 08/14/2022    9:40 PM 01/31/2020   11:14 AM  CMP  Glucose 70 - 99 mg/dL 84  97  78   BUN 8 - 23 mg/dL 21  25  16    Creatinine 0.44 - 1.00 mg/dL 1.19  1.47  8.29   Sodium 135 - 145 mmol/L 137  135  138   Potassium 3.5 - 5.1 mmol/L 4.0  4.0  5.0   Chloride 98 - 111 mmol/L 102  100  99   CO2 22 - 32 mmol/L 27  25  24    Calcium 8.9 - 10.3 mg/dL 9.1  9.1  56.2   Total Protein 6.0 - 8.5 g/dL   7.0   Total Bilirubin 0.0 - 1.2 mg/dL   0.3   Alkaline Phos 44 - 121 IU/L   69   AST 0 - 40 IU/L   19   ALT 0 - 32 IU/L   23       Latest Ref Rng & Units 09/17/2022    2:56 PM 08/14/2022    9:40 PM 01/31/2020   11:14 AM  CBC  WBC 4.0 - 10.5 K/uL 7.4  10.8  7.4   Hemoglobin 12.0 - 15.0 g/dL 13.0  86.5  78.4   Hematocrit 36.0 - 46.0 % 39.7  36.2  41.9   Platelets 150 - 400 K/uL 244  215  227    External labs: Cholesterol, total 195.000 m 06/28/2022 HDL 61.000 mg 06/28/2022 LDL 117.000 m 06/28/2022 Triglycerides 94.000 mg 06/28/2022  TSH 2.680 06/28/2022  Cardiac Studies:   Coronary angiography  2016/11/16 with FFR to OM-2: 50%  stenosis.  Hemodynamically not significant.  Otherwise no significant disease.  Normal LVEF. Stenosis in D1 not present and 80% OM now 50% compared to Coronary Angiography 02/16/2016.  Carotid artery duplex 11/21/2014: Mild stenosis in the left external carotid artery (<50%).  Follow up in one year is appropriate if clinically indicated.  Lower Extremity Arterial Duplex 11/23/2018: No hemodynamically significant stenoses are identified in the lower extremity arterial system.  This exam reveals normal perfusion of the right lower extremity (ABI 1.36) and normal perfusion of the left lower extremity (ABI 1.32).  Mildly abnormal biphasic waveform at the left ankle.  Abdominal aortogram and peripheral arteriogram with lower extremity bilateral runoff 02/12/2020: No significant peripheral arterial disease.  There are 2 renal arteries on either side which are widely patent.  There is mild atherosclerotic changes noted in the abdominal aorta. Bilateral iliac arteries show mild disease.  Otherwise there is smooth vessels below the inguinal ligament with three-vessel runoff below the bilateral knee.   Impression: Please evaluate for pseudoclaudication.  EKG:  EKG 09/21/2022: Normal sinus rhythm at rate of 69 bpm, LAE, left atrial fascicular block, incomplete right bundle branch block.  No evidence of ischemia.  Compared to 02/23/2021, no significant change.    Allergies & Medications   Allergies  Allergen Reactions   Penicillins Hives, Itching and Rash   Tetanus Toxoid Hives, Itching and Rash    Current Outpatient Medications:    ALEVE 220 MG tablet, Take 220-440 mg by mouth 2 (two) times daily as needed (for headaches or pain)., Disp: , Rfl:    atorvastatin (LIPITOR) 40 MG tablet, *NEED OFFICE VISIT* TAKE 1 TABLET BY MOUTH EVERY DAY (Patient taking differently: Take 40 mg by mouth in the morning.), Disp: 90 tablet, Rfl: 0   benazepril-hydrochlorthiazide (LOTENSIN  HCT) 20-25 MG tablet, TAKE 1 TABLET BY MOUTH EVERY DAY IN THE MORNING (Patient taking differently: Take 1 tablet by mouth in the morning.), Disp: 90 tablet, Rfl: 1   Cyanocobalamin (B-12 SUPER STRENGTH) 5000 MCG/ML LIQD, Place 5,000 mcg under the tongue in the morning., Disp: , Rfl:    diphenhydrAMINE (BENADRYL) 25 MG tablet, Take 25-75 mg by mouth at bedtime as needed for sleep., Disp: , Rfl:    HYDROcodone-acetaminophen (NORCO/VICODIN) 5-325 MG tablet, Take 1 tablet by mouth every 6 (six) hours as needed for moderate pain (pain score 4-6)., Disp: 10 tablet, Rfl: 0   levothyroxine (SYNTHROID, LEVOTHROID) 25 MCG tablet, Take 25 mcg by mouth daily before breakfast., Disp: , Rfl: 5   Magnesium 250 MG TABS, Take 250 mg by mouth in the morning., Disp: , Rfl:    Misc Natural Products (OSTEO BI-FLEX ADV TRIPLE ST) TABS, Take 1 tablet by mouth in the morning., Disp: , Rfl:    nitroGLYCERIN (NITROSTAT) 0.4 MG SL tablet, Place 1 tablet (0.4 mg total) under the tongue every 5 (five) minutes as needed for chest pain., Disp: 25 tablet, Rfl: 2   NON FORMULARY, Take 1 tablet by mouth See admin instructions. Focus Factor tablets- Take 1 tablet by mouth in the morning, Disp: , Rfl:    Assessment     ICD-10-CM   1. Coronary artery disease involving native coronary artery of native heart without angina pectoris  I25.10 EKG 12-Lead    2. Essential hypertension  I10     3. Mixed hyperlipidemia  E78.2       No orders of the defined types were placed in this encounter.   Medications Discontinued During This Encounter  Medication Reason  docusate sodium (COLACE) 100 MG capsule    amLODipine (NORVASC) 5 MG tablet Patient Preference   aspirin EC 81 MG tablet    buPROPion (WELLBUTRIN SR) 150 MG 12 hr tablet      Recommendations:   Kendra Mcgrath  is a 72 y.o. Caucasian female with past medical history of hypertension, hyperlipidemia, tobacco use quit in April 2017, h/o  recurrent left hip dislocation s/p left  hip replacement May 2016 again now has had dislocation and needs revision and sent to me for preop evaluation.  Has very mild coronary artery disease by cardiac catheterization in 2018 with a 50% OM1 stenosis.  1. Coronary artery disease involving native coronary artery of native heart without angina pectoris Patient has had complete resolution of anginal symptoms, over the past 1 year she has been off of amlodipine and 2 years ago she has been off of Ranexa.  No further evaluation is indicated.  I suspect with medical therapy, her 50% OM1 stenosis is probably minimal now.  She continues to remain active.  From surgical standpoint, she can be taken up for the left hip revision arthroplasty with low risk. - EKG 12-Lead - ezetimibe (ZETIA) 10 MG tablet; Take 1 tablet (10 mg total) by mouth daily.  Dispense: 90 tablet; Refill: 3  2. Essential hypertension Blood pressure is well-controlled on benazepril HCT, continue the same.  She is now off of amlodipine and her blood pressure is still well-controlled.  She is also lost about 20 pounds in weight over the past 2 years and this is certainly helped.  3. Mixed hyperlipidemia I reviewed her external labs, LDL is not at goal, at least maintaining LDL to <100 would be appropriate.  I have added Zetia 10 mg daily.  She can do follow-up and refill prescriptions from Dr. Ronnie Doss.  I will see her back on a as needed basis. - ezetimibe (ZETIA) 10 MG tablet; Take 1 tablet (10 mg total) by mouth daily.  Dispense: 90 tablet; Refill: 3    Yates Decamp, MD, [facility] 09/21/2022, 4:31 PM Office: [phone]

## 2022-09-22 NOTE — Anesthesia Preprocedure Evaluation (Addendum)
Anesthesia Evaluation  Patient identified by MRN, date of birth, ID band Patient awake    Reviewed: Allergy & Precautions, NPO status , Patient's Chart, lab work & pertinent test results  History of Anesthesia Complications Negative for: history of anesthetic complications  Airway Mallampati: I  TM Distance: >3 FB Neck ROM: Full    Dental  (+) Edentulous Upper, Edentulous Lower, Dental Advisory Given   Pulmonary Patient did not abstain from smoking., former smoker   Pulmonary exam normal        Cardiovascular hypertension, Pt. on medications Normal cardiovascular exam  Coronary angiogram 11/02/2016: There was a 50-60 % stenosis in the secondary branch of OM 2, minimal disease in the diagonal and RV branch of RCA. FFR to OM 2 was negative for hemodynamic significance, 0.94.      Neuro/Psych  Headaches  negative psych ROS   GI/Hepatic negative GI ROS, Neg liver ROS,,,  Endo/Other  Hypothyroidism   Obesity   Renal/GU negative Renal ROS     Musculoskeletal  (+) Arthritis , Osteoarthritis,    Abdominal   Peds  Hematology negative hematology ROS (+)   Anesthesia Other Findings   Reproductive/Obstetrics                             Anesthesia Physical Anesthesia Plan  ASA: 3  Anesthesia Plan: General   Post-op Pain Management: Minimal or no pain anticipated, Tylenol PO (pre-op)* and Toradol IV (intra-op)*   Induction: Intravenous  PONV Risk Score and Plan: 3 and Treatment may vary due to age or medical condition, Ondansetron, Dexamethasone and Propofol infusion  Airway Management Planned: LMA  Additional Equipment: None  Intra-op Plan:   Post-operative Plan:   Informed Consent: I have reviewed the patients History and Physical, chart, labs and discussed the procedure including the risks, benefits and alternatives for the proposed anesthesia with the patient or authorized  representative who has indicated his/her understanding and acceptance.     Dental advisory given  Plan Discussed with: Anesthesiologist and CRNA  Anesthesia Plan Comments: (See PAT note 09/17/2022)        Anesthesia Quick Evaluation

## 2022-09-22 NOTE — Progress Notes (Signed)
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 Durene Romans.   Pt seen by cardiology 09/21/2022.  Per office visit note, "Patient has had complete resolution of anginal symptoms, over the past 1 year she has been off of amlodipine and 2 years ago she has been off of Ranexa.  No further evaluation is indicated.  I suspect with medical therapy, her 50% OM1 stenosis is probably minimal now.  She continues to remain active.   From surgical standpoint, she can be taken up for the left hip revision arthroplasty with low risk."  Anticipate pt can proceed with planned procedure barring acute status change.   VS: BP 127/64 Comment: right arm sitting  Pulse 69   Temp 36.7 C (Oral)   Resp 18   Ht 5\' 5"  (1.651 m)   Wt 83 kg   SpO2 97%   BMI 30.45 kg/m   PROVIDERS: Tally Joe, MD is PCP    LABS: Labs reviewed: Acceptable for surgery. (all labs ordered are listed, but only abnormal results are displayed)  Labs Reviewed  BASIC METABOLIC PANEL - Abnormal; Notable for the following components:      Result Value   Creatinine, Ser 1.06 (*)    GFR, Estimated 55 (*)    All other components within normal limits  SURGICAL PCR SCREEN  CBC  TYPE AND SCREEN     IMAGES:   EKG:   CV: Coronary angiogram 11/02/2016: There was a 50-60 % stenosis in the secondary branch of OM 2, minimal disease in the diagonal and RV branch of RCA. FFR to OM 2 was negative for hemodynamic significance, 0.94.   Past Medical History:   Diagnosis Date   Anemia    hx of    Anginal pain (HCC)    ongoing for last 4-5 years (reported in 08/2014); had normal stress test 08/30/14 [facility] CV)   Cardiospasm    Chronic kidney disease    Headache    History of MRSA infection    2012 per patient on left ear    HTN (hypertension) 12/04/2018   Hypercholesterolemia    Hypoglycemia    Lumbar herniated disc    Osteoarthritis of spine with radiculopathy, lumbar region 04/01/2015   Pneumonia    hx of pneumonia    Prolapsed uterus     Past Surgical History:  Procedure Laterality Date   ABDOMINAL AORTOGRAM W/LOWER EXTREMITY N/A 02/12/2020   Procedure: ABDOMINAL AORTOGRAM W/LOWER EXTREMITY;  Surgeon: Yates Decamp, MD;  Location: [facility] INVASIVE CV LAB;  Service: Cardiovascular;  Laterality: N/A;   ANTERIOR HIP REVISION Left 09/09/2014   Procedure: LEFT TOTAL POSTERIOR HIP REVISION;  Surgeon: Durene Romans, MD;  Location: [facility];  Service: Orthopedics;  Laterality: Left;   CARDIAC CATHETERIZATION N/A 02/16/2016   Procedure: Left Heart Cath and Coronary Angiography;  Surgeon: Yates Decamp, MD;  Location: [facility] INVASIVE CV LAB;  Service: Cardiovascular;  Laterality: N/A;   CORONARY PRESSURE/FFR STUDY N/A 11/02/2016   Procedure: Intravascular Pressure Wire/FFR Study;  Surgeon: Yates Decamp, MD;  Location: [facility] INVASIVE CV LAB;  Service: Cardiovascular;  Laterality: N/A;   DILATION AND CURETTAGE OF UTERUS     HIP CLOSED REDUCTION Left 01/13/2018   Procedure: CLOSED MANIPULATION HIP;  Surgeon: Samson Frederic, MD;  Location: [facility];  Service: Orthopedics;  Laterality: Left;   HIP CLOSED REDUCTION Left 08/14/2022   Procedure: CLOSED MANIPULATION HIP;  Surgeon: Venita Lick, MD;  Location: [facility];  Service: Orthopedics;  Laterality: Left;   JOINT REPLACEMENT     left hip   LEFT HEART CATH AND CORONARY ANGIOGRAPHY N/A 11/02/2016   Procedure: Left Heart Cath and Coronary Angiography;  Surgeon: Yates Decamp, MD;  Location: [facility] INVASIVE CV LAB;  Service:  Cardiovascular;  Laterality: N/A;   left hip pinning      LOWER EXTREMITY ANGIOGRAPHY Bilateral 02/12/2020   Procedure: Bilateral LOWER EXTREMITY ANGIOGRAPHY;  Surgeon: Yates Decamp, MD;  Location: [facility] INVASIVE CV LAB;  Service: Cardiovascular;  Laterality: Bilateral;   LUMBAR LAMINECTOMY/DECOMPRESSION MICRODISCECTOMY Left 04/01/2015   Procedure: Left lumbar three to lumbar four microdiskectomy Right lumbar four to five discectomy redo;  Surgeon: Coletta Memos, MD;  Location: [facility] NE[facility];  Service: Neurosurgery;  Laterality: Left;  Left L34 microdiskectomy   TONSILLECTOMY      MEDICATIONS:  ALEVE 220 MG tablet   atorvastatin (LIPITOR) 40 MG tablet   benazepril-hydrochlorthiazide (LOTENSIN HCT) 20-25 MG tablet   Cyanocobalamin (B-12 SUPER STRENGTH) 5000 MCG/ML LIQD   diphenhydrAMINE (BENADRYL) 25 MG tablet   ezetimibe (ZETIA) 10 MG tablet   HYDROcodone-acetaminophen (NORCO/VICODIN) 5-325 MG tablet   levothyroxine (SYNTHROID, LEVOTHROID) 25 MCG tablet   Magnesium 250 MG TABS   Misc Natural Products (OSTEO BI-FLEX ADV TRIPLE ST) TABS   nitroGLYCERIN (NITROSTAT) 0.4 MG SL tablet   NON FORMULARY   No current facility-administered medications for this encounter.    Jodell Cipro Ward, PA-C [facility] Pre-Surgical Testing [phone]

## 2022-09-22 NOTE — H&P (Signed)
TOTAL HIP REVISION ADMISSION H&P  Patient is admitted for left revision total hip arthroplasty.  Subjective:  Chief Complaint: left hip pain, recurrent dislocation  HPI: Kendra Mcgrath, 72 y.o. female. She has a history of left total hip revision in May of 2016. She had a dislocation in 2019 with closed reduction, and then recently had a dislocation and had a closed reduction by Dr.Brooks on 08/15/22. Decision was made for hip revision to address instability.   Patient Active Problem List   Diagnosis Date Noted   Closed anterior dislocation of left hip (HCC) 08/15/2022   Claudication in peripheral vascular disease (HCC) 02/11/2020   HTN (hypertension) 12/04/2018   Angina pectoris (HCC) 10/31/2016   Osteoarthritis of spine with radiculopathy, lumbar region 04/01/2015   Overweight (BMI 25.0-29.9) 09/11/2014   S/P revision of left [facility] 09/09/2014   Past Medical History:  Diagnosis Date   Anemia    hx of    Anginal pain (HCC)    ongoing for last 4-5 years (reported in 08/2014); had normal stress test 08/30/14 [facility] CV)   Cardiospasm    Chronic kidney disease    Headache    History of MRSA infection    2012 per patient on left ear    HTN (hypertension) 12/04/2018   Hypercholesterolemia    Hypoglycemia    Lumbar herniated disc    Osteoarthritis of spine with radiculopathy, lumbar region 04/01/2015   Pneumonia    hx of pneumonia    Prolapsed uterus     Past Surgical History:  Procedure Laterality Date   ABDOMINAL AORTOGRAM W/LOWER EXTREMITY N/A 02/12/2020   Procedure: ABDOMINAL AORTOGRAM W/LOWER EXTREMITY;  Surgeon: Yates Decamp, MD;  Location: [facility] INVASIVE CV LAB;  Service: Cardiovascular;  Laterality: N/A;   ANTERIOR HIP REVISION Left 09/09/2014   Procedure: LEFT TOTAL POSTERIOR HIP REVISION;  Surgeon: Durene Romans, MD;  Location: [facility] ORS;  Service: Orthopedics;  Laterality: Left;   CARDIAC CATHETERIZATION N/A 02/16/2016   Procedure: Left Heart Cath and Coronary Angiography;   Surgeon: Yates Decamp, MD;  Location: [facility] INVASIVE CV LAB;  Service: Cardiovascular;  Laterality: N/A;   CORONARY PRESSURE/FFR STUDY N/A 11/02/2016   Procedure: Intravascular Pressure Wire/FFR Study;  Surgeon: Yates Decamp, MD;  Location: [facility] INVASIVE CV LAB;  Service: Cardiovascular;  Laterality: N/A;   DILATION AND CURETTAGE OF UTERUS     HIP CLOSED REDUCTION Left 01/13/2018   Procedure: CLOSED MANIPULATION HIP;  Surgeon: Samson Frederic, MD;  Location: [facility] ORS;  Service: Orthopedics;  Laterality: Left;   HIP CLOSED REDUCTION Left 08/14/2022   Procedure: CLOSED MANIPULATION HIP;  Surgeon: Venita Lick, MD;  Location: [facility] ORS;  Service: Orthopedics;  Laterality: Left;   JOINT REPLACEMENT     left hip   LEFT HEART CATH AND CORONARY ANGIOGRAPHY N/A 11/02/2016   Procedure: Left Heart Cath and Coronary Angiography;  Surgeon: Yates Decamp, MD;  Location: [facility] INVASIVE CV LAB;  Service: Cardiovascular;  Laterality: N/A;   left hip pinning      LOWER EXTREMITY ANGIOGRAPHY Bilateral 02/12/2020   Procedure: Bilateral LOWER EXTREMITY ANGIOGRAPHY;  Surgeon: Yates Decamp, MD;  Location: [facility] INVASIVE CV LAB;  Service: Cardiovascular;  Laterality: Bilateral;   LUMBAR LAMINECTOMY/DECOMPRESSION MICRODISCECTOMY Left 04/01/2015   Procedure: Left lumbar three to lumbar four microdiskectomy Right lumbar four to five discectomy redo;  Surgeon: Coletta Memos, MD;  Location: [facility] NEURO ORS;  Service: Neurosurgery;  Laterality: Left;  Left L34 microdiskectomy   TONSILLECTOMY      No current facility-administered medications for  this encounter.   Current Outpatient Medications  Medication Sig Dispense Refill Last Dose   ALEVE 220 MG tablet Take 220-440 mg by mouth 2 (two) times daily as needed (for headaches or pain).      atorvastatin (LIPITOR) 40 MG tablet *NEED OFFICE VISIT* TAKE 1 TABLET BY MOUTH EVERY DAY (Patient taking differently: Take 40 mg by mouth in the morning.) 90 tablet 0    benazepril-hydrochlorthiazide (LOTENSIN  HCT) 20-25 MG tablet TAKE 1 TABLET BY MOUTH EVERY DAY IN THE MORNING (Patient taking differently: Take 1 tablet by mouth in the morning.) 90 tablet 1    Cyanocobalamin (B-12 SUPER STRENGTH) 5000 MCG/ML LIQD Place 5,000 mcg under the tongue in the morning.      diphenhydrAMINE (BENADRYL) 25 MG tablet Take 25-75 mg by mouth at bedtime as needed for sleep.      levothyroxine (SYNTHROID, LEVOTHROID) 25 MCG tablet Take 25 mcg by mouth daily before breakfast.  5    Magnesium 250 MG TABS Take 250 mg by mouth in the morning.      Misc Natural Products (OSTEO BI-FLEX ADV TRIPLE ST) TABS Take 1 tablet by mouth in the morning.      nitroGLYCERIN (NITROSTAT) 0.4 MG SL tablet Place 1 tablet (0.4 mg total) under the tongue every 5 (five) minutes as needed for chest pain. 25 tablet 2    NON FORMULARY Take 1 tablet by mouth See admin instructions. Focus Factor tablets- Take 1 tablet by mouth in the morning      ezetimibe (ZETIA) 10 MG tablet Take 1 tablet (10 mg total) by mouth daily. 90 tablet 3    HYDROcodone-acetaminophen (NORCO/VICODIN) 5-325 MG tablet Take 1 tablet by mouth every 6 (six) hours as needed for moderate pain (pain score 4-6). 10 tablet 0 Not Taking   Allergies  Allergen Reactions   Penicillins Hives, Itching and Rash   Tetanus Toxoid Hives, Itching and Rash    Social History   Tobacco Use   Smoking status: Former    Packs/day: 0.50    Years: 50.00    Additional pack years: 0.00    Total pack years: 25.00    Types: Cigarettes   Smokeless tobacco: Never   Tobacco comments:    YEAR AGO  Substance Use Topics   Alcohol use: Yes    Comment: rare    Family History  Problem Relation Age of Onset   Hypertension Father    Cancer Other       Review of Systems  Constitutional:  Negative for chills and fever.  Respiratory:  Negative for cough and shortness of breath.   Cardiovascular:  Negative for chest pain.  Gastrointestinal:  Negative for nausea and vomiting.  Musculoskeletal:   Positive for arthralgias.     Objective:  Physical Exam Well nourished and well developed. General: Alert and oriented x3, cooperative and pleasant, no acute distress. Head: normocephalic, atraumatic, neck supple. Eyes: EOMI.  Musculoskeletal: Left Hip: Mild pain with passive hip ROM  Calves soft and nontender. Motor function intact in LE. Strength 5/5 LE bilaterally. Neuro: Distal pulses 2+. Sensation to light touch intact in LE.  Vital signs in last 24 hours:     Labs:   Estimated body mass index is 30.95 kg/m as calculated from the following:   Height as of 09/21/22: 5\' 5"  (1.651 m).   Weight as of 09/21/22: 84.4 kg.  Imaging Review:      Assessment/Plan:  Recurrent instability, left hip(s) with failed previous arthroplasty.  The patient history, physical examination, clinical judgement of the provider and imaging studies are consistent with end stage degenerative joint disease of the left hip(s), previous total hip arthroplasty. Revision total hip arthroplasty is deemed medically necessary. The treatment options including medical management, injection therapy, arthroscopy and arthroplasty were discussed at length. The risks and benefits of total hip arthroplasty were presented and reviewed. The risks due to aseptic loosening, infection, stiffness, dislocation/subluxation,  thromboembolic complications and other imponderables were discussed.  The patient acknowledged the explanation, agreed to proceed with the plan and consent was signed. Patient is being admitted for inpatient treatment for surgery, pain control, PT, OT, prophylactic antibiotics, VTE prophylaxis, progressive ambulation and ADL's and discharge planning. The patient is planning to be discharged  home.  Therapy Plans: HEP Disposition: Home with husband Planned DVT Prophylaxis: aspirin 81mg  BID DME needed: walker PCP: Cardio: Dr. Jacinto Halim, clearance received TXA: Allergies: PCN - hives, tetanus -  hives Anesthesia Concerns: none -- hx of multilevel fusions (prefers general) BMI: 30.3 Last HgbA1c: Not diabetic   Other: - No hx of VTE or cancer - oxycodone, robaxin, tylenol, celebrex  Rosalene Billings, PA-C Orthopedic Surgery [facility] [place] [id number]

## 2022-09-23 ENCOUNTER — Inpatient Hospital Stay (HOSPITAL_COMMUNITY): Payer: Medicare Other

## 2022-09-23 ENCOUNTER — Inpatient Hospital Stay (HOSPITAL_COMMUNITY): Payer: Medicare Other | Admitting: Physician Assistant

## 2022-09-23 ENCOUNTER — Encounter (HOSPITAL_COMMUNITY): Payer: Self-pay | Admitting: Orthopedic Surgery

## 2022-09-23 ENCOUNTER — Other Ambulatory Visit: Payer: Self-pay

## 2022-09-23 ENCOUNTER — Inpatient Hospital Stay (HOSPITAL_COMMUNITY)
Admission: RE | Admit: 2022-09-23 | Discharge: 2022-09-24 | DRG: 468 | Disposition: A | Discharge status: Home / Self Care | Payer: Medicare Other | Attending: Orthopedic Surgery | Admitting: Orthopedic Surgery

## 2022-09-23 ENCOUNTER — Inpatient Hospital Stay (HOSPITAL_COMMUNITY): Payer: Medicare Other | Admitting: Anesthesiology

## 2022-09-23 ENCOUNTER — Encounter (HOSPITAL_COMMUNITY)
Admission: RE | Disposition: A | Discharge status: Home / Self Care | Payer: Self-pay | Source: Home / Self Care | Attending: Orthopedic Surgery

## 2022-09-23 DIAGNOSIS — E669 Obesity, unspecified: Secondary | ICD-10-CM | POA: Diagnosis present

## 2022-09-23 DIAGNOSIS — Z79899 Other long term (current) drug therapy: Secondary | ICD-10-CM | POA: Diagnosis not present

## 2022-09-23 DIAGNOSIS — E7800 Pure hypercholesterolemia, unspecified: Secondary | ICD-10-CM | POA: Diagnosis present

## 2022-09-23 DIAGNOSIS — I10 Essential (primary) hypertension: Secondary | ICD-10-CM

## 2022-09-23 DIAGNOSIS — Z471 Aftercare following joint replacement surgery: Secondary | ICD-10-CM | POA: Diagnosis not present

## 2022-09-23 DIAGNOSIS — M25352 Other instability, left hip: Secondary | ICD-10-CM | POA: Diagnosis present

## 2022-09-23 DIAGNOSIS — T84091A Other mechanical complication of internal left hip prosthesis, initial encounter: Secondary | ICD-10-CM | POA: Diagnosis present

## 2022-09-23 DIAGNOSIS — E039 Hypothyroidism, unspecified: Secondary | ICD-10-CM | POA: Diagnosis not present

## 2022-09-23 DIAGNOSIS — Z8249 Family history of ischemic heart disease and other diseases of the circulatory system: Secondary | ICD-10-CM

## 2022-09-23 DIAGNOSIS — I739 Peripheral vascular disease, unspecified: Secondary | ICD-10-CM | POA: Diagnosis present

## 2022-09-23 DIAGNOSIS — T84021A Dislocation of internal left hip prosthesis, initial encounter: Principal | ICD-10-CM | POA: Diagnosis present

## 2022-09-23 DIAGNOSIS — Z87891 Personal history of nicotine dependence: Secondary | ICD-10-CM | POA: Diagnosis not present

## 2022-09-23 DIAGNOSIS — Z880 Allergy status to penicillin: Secondary | ICD-10-CM

## 2022-09-23 DIAGNOSIS — Z887 Allergy status to serum and vaccine status: Secondary | ICD-10-CM

## 2022-09-23 DIAGNOSIS — Y792 Prosthetic and other implants, materials and accessory orthopedic devices associated with adverse incidents: Secondary | ICD-10-CM | POA: Diagnosis present

## 2022-09-23 DIAGNOSIS — Z6830 Body mass index (BMI) 30.0-30.9, adult: Secondary | ICD-10-CM | POA: Diagnosis not present

## 2022-09-23 DIAGNOSIS — Z8701 Personal history of pneumonia (recurrent): Secondary | ICD-10-CM

## 2022-09-23 DIAGNOSIS — M1612 Unilateral primary osteoarthritis, left hip: Secondary | ICD-10-CM | POA: Diagnosis present

## 2022-09-23 DIAGNOSIS — Z79890 Hormone replacement therapy (postmenopausal): Secondary | ICD-10-CM

## 2022-09-23 DIAGNOSIS — Z96649 Presence of unspecified artificial hip joint: Secondary | ICD-10-CM

## 2022-09-23 DIAGNOSIS — S73035D Other anterior dislocation of left hip, subsequent encounter: Principal | ICD-10-CM

## 2022-09-23 DIAGNOSIS — Z96642 Presence of left artificial hip joint: Secondary | ICD-10-CM | POA: Diagnosis not present

## 2022-09-23 DIAGNOSIS — Z8614 Personal history of Methicillin resistant Staphylococcus aureus infection: Secondary | ICD-10-CM | POA: Diagnosis not present

## 2022-09-23 HISTORY — PX: TOTAL HIP REVISION: SHX763

## 2022-09-23 LAB — TYPE AND SCREEN
ABO/RH(D): O POS
Antibody Screen: NEGATIVE

## 2022-09-23 SURGERY — TOTAL HIP REVISION
Anesthesia: General | Site: Hip | Laterality: Left

## 2022-09-23 MED ORDER — ONDANSETRON HCL 4 MG/2ML IJ SOLN
INTRAMUSCULAR | Status: AC
  Filled 2022-09-23: qty 2

## 2022-09-23 MED ORDER — ACETAMINOPHEN 500 MG PO TABS
1000.0000 mg | ORAL_TABLET | Freq: Once | ORAL | Status: AC
  Administered 2022-09-23: 1000 mg via ORAL
  Filled 2022-09-23: qty 2

## 2022-09-23 MED ORDER — MIDAZOLAM HCL 5 MG/5ML IJ SOLN
INTRAMUSCULAR | Status: DC | PRN
  Administered 2022-09-23: 2 mg via INTRAVENOUS

## 2022-09-23 MED ORDER — OXYCODONE HCL 5 MG PO TABS
ORAL_TABLET | ORAL | Status: AC
  Filled 2022-09-23: qty 1

## 2022-09-23 MED ORDER — EPHEDRINE SULFATE (PRESSORS) 50 MG/ML IJ SOLN
INTRAMUSCULAR | Status: DC | PRN
  Administered 2022-09-23: 5 mg via INTRAVENOUS
  Administered 2022-09-23 (×2): 10 mg via INTRAVENOUS

## 2022-09-23 MED ORDER — LIDOCAINE HCL (PF) 2 % IJ SOLN
INTRAMUSCULAR | Status: AC
  Filled 2022-09-23: qty 5

## 2022-09-23 MED ORDER — FENTANYL CITRATE (PF) 100 MCG/2ML IJ SOLN
INTRAMUSCULAR | Status: DC | PRN
  Administered 2022-09-23 (×2): 50 ug via INTRAVENOUS

## 2022-09-23 MED ORDER — BISACODYL 10 MG RE SUPP: 10.0000 mg | Freq: Every day | RECTAL | Status: DC | PRN

## 2022-09-23 MED ORDER — EZETIMIBE 10 MG PO TABS
10.0000 mg | ORAL_TABLET | Freq: Every day | ORAL | Status: DC
  Administered 2022-09-24: 10 mg via ORAL
  Filled 2022-09-23: qty 1

## 2022-09-23 MED ORDER — DEXAMETHASONE SODIUM PHOSPHATE 10 MG/ML IJ SOLN
10.0000 mg | Freq: Once | INTRAMUSCULAR | Status: AC
  Administered 2022-09-24: 10 mg via INTRAVENOUS
  Filled 2022-09-23: qty 1

## 2022-09-23 MED ORDER — CHLORHEXIDINE GLUCONATE 0.12 % MT SOLN
15.0000 mL | Freq: Once | OROMUCOSAL | Status: AC
  Administered 2022-09-23: 15 mL via OROMUCOSAL

## 2022-09-23 MED ORDER — MENTHOL 3 MG MT LOZG: 1.0000 | LOZENGE | OROMUCOSAL | Status: DC | PRN

## 2022-09-23 MED ORDER — TRANEXAMIC ACID-NACL 1000-0.7 MG/100ML-% IV SOLN
1000.0000 mg | INTRAVENOUS | Status: AC
  Administered 2022-09-23: 1000 mg via INTRAVENOUS
  Filled 2022-09-23: qty 100

## 2022-09-23 MED ORDER — FENTANYL CITRATE PF 50 MCG/ML IJ SOSY
PREFILLED_SYRINGE | INTRAMUSCULAR | Status: AC
  Filled 2022-09-23: qty 1

## 2022-09-23 MED ORDER — METHOCARBAMOL 500 MG IVPB - SIMPLE MED
INTRAVENOUS | Status: AC
  Filled 2022-09-23: qty 55

## 2022-09-23 MED ORDER — ONDANSETRON HCL 4 MG PO TABS: 4.0000 mg | ORAL_TABLET | Freq: Four times a day (QID) | ORAL | Status: DC | PRN

## 2022-09-23 MED ORDER — DEXAMETHASONE SODIUM PHOSPHATE 10 MG/ML IJ SOLN
8.0000 mg | Freq: Once | INTRAMUSCULAR | Status: AC
  Administered 2022-09-23: 8 mg via INTRAVENOUS

## 2022-09-23 MED ORDER — HYDROCHLOROTHIAZIDE 25 MG PO TABS
25.0000 mg | ORAL_TABLET | Freq: Every day | ORAL | Status: DC
  Administered 2022-09-24: 25 mg via ORAL
  Filled 2022-09-23: qty 1

## 2022-09-23 MED ORDER — OXYCODONE HCL 5 MG PO TABS
5.0000 mg | ORAL_TABLET | ORAL | Status: DC | PRN
  Administered 2022-09-23: 5 mg via ORAL
  Administered 2022-09-23: 10 mg via ORAL
  Filled 2022-09-23 (×2): qty 2

## 2022-09-23 MED ORDER — SUGAMMADEX SODIUM 200 MG/2ML IV SOLN
INTRAVENOUS | Status: DC | PRN
  Administered 2022-09-23: 200 mg via INTRAVENOUS

## 2022-09-23 MED ORDER — TRANEXAMIC ACID-NACL 1000-0.7 MG/100ML-% IV SOLN
1000.0000 mg | Freq: Once | INTRAVENOUS | Status: AC
  Administered 2022-09-23: 1000 mg via INTRAVENOUS
  Filled 2022-09-23: qty 100

## 2022-09-23 MED ORDER — HYDROMORPHONE HCL 2 MG/ML IJ SOLN
INTRAMUSCULAR | Status: AC
  Filled 2022-09-23: qty 1

## 2022-09-23 MED ORDER — PROMETHAZINE HCL 25 MG/ML IJ SOLN: 6.2500 mg | INTRAMUSCULAR | Status: DC | PRN

## 2022-09-23 MED ORDER — LEVOTHYROXINE SODIUM 25 MCG PO TABS
25.0000 ug | ORAL_TABLET | Freq: Every day | ORAL | Status: DC
  Administered 2022-09-24: 25 ug via ORAL
  Filled 2022-09-23: qty 1

## 2022-09-23 MED ORDER — NITROGLYCERIN 0.4 MG SL SUBL: 0.4000 mg | SUBLINGUAL_TABLET | SUBLINGUAL | Status: DC | PRN

## 2022-09-23 MED ORDER — BENAZEPRIL HCL 20 MG PO TABS
20.0000 mg | ORAL_TABLET | Freq: Every day | ORAL | Status: DC
  Administered 2022-09-24: 20 mg via ORAL
  Filled 2022-09-23: qty 1

## 2022-09-23 MED ORDER — FENTANYL CITRATE PF 50 MCG/ML IJ SOSY
25.0000 ug | PREFILLED_SYRINGE | INTRAMUSCULAR | Status: DC | PRN
  Administered 2022-09-23 (×3): 50 ug via INTRAVENOUS

## 2022-09-23 MED ORDER — PHENYLEPHRINE HCL-NACL 20-0.9 MG/250ML-% IV SOLN: INTRAVENOUS | Status: DC | PRN

## 2022-09-23 MED ORDER — CEFAZOLIN SODIUM-DEXTROSE 2-4 GM/100ML-% IV SOLN
2.0000 g | Freq: Four times a day (QID) | INTRAVENOUS | Status: AC
  Administered 2022-09-23 (×2): 2 g via INTRAVENOUS
  Filled 2022-09-23 (×2): qty 100

## 2022-09-23 MED ORDER — POLYETHYLENE GLYCOL 3350 17 G PO PACK
17.0000 g | PACK | Freq: Two times a day (BID) | ORAL | Status: DC
  Filled 2022-09-23 (×3): qty 1

## 2022-09-23 MED ORDER — FENTANYL CITRATE PF 50 MCG/ML IJ SOSY
PREFILLED_SYRINGE | INTRAMUSCULAR | Status: AC
  Filled 2022-09-23: qty 2

## 2022-09-23 MED ORDER — CEFAZOLIN SODIUM-DEXTROSE 2-4 GM/100ML-% IV SOLN
INTRAVENOUS | Status: AC
  Filled 2022-09-23: qty 100

## 2022-09-23 MED ORDER — PHENYLEPHRINE 80 MCG/ML (10ML) SYRINGE FOR IV PUSH (FOR BLOOD PRESSURE SUPPORT)
PREFILLED_SYRINGE | INTRAVENOUS | Status: AC
  Filled 2022-09-23: qty 10

## 2022-09-23 MED ORDER — DOCUSATE SODIUM 100 MG PO CAPS
100.0000 mg | ORAL_CAPSULE | Freq: Two times a day (BID) | ORAL | Status: DC
  Filled 2022-09-23 (×3): qty 1

## 2022-09-23 MED ORDER — PROPOFOL 10 MG/ML IV BOLUS
INTRAVENOUS | Status: DC | PRN
  Administered 2022-09-23: 130 mg via INTRAVENOUS

## 2022-09-23 MED ORDER — ROCURONIUM BROMIDE 100 MG/10ML IV SOLN
INTRAVENOUS | Status: DC | PRN
  Administered 2022-09-23: 80 mg via INTRAVENOUS

## 2022-09-23 MED ORDER — MIDAZOLAM HCL 2 MG/2ML IJ SOLN
INTRAMUSCULAR | Status: AC
  Filled 2022-09-23: qty 2

## 2022-09-23 MED ORDER — FENTANYL CITRATE (PF) 100 MCG/2ML IJ SOLN
INTRAMUSCULAR | Status: AC
  Filled 2022-09-23: qty 2

## 2022-09-23 MED ORDER — ONDANSETRON HCL 4 MG/2ML IJ SOLN: 4.0000 mg | Freq: Four times a day (QID) | INTRAMUSCULAR | Status: DC | PRN

## 2022-09-23 MED ORDER — PROPOFOL 1000 MG/100ML IV EMUL
INTRAVENOUS | Status: AC
  Filled 2022-09-23: qty 100

## 2022-09-23 MED ORDER — LIDOCAINE HCL (PF) 2 % IJ SOLN
INTRAMUSCULAR | Status: DC | PRN
  Administered 2022-09-23: 60 mg via INTRADERMAL

## 2022-09-23 MED ORDER — ASPIRIN 81 MG PO CHEW
81.0000 mg | CHEWABLE_TABLET | Freq: Two times a day (BID) | ORAL | Status: DC
  Administered 2022-09-23 – 2022-09-24 (×2): 81 mg via ORAL
  Filled 2022-09-23 (×2): qty 1

## 2022-09-23 MED ORDER — METOCLOPRAMIDE HCL 5 MG PO TABS: 5.0000 mg | ORAL_TABLET | Freq: Three times a day (TID) | ORAL | Status: DC | PRN

## 2022-09-23 MED ORDER — METHOCARBAMOL 500 MG PO TABS
500.0000 mg | ORAL_TABLET | Freq: Four times a day (QID) | ORAL | Status: DC | PRN
  Administered 2022-09-23: 500 mg via ORAL
  Filled 2022-09-23: qty 1

## 2022-09-23 MED ORDER — PHENOL 1.4 % MT LIQD: 1.0000 | OROMUCOSAL | Status: DC | PRN

## 2022-09-23 MED ORDER — ORAL CARE MOUTH RINSE: 15.0000 mL | OROMUCOSAL | Status: DC | PRN

## 2022-09-23 MED ORDER — OXYCODONE HCL 5 MG PO TABS
10.0000 mg | ORAL_TABLET | ORAL | Status: DC | PRN
  Administered 2022-09-24 (×2): 10 mg via ORAL
  Filled 2022-09-23: qty 2

## 2022-09-23 MED ORDER — METOCLOPRAMIDE HCL 5 MG/ML IJ SOLN: 5.0000 mg | Freq: Three times a day (TID) | INTRAMUSCULAR | Status: DC | PRN

## 2022-09-23 MED ORDER — ATORVASTATIN CALCIUM 40 MG PO TABS
40.0000 mg | ORAL_TABLET | Freq: Every day | ORAL | Status: DC
  Administered 2022-09-24: 40 mg via ORAL
  Filled 2022-09-23: qty 1

## 2022-09-23 MED ORDER — DIPHENHYDRAMINE HCL 12.5 MG/5ML PO ELIX
12.5000 mg | ORAL_SOLUTION | ORAL | Status: DC | PRN
  Administered 2022-09-23: 25 mg via ORAL
  Filled 2022-09-23: qty 10

## 2022-09-23 MED ORDER — DEXAMETHASONE SODIUM PHOSPHATE 10 MG/ML IJ SOLN
INTRAMUSCULAR | Status: AC
  Filled 2022-09-23: qty 1

## 2022-09-23 MED ORDER — SODIUM CHLORIDE 0.9 % IV SOLN: INTRAVENOUS | Status: DC

## 2022-09-23 MED ORDER — HYDROMORPHONE HCL 1 MG/ML IJ SOLN: 0.5000 mg | INTRAMUSCULAR | Status: DC | PRN

## 2022-09-23 MED ORDER — LACTATED RINGERS IV SOLN: INTRAVENOUS | Status: DC

## 2022-09-23 MED ORDER — AMISULPRIDE (ANTIEMETIC) 5 MG/2ML IV SOLN: 10.0000 mg | Freq: Once | INTRAVENOUS | Status: DC | PRN

## 2022-09-23 MED ORDER — STERILE WATER FOR IRRIGATION IR SOLN
Status: DC | PRN
  Administered 2022-09-23: 2000 mL

## 2022-09-23 MED ORDER — METHOCARBAMOL 500 MG IVPB - SIMPLE MED
500.0000 mg | Freq: Four times a day (QID) | INTRAVENOUS | Status: DC | PRN
  Administered 2022-09-23: 500 mg via INTRAVENOUS
  Filled 2022-09-23: qty 55

## 2022-09-23 MED ORDER — PHENYLEPHRINE 80 MCG/ML (10ML) SYRINGE FOR IV PUSH (FOR BLOOD PRESSURE SUPPORT)
PREFILLED_SYRINGE | INTRAVENOUS | Status: DC | PRN
  Administered 2022-09-23: 160 ug via INTRAVENOUS

## 2022-09-23 MED ORDER — ACETAMINOPHEN 500 MG PO TABS
1000.0000 mg | ORAL_TABLET | Freq: Four times a day (QID) | ORAL | Status: DC
  Administered 2022-09-23 – 2022-09-24 (×3): 1000 mg via ORAL
  Filled 2022-09-23 (×3): qty 2

## 2022-09-23 MED ORDER — ORAL CARE MOUTH RINSE: 15.0000 mL | Freq: Once | OROMUCOSAL | Status: AC

## 2022-09-23 MED ORDER — 0.9 % SODIUM CHLORIDE (POUR BTL) OPTIME
TOPICAL | Status: DC | PRN
  Administered 2022-09-23: 1000 mL

## 2022-09-23 MED ORDER — BENAZEPRIL-HYDROCHLOROTHIAZIDE 20-25 MG PO TABS: 1.0000 | ORAL_TABLET | Freq: Every morning | ORAL | Status: DC

## 2022-09-23 MED ORDER — ROCURONIUM BROMIDE 10 MG/ML (PF) SYRINGE
PREFILLED_SYRINGE | INTRAVENOUS | Status: AC
  Filled 2022-09-23: qty 10

## 2022-09-23 MED ORDER — SODIUM CHLORIDE 0.9 % IR SOLN
Status: DC | PRN
  Administered 2022-09-23: 1000 mL

## 2022-09-23 MED ORDER — POVIDONE-IODINE 10 % EX SWAB: 2.0000 | Freq: Once | CUTANEOUS | Status: DC

## 2022-09-23 MED ORDER — CEFAZOLIN SODIUM-DEXTROSE 2-4 GM/100ML-% IV SOLN
2.0000 g | INTRAVENOUS | Status: AC
  Administered 2022-09-23: 2 g via INTRAVENOUS

## 2022-09-23 SURGICAL SUPPLY — 45 items
ADH SKN CLS APL DERMABOND .7 (GAUZE/BANDAGES/DRESSINGS) ×1
BAG COUNTER SPONGE SURGICOUNT (BAG) IMPLANT
BAG SPNG CNTER NS LX DISP (BAG) ×1
BLADE SAW SGTL 81X20 HD (BLADE) ×1 IMPLANT
COVER SURGICAL LIGHT HANDLE (MISCELLANEOUS) ×1 IMPLANT
DERMABOND ADVANCED .7 DNX12 (GAUZE/BANDAGES/DRESSINGS) ×1 IMPLANT
DRAPE INCISE IOBAN 85X60 (DRAPES) ×1 IMPLANT
DRAPE ORTHO SPLIT 77X108 STRL (DRAPES) ×2
DRAPE POUCH INSTRU U-SHP 10X18 (DRAPES) ×1 IMPLANT
DRAPE SURG 17X11 SM STRL (DRAPES) ×1 IMPLANT
DRAPE SURG ORHT 6 SPLT 77X108 (DRAPES) ×2 IMPLANT
DRAPE U-SHAPE 47X51 STRL (DRAPES) ×1 IMPLANT
DRSG AQUACEL AG ADV 3.5X14 (GAUZE/BANDAGES/DRESSINGS) IMPLANT
DURAPREP 26ML APPLICATOR (WOUND CARE) ×1 IMPLANT
ELECT BLADE TIP CTD 4 INCH (ELECTRODE) ×1 IMPLANT
ELECT REM PT RETURN 15FT ADLT (MISCELLANEOUS) ×1 IMPLANT
FACESHIELD WRAPAROUND (MASK) ×4 IMPLANT
FACESHIELD WRAPAROUND OR TEAM (MASK) ×4 IMPLANT
GLOVE BIO SURGEON STRL SZ 6 (GLOVE) ×1 IMPLANT
GLOVE BIOGEL PI IND STRL 6.5 (GLOVE) ×1 IMPLANT
GLOVE BIOGEL PI IND STRL 7.5 (GLOVE) ×1 IMPLANT
GLOVE ORTHO TXT STRL SZ7.5 (GLOVE) ×2 IMPLANT
GOWN STRL REUS W/ TWL LRG LVL3 (GOWN DISPOSABLE) ×3 IMPLANT
GOWN STRL REUS W/TWL LRG LVL3 (GOWN DISPOSABLE) ×3
HANDPIECE INTERPULSE COAX TIP (DISPOSABLE) ×1
HEAD DELTA CERAMIC 32 P9 12/14 (Hips) IMPLANT
KIT BASIN OR (CUSTOM PROCEDURE TRAY) ×1 IMPLANT
KIT TURNOVER KIT A (KITS) IMPLANT
LINER PINNACLE (Hips) IMPLANT
MANIFOLD NEPTUNE II (INSTRUMENTS) ×1 IMPLANT
NDL SAFETY ECLIP 18X1.5 (MISCELLANEOUS) ×1 IMPLANT
NS IRRIG 1000ML POUR BTL (IV SOLUTION) ×1 IMPLANT
PACK TOTAL JOINT (CUSTOM PROCEDURE TRAY) ×1 IMPLANT
PROTECTOR NERVE ULNAR (MISCELLANEOUS) ×1 IMPLANT
SET HNDPC FAN SPRY TIP SCT (DISPOSABLE) ×1 IMPLANT
SLEEVE SUCTION 125 (MISCELLANEOUS) ×1 IMPLANT
SUCTION FRAZIER HANDLE 12FR (TUBING) ×1
SUCTION TUBE FRAZIER 12FR DISP (TUBING) ×1 IMPLANT
SUT STRATAFIX PDS+ 0 24IN (SUTURE) ×1 IMPLANT
SUT VIC AB 1 CT1 36 (SUTURE) ×1 IMPLANT
SUT VIC AB 2-0 CT1 27 (SUTURE) ×2
SUT VIC AB 2-0 CT1 TAPERPNT 27 (SUTURE) ×2 IMPLANT
TOWEL OR 17X26 10 PK STRL BLUE (TOWEL DISPOSABLE) ×2 IMPLANT
TUBE SUCTION HIGH CAP CLEAR NV (SUCTIONS) ×1 IMPLANT
WATER STERILE IRR 1000ML POUR (IV SOLUTION) ×2 IMPLANT

## 2022-09-23 NOTE — Anesthesia Procedure Notes (Signed)
Procedure Name: Intubation Date/Time: 09/23/2022 10:38 AM  Performed by: Johnette Abraham, CRNAPre-anesthesia Checklist: Patient identified, Emergency Drugs available, Suction available and Patient being monitored Patient Re-evaluated:Patient Re-evaluated prior to induction Oxygen Delivery Method: Circle System Utilized Preoxygenation: Pre-oxygenation with 100% oxygen Induction Type: IV induction Ventilation: Mask ventilation without difficulty Laryngoscope Size: Mac and 3 Grade View: Grade I Tube type: Oral Tube size: 7.0 mm Number of attempts: 1 Airway Equipment and Method: Stylet and Oral airway Placement Confirmation: ETT inserted through vocal cords under direct vision, positive ETCO2 and breath sounds checked- equal and bilateral Secured at: 22 cm Tube secured with: Tape Dental Injury: Teeth and Oropharynx as per pre-operative assessment

## 2022-09-23 NOTE — Anesthesia Postprocedure Evaluation (Signed)
Anesthesia Post Note  Patient: Kendra Mcgrath  Procedure(s) Performed: Revision left total hip arthroplasty to constrained liner (Left: Hip)     Patient location during evaluation: PACU Anesthesia Type: General Level of consciousness: sedated Pain management: pain level controlled Vital Signs Assessment: post-procedure vital signs reviewed and stable Respiratory status: spontaneous breathing and respiratory function stable Cardiovascular status: stable Postop Assessment: no apparent nausea or vomiting Anesthetic complications: no  No notable events documented.  Last Vitals:  Vitals:   09/23/22 1315 09/23/22 1333  BP: (!) 144/59 130/71  Pulse: 72 63  Resp: 14 16  Temp:    SpO2: 100% 94%    Last Pain:  Vitals:   09/23/22 1315  TempSrc:   PainSc: 4                  Melita Villalona DANIEL

## 2022-09-23 NOTE — Discharge Instructions (Signed)

## 2022-09-23 NOTE — Transfer of Care (Signed)
Immediate Anesthesia Transfer of Care Note  Patient: Kendra Mcgrath  Procedure(s) Performed: Revision left total hip arthroplasty to constrained liner (Left: Hip)  Patient Location: PACU  Anesthesia Type:General  Level of Consciousness: sedated  Airway & Oxygen Therapy: Patient Spontanous Breathing and Patient connected to face mask oxygen  Post-op Assessment: Report given to RN and Post -op Vital signs reviewed and stable  Post vital signs: Reviewed and stable  Last Vitals:  Vitals Value Taken Time  BP 175/96 09/23/22 1204  Temp    Pulse 98 09/23/22 1205  Resp 19 09/23/22 1205  SpO2 100 % 09/23/22 1205  Vitals shown include unvalidated device data.  Last Pain:  Vitals:   09/23/22 0823  TempSrc: Oral  PainSc:          Complications: No notable events documented.

## 2022-09-23 NOTE — Brief Op Note (Signed)
09/23/2022  12:42 PM  PATIENT:  Kendra Mcgrath  72 y.o. 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   Delay start of Pharmacological VTE agent (>24hrs) due to surgical blood loss or risk of bleeding: no

## 2022-09-23 NOTE — Interval H&P Note (Signed)
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  The various methods of treatment have been discussed with the patient and family. After consideration of risks, benefits and other options for treatment, the patient has consented to  Procedure(s): Revision left total hip arthroplasty to constrained liner versus revision acetabulum (Left) as a surgical intervention.  The patient's history has been reviewed, patient examined, no change in status, stable for surgery.  I have reviewed the patient's chart and labs.  Questions were answered to the patient's satisfaction.     Shelda Pal

## 2022-09-23 NOTE — Op Note (Signed)
Kendra Mcgrath, HULTS MEDICAL RECORD NO: [id number] ACCOUNT NO: [id number] DATE OF BIRTH: 1950/07/10 FACILITY: [facility]ATION[facility] PHYSICIAN: Madlyn Frankel. 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  PROCEDURE:  Revision left total hip arthroplasty to a constrained liner.  COMPONENTS USED:  DePuy constrained liner system with a 52 x 32+4 neutral constrained liner with a 32+9 titanium sleeve insert delta ceramic ball.  SURGEON:  Madlyn Frankel. [name], MD  ASSISTANT:  Rosalene Billings, PA-C.  Note that Ms. Kendra Mcgrath was present for the entirety of the case from preoperative positioning, perioperative management of the operative extremity, general facilitation of the case and primary wound closure.  ANESTHESIA:  General.  BLOOD LOSS:  100 mL  DRAINS:  None.  COMPLICATIONS:  None.  INDICATIONS FOR THE PROCEDURE:  The patient is a 72 year old female with a remote history of previous left femoral neck fracture treated with percutaneous cannulated screw fixation with subsequent failure and conversion to total hip arthroplasty with  ceramic-on-metal bearing surface.  This failed due to development of metalosis, which very well could have been related to trunnion wear on her acetabular shell based unidentified factors at that time.  She presented recently with two times of  dislocation over a period of 3-4 years.  When she was seen in the office she at this point, declared that she did not have to go through that again and wanted to have her hip revised to a constrained liner.  We reviewed the chances of persistent  instability with even a constrained liner.  We discussed the possible need for future surgeries.  We discussed and reviewed the standard risk for infection and DVT as  being fairly minimal.  Consent was obtained for management of her left hip instability.  DESCRIPTION OF PROCEDURE:  The patient was brought to the operative theater.  Once adequate anesthesia, preoperative antibiotics, Ancef administered as well as tranexamic acid and Decadron.  She was positioned into the right lateral decubitus position  with the left hip up.  The left hip was prepped and draped in sterile fashion.  Timeout was performed identifying the patient, planned procedure, and extremity.  Her old incision was identified and a portion of it was utilized.  Her skin was excised and  soft tissue planes created.  I then opened up the posterior aspect of the hip and identified clear synovial fluid with evidence of inflammation, but no signs of infection.  Following initial exposure and debridement of the posterior aspect of the hip,  the hip was dislocated.  The femoral head was removed.  There was again identified damage to the posterior aspect of her trunnion.  I had revise her acetabular shell in the past and this was stable and appeared to be in an adequate anatomic position to  prevent this at this point.  There was, however, noted to be some impingement with where the neck met her acetabular liner.  I removed the old acetabular liner.  We then debrided circumferentially around the cup to make certain it was fully visualized.   At this point, we opened up the 52 x 32+4 neutral constrained liner it was then impacted into position with good rim fit.  We then opened up the 32+9 titanium sleeve delta ceramic ball and impacted on a clean and dry trunnion.  Once this was done, the  bevel of the constrained liner ring, which had been placed over the trunnion prior to placement head ball was placed down over the polyethylene liner and snapped into position.  The position of it was confirmed in its correct location.  At this point, we  irrigated the hip with about 500 mL of normal saline solution with pulse  lavage.  We then closed the wound in layers using #1 Vicryl and Stratafix suture on the iliotibial band and gluteal fascia.  The remainder of wound was closed with 2-0 Vicryl and a  running Monocryl stitch.  The wound was clean, dry and dressed sterilely with surgical glue and Aquacel dressing.  The patient was brought to the recovery room in stable condition, tolerating the procedure well.   PUS D: 09/23/2022 12:41:03 pm T: 09/23/2022 4:23:00 pm  JOB: [id number]/ [id number]

## 2022-09-24 ENCOUNTER — Encounter (HOSPITAL_COMMUNITY): Payer: Self-pay | Admitting: Orthopedic Surgery

## 2022-09-24 LAB — CBC
HCT: 35.3 % — ABNORMAL LOW (ref 36.0–46.0)
Hemoglobin: 11.7 g/dL — ABNORMAL LOW (ref 12.0–15.0)
MCH: 28.7 pg (ref 26.0–34.0)
MCHC: 33.1 g/dL (ref 30.0–36.0)
MCV: 86.5 fL (ref 80.0–100.0)
Platelets: 218 10*3/uL (ref 150–400)
RBC: 4.08 MIL/uL (ref 3.87–5.11)
RDW: 13.4 % (ref 11.5–15.5)
WBC: 12.5 10*3/uL — ABNORMAL HIGH (ref 4.0–10.5)
nRBC: 0 % (ref 0.0–0.2)

## 2022-09-24 LAB — BASIC METABOLIC PANEL
Anion gap: 8 (ref 5–15)
BUN: 15 mg/dL (ref 8–23)
CO2: 24 mmol/L (ref 22–32)
Calcium: 8.9 mg/dL (ref 8.9–10.3)
Chloride: 104 mmol/L (ref 98–111)
Creatinine, Ser: 0.87 mg/dL (ref 0.44–1.00)
GFR, Estimated: >60 mL/min (ref >60)
Glucose, Bld: 136 mg/dL — ABNORMAL HIGH (ref 70–99)
Potassium: 3.8 mmol/L (ref 3.5–5.1)
Sodium: 136 mmol/L (ref 135–145)

## 2022-09-24 MED ORDER — CEFADROXIL 500 MG PO CAPS: 500.0000 mg | ORAL_CAPSULE | Freq: Two times a day (BID) | ORAL | 0 refills | Status: AC

## 2022-09-24 MED ORDER — ONDANSETRON HCL 4 MG PO TABS: 4.0000 mg | ORAL_TABLET | Freq: Four times a day (QID) | ORAL | 0 refills | Status: AC | PRN

## 2022-09-24 MED ORDER — POLYETHYLENE GLYCOL 3350 17 G PO PACK: 17.0000 g | PACK | Freq: Two times a day (BID) | ORAL | 0 refills | Status: AC

## 2022-09-24 MED ORDER — ASPIRIN 81 MG PO CHEW: 81.0000 mg | CHEWABLE_TABLET | Freq: Two times a day (BID) | ORAL | 0 refills | Status: AC

## 2022-09-24 MED ORDER — OXYCODONE HCL 5 MG PO TABS: 5.0000 mg | ORAL_TABLET | ORAL | 0 refills | Status: AC | PRN

## 2022-09-24 MED ORDER — METHOCARBAMOL 500 MG PO TABS: 500.0000 mg | ORAL_TABLET | Freq: Four times a day (QID) | ORAL | 2 refills | Status: AC | PRN

## 2022-09-24 MED ORDER — CELECOXIB 200 MG PO CAPS: 200.0000 mg | ORAL_CAPSULE | Freq: Two times a day (BID) | ORAL | 0 refills | Status: AC

## 2022-09-24 NOTE — Progress Notes (Signed)
   Subjective: 1 Day Post-Op Procedure(s) (LRB): Revision left total hip arthroplasty to constrained liner (Left) Patient reports pain as mild.   Patient seen in rounds with Dr. [name]. Patient is resting in bed on exam this morning. No acute events overnight.  Patient has not been up with PT yet. She is very motivated to get home early this morning. We will start therapy today.   Objective: Vital signs in last 24 hours: Temp:  [97.6 F (36.4 C)-98 F (36.7 C)] 98 F (36.7 C) (05/31 0540) Pulse Rate:  [63-98] 73 (05/31 0540) Resp:  [11-18] 18 (05/31 0540) BP: (108-175)/(49-107) 114/60 (05/31 0540) SpO2:  [94 %-100 %] 94 % (05/31 0540) Weight:  [84.4 kg] 84.4 kg (05/30 0821)  Intake/Output from previous day:  Intake/Output Summary (Last 24 hours) at 09/24/2022 0801 Last data filed at 09/24/2022 0729 Gross per 24 hour  Intake 3953.74 ml  Output 1800 ml  Net 2153.74 ml     Intake/Output this shift: Total I/O In: -  Out: 100 [Urine:100]  Labs: Recent Labs    09/24/22 0338  HGB 11.7*   Recent Labs    09/24/22 0338  WBC 12.5*  RBC 4.08  HCT 35.3*  PLT 218   Recent Labs    09/24/22 0338  NA 136  K 3.8  CL 104  CO2 24  BUN 15  CREATININE 0.87  GLUCOSE 136*  CALCIUM 8.9   No results for input(s): "LABPT", "INR" in the last 72 hours.  Exam: General - Patient is Alert and Oriented Extremity - Neurologically intact Sensation intact distally Intact pulses distally Dorsiflexion/Plantar flexion intact Dressing - dressing C/D/I Motor Function - intact, moving foot and toes well on exam.   Past Medical History:  Diagnosis Date   Anemia    hx of    Anginal pain (HCC)    ongoing for last 4-5 years (reported in 08/2014); had normal stress test 08/30/14 [facility] CV)   Cardiospasm    Chronic kidney disease    Headache    History of MRSA infection    2012 per patient on left ear    HTN (hypertension) 12/04/2018   Hypercholesterolemia    Hypoglycemia    Lumbar  herniated disc    Osteoarthritis of spine with radiculopathy, lumbar region 04/01/2015   Pneumonia    hx of pneumonia    Prolapsed uterus     Assessment/Plan: 1 Day Post-Op Procedure(s) (LRB): Revision left total hip arthroplasty to constrained liner (Left) Principal Problem:   S/P revision of left TH  Estimated body mass index is 30.95 kg/m as calculated from the following:   Height as of this encounter: 5\' 5"  (1.651 m).   Weight as of this encounter: 84.4 kg. Advance diet Up with therapy D/C IV fluids  DVT Prophylaxis - Aspirin Weight bearing as tolerated. No posterior precautions, but did discuss with her to avoid sitting cross legged with that leg in flexion and external rotation.   Hgb stable at 11.7 this AM.  Will send home on abx x7 days to promote wound healing.  Plan is to go Home after hospital stay. Plan for discharge today after meeting goals with therapy. Follow up in the office in 2 weeks.   Dennie Bible, PA-C Orthopedic Surgery [phone] 09/24/2022, 8:01 AM

## 2022-09-24 NOTE — TOC Transition Note (Signed)
Transition of Care [facility]) - CM/SW Discharge Note   Patient Details  Name: Kendra Mcgrath MRN: [id number] Date of Birth: 01-27-1951  [facility]) CM/SW Contact:  Amada Jupiter, LCSW Phone Number: 09/24/2022, 10:46 AM   Clinical Narrative:     Met with pt who confirms need for RW and no DME agency preference - order placed with [employer] for delivery to room.  Plan HEP.  No further TOC needs.  Final next level of care: Home/Self Care Barriers to Discharge: No Barriers Identified   Patient Goals and CMS Choice      Discharge Placement                         Discharge Plan and Services Additional resources added to the After Visit Summary for                  DME Arranged: Walker rolling DME Agency: [employer] Date DME Agency Contacted: 09/24/22 Time DME Agency Contacted: [id number] Representative spoke with at DME Agency: Yvonna Alanis            Social Determinants of Health (SDOH) Interventions SDOH Screenings   Food Insecurity: No Food Insecurity (09/23/2022)  Housing: Low Risk  (09/23/2022)  Transportation Needs: No Transportation Needs (09/23/2022)  Utilities: Not At Risk (09/23/2022)  Tobacco Use: Medium Risk (09/24/2022)     Readmission Risk Interventions    09/24/2022   10:45 AM  Readmission Risk Prevention Plan  Post Dischage Appt Complete  Medication Screening Complete  Transportation Screening Complete

## 2022-09-24 NOTE — Progress Notes (Signed)
Provided discharge education/instructions, all questions and concerns addressed. Pt not in any distress, discharged home with all of her belongings.

## 2022-09-24 NOTE — Progress Notes (Signed)
PT Cancellation Note  Patient Details Name: Kendra Mcgrath MRN: [id number] DOB: 1950-08-28   Cancelled Treatment:     PT order received but eval deferred at pt request.  Pt states she is MOD I with all basic mobility tasks and "I've been up walking all night".  PT service to sign off.   Jamiya Nims 09/24/2022, 8:22 AM

## 2022-09-24 NOTE — Plan of Care (Signed)

## 2022-09-27 NOTE — Discharge Summary (Signed)
Patient ID: Kendra Mcgrath MRN: [id number] DOB/AGE: 73-Jun-1952 73 y.o.  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  Discharge Diagnoses:  Principal Problem:   S/P revision of left TH   Past Medical History:  Diagnosis Date   Anemia    hx of    Anginal pain (HCC)    ongoing for last 4-5 years (reported in 08/2014); had normal stress test 08/30/14 [facility])   Cardiospasm    Chronic kidney disease    Headache    History of MRSA infection    2012 per patient on left ear    HTN (hypertension) 12/04/2018   Hypercholesterolemia    Hypoglycemia    Lumbar herniated disc    Osteoarthritis of spine with radiculopathy, 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 female who was admitted 09/23/2022 for operative treatment ofS/P revision of total hip. Patient has severe unremitting pain that affects sleep, daily activities, and work/hobbies. After pre-op clearance the patient was taken to the operating room on 09/23/2022 and underwent  Procedure(s): Revision left total hip arthroplasty to constrained liner.    Patient was given perioperative antibiotics:  Anti-infectives (From admission, onward)    Start     Dose/Rate Route Frequency Ordered Stop   09/24/22 0000  cefadroxil (DURICEF) 500 MG capsule        500 mg Oral 2 times daily 09/24/22 0817 10/01/22 2359   09/23/22 1700  ceFAZolin (ANCEF) IVPB 2g/100 mL premix        2 g 200 mL/hr over 30 Minutes Intravenous Every 6 hours 09/23/22 1331 09/24/22 0706   09/23/22 0830  ceFAZolin (ANCEF) IVPB 2g/100 mL premix        2 g 200 mL/hr over 30 Minutes Intravenous On call to O.R. 09/23/22 0821 09/23/22 1110   09/23/22 0827   ceFAZolin (ANCEF) 2-4 GM/100ML-% IVPB       Note to Pharmacy: [facility]: cabinet override      09/23/22 0827 09/23/22 1103        Patient was given sequential compression devices, early ambulation, and chemoprophylaxis to prevent DVT. Patient worked with PT and was meeting their goals regarding safe ambulation and transfers.  Patient benefited maximally from hospital stay and there were no complications.    Recent vital signs: No data found.   Recent laboratory studies: No results for input(s): "WBC", "HGB", "HCT", "PLT", "NA", "K", "CL", "CO2", "BUN", "CREATININE", "GLUCOSE", "INR", "CALCIUM" in the last 72 hours.  Invalid input(s): "PT", "2"   Discharge Medications:   Allergies as of 09/24/2022       Reactions   Penicillins Hives, Itching, Rash   Tolerated Cephalosporin Date: 09/24/22.   Tetanus Toxoid Hives, Itching, Rash        Medication List     STOP taking these medications    Aleve 220 MG tablet Generic drug: naproxen sodium   HYDROcodone-acetaminophen 5-325 MG tablet Commonly known as: NORCO/VICODIN       TAKE these medications    amLODipine 5 MG tablet Commonly known as: NORVASC Take 5 mg by mouth daily. Notes to patient: Resume home regimen   aspirin 81 MG chewable tablet Chew 1 tablet (81 mg total) by mouth  2 (two) times daily for 28 days.   atorvastatin 40 MG tablet Commonly known as: LIPITOR *NEED OFFICE VISIT* TAKE 1 TABLET BY MOUTH EVERY DAY What changed: See the new instructions.   B-12 Super Strength 5000 MCG/ML Liqd Generic drug: Cyanocobalamin Place 5,000 mcg under the tongue in the morning. Notes to patient: Resume home regimen   benazepril-hydrochlorthiazide 20-25 MG tablet Commonly known as: LOTENSIN HCT TAKE 1 TABLET BY MOUTH EVERY DAY IN THE MORNING What changed: See the new instructions.   cefadroxil 500 MG capsule Commonly known as: DURICEF Take 1 capsule (500 mg total) by mouth 2 (two) times daily for 7 days.    celecoxib 200 MG capsule Commonly known as: CeleBREX Take 1 capsule (200 mg total) by mouth 2 (two) times daily.   diphenhydrAMINE 25 MG tablet Commonly known as: BENADRYL Take 25-75 mg by mouth at bedtime as needed for sleep. Notes to patient: Last dose given 05/30 11:11pm   ezetimibe 10 MG tablet Commonly known as: ZETIA Take 1 tablet (10 mg total) by mouth daily.   levothyroxine 25 MCG tablet Commonly known as: SYNTHROID Take 25 mcg by mouth daily before breakfast.   Magnesium 250 MG Tabs Take 250 mg by mouth in the morning. Notes to patient: Resume home regimen   methocarbamol 500 MG tablet Commonly known as: ROBAXIN Take 1 tablet (500 mg total) by mouth every 6 (six) hours as needed for muscle spasms (muscle pain). Notes to patient: Last dose given 05/30 09:31pm   nitroGLYCERIN 0.4 MG SL tablet Commonly known as: Nitrostat Place 1 tablet (0.4 mg total) under the tongue every 5 (five) minutes as needed for chest pain. Notes to patient: Resume home regimen   NON FORMULARY Take 1 tablet by mouth See admin instructions. Focus Factor tablets- Take 1 tablet by mouth in the morning Notes to patient: Resume home regimen   ondansetron 4 MG tablet Commonly known as: ZOFRAN Take 1 tablet (4 mg total) by mouth every 6 (six) hours as needed for nausea.   Osteo Bi-Flex Adv Triple St Tabs Take 1 tablet by mouth in the morning. Notes to patient: Resume home regimen   oxyCODONE 5 MG immediate release tablet Commonly known as: Oxy IR/ROXICODONE Take 1 tablet (5 mg total) by mouth every 4 (four) hours as needed for severe pain. Notes to patient: Last dose given 05/31 05:13am   polyethylene glycol 17 g packet Commonly known as: MIRALAX / GLYCOLAX Take 17 g by mouth 2 (two) times daily.               Discharge Care Instructions  (From admission, onward)           Start     Ordered   09/24/22 0000  Change dressing       Comments: Maintain surgical dressing until  follow up in the clinic. If the edges start to pull up, may reinforce with tape. If the dressing is no longer working, may remove and cover with gauze and tape, but must keep the area dry and clean.  Call with any questions or concerns.   09/24/22 0817            Diagnostic Studies: DG Pelvis Portable  Result Date: 09/23/2022 CLINICAL DATA:  Status post total hip arthroplasty. EXAM: PORTABLE PELVIS 1-2 VIEWS COMPARISON:  Radiograph 08/15/2022 FINDINGS: Revision left hip arthroplasty in expected alignment. No periprosthetic lucency or fracture. Recent postsurgical change includes air and edema in the soft tissues. IMPRESSION: Revision left hip  arthroplasty without immediate postoperative complication. Electronically Signed   By: Narda Rutherford M.D.   On: 09/23/2022 13:05    Disposition: Discharge disposition: 01-Home or Self Care       Discharge Instructions     Call MD / Call 911   Complete by: As directed    If you experience chest pain or shortness of breath, CALL 911 and be transported to the hospital emergency room.  If you develope a fever above 101 F, pus (white drainage) or increased drainage or redness at the wound, or calf pain, call your surgeon's office.   Change dressing   Complete by: As directed    Maintain surgical dressing until follow up in the clinic. If the edges start to pull up, may reinforce with tape. If the dressing is no longer working, may remove and cover with gauze and tape, but must keep the area dry and clean.  Call with any questions or concerns.   Constipation Prevention   Complete by: As directed    Drink plenty of fluids.  Prune juice may be helpful.  You may use a stool softener, such as Colace (over the counter) 100 mg twice a day.  Use MiraLax (over the counter) for constipation as needed.   Diet - low sodium heart healthy   Complete by: As directed    Increase activity slowly as tolerated   Complete by: As directed    Weight bearing as  tolerated with assist device (walker, cane, etc) as directed, use it as long as suggested by your surgeon or therapist, typically at least 4-6 weeks.   Post-operative opioid taper instructions:   Complete by: As directed    POST-OPERATIVE OPIOID TAPER INSTRUCTIONS: It is important to wean off of your opioid medication as soon as possible. If you do not need pain medication after your surgery it is ok to stop day one. Opioids include: Codeine, Hydrocodone(Norco, Vicodin), Oxycodone(Percocet, oxycontin) and hydromorphone amongst others.  Long term and even short term use of opiods can cause: Increased pain response Dependence Constipation Depression Respiratory depression And more.  Withdrawal symptoms can include Flu like symptoms Nausea, vomiting And more Techniques to manage these symptoms Hydrate well Eat regular healthy meals Stay active Use relaxation techniques(deep breathing, meditating, yoga) Do Not substitute Alcohol to help with tapering If you have been on opioids for less than two weeks and do not have pain than it is ok to stop all together.  Plan to wean off of opioids This plan should start within one week post op of your joint replacement. Maintain the same interval or time between taking each dose and first decrease the dose.  Cut the total daily intake of opioids by one tablet each day Next start to increase the time between doses. The last dose that should be eliminated is the evening dose.      TED hose   Complete by: As directed    Use stockings (TED hose) for 2 weeks on both leg(s).  You may remove them at night for sleeping.        Follow-up Information     Durene Romans, MD. Schedule an appointment as soon as possible for a visit in 2 week(s).   Specialty: Orthopedic Surgery Contact information: [address] [place] [address] [phone]                  Signed: Cassandria Anger 09/27/2022, 7:30 AM

## 2022-11-12 DIAGNOSIS — Z96642 Presence of left artificial hip joint: Secondary | ICD-10-CM | POA: Diagnosis not present

## 2023-07-14 DIAGNOSIS — Z0000 Encounter for general adult medical examination without abnormal findings: Secondary | ICD-10-CM | POA: Diagnosis not present

## 2023-07-14 DIAGNOSIS — E039 Hypothyroidism, unspecified: Secondary | ICD-10-CM | POA: Diagnosis not present

## 2023-07-14 DIAGNOSIS — Z1331 Encounter for screening for depression: Secondary | ICD-10-CM | POA: Diagnosis not present

## 2023-07-14 DIAGNOSIS — J019 Acute sinusitis, unspecified: Secondary | ICD-10-CM | POA: Diagnosis not present

## 2023-07-14 DIAGNOSIS — E7800 Pure hypercholesterolemia, unspecified: Secondary | ICD-10-CM | POA: Diagnosis not present

## 2023-07-14 DIAGNOSIS — Z8659 Personal history of other mental and behavioral disorders: Secondary | ICD-10-CM | POA: Diagnosis not present

## 2023-07-14 DIAGNOSIS — I10 Essential (primary) hypertension: Secondary | ICD-10-CM | POA: Diagnosis not present

## 2023-07-14 DIAGNOSIS — M25552 Pain in left hip: Secondary | ICD-10-CM | POA: Diagnosis not present

## 2023-07-14 DIAGNOSIS — R634 Abnormal weight loss: Secondary | ICD-10-CM | POA: Diagnosis not present

## 2023-07-14 DIAGNOSIS — N1831 Chronic kidney disease, stage 3a: Secondary | ICD-10-CM | POA: Diagnosis not present

## 2023-07-14 DIAGNOSIS — R609 Edema, unspecified: Secondary | ICD-10-CM | POA: Diagnosis not present

## 2023-07-14 DIAGNOSIS — L853 Xerosis cutis: Secondary | ICD-10-CM | POA: Diagnosis not present

## 2023-07-18 DIAGNOSIS — I25119 Atherosclerotic heart disease of native coronary artery with unspecified angina pectoris: Secondary | ICD-10-CM | POA: Diagnosis not present

## 2023-07-18 DIAGNOSIS — I700 Atherosclerosis of aorta: Secondary | ICD-10-CM | POA: Diagnosis not present

## 2023-07-18 DIAGNOSIS — I10 Essential (primary) hypertension: Secondary | ICD-10-CM | POA: Diagnosis not present

## 2023-07-18 DIAGNOSIS — N1832 Chronic kidney disease, stage 3b: Secondary | ICD-10-CM | POA: Diagnosis not present

## 2023-08-16 DIAGNOSIS — I700 Atherosclerosis of aorta: Secondary | ICD-10-CM | POA: Diagnosis not present

## 2023-08-16 DIAGNOSIS — I25119 Atherosclerotic heart disease of native coronary artery with unspecified angina pectoris: Secondary | ICD-10-CM | POA: Diagnosis not present

## 2023-08-16 DIAGNOSIS — N1832 Chronic kidney disease, stage 3b: Secondary | ICD-10-CM | POA: Diagnosis not present

## 2023-08-16 DIAGNOSIS — I10 Essential (primary) hypertension: Secondary | ICD-10-CM | POA: Diagnosis not present

## 2023-08-24 DIAGNOSIS — E039 Hypothyroidism, unspecified: Secondary | ICD-10-CM | POA: Diagnosis not present

## 2023-08-24 DIAGNOSIS — I25119 Atherosclerotic heart disease of native coronary artery with unspecified angina pectoris: Secondary | ICD-10-CM | POA: Diagnosis not present

## 2023-08-24 DIAGNOSIS — I700 Atherosclerosis of aorta: Secondary | ICD-10-CM | POA: Diagnosis not present

## 2023-08-24 DIAGNOSIS — N1832 Chronic kidney disease, stage 3b: Secondary | ICD-10-CM | POA: Diagnosis not present

## 2023-08-24 DIAGNOSIS — I10 Essential (primary) hypertension: Secondary | ICD-10-CM | POA: Diagnosis not present

## 2023-08-24 DIAGNOSIS — E7800 Pure hypercholesterolemia, unspecified: Secondary | ICD-10-CM | POA: Diagnosis not present

## 2023-09-06 DIAGNOSIS — G2581 Restless legs syndrome: Secondary | ICD-10-CM | POA: Diagnosis not present

## 2023-09-06 DIAGNOSIS — F5104 Psychophysiologic insomnia: Secondary | ICD-10-CM | POA: Diagnosis not present

## 2023-09-06 DIAGNOSIS — I499 Cardiac arrhythmia, unspecified: Secondary | ICD-10-CM | POA: Diagnosis not present

## 2023-09-12 ENCOUNTER — Other Ambulatory Visit: Payer: Self-pay | Admitting: *Deleted

## 2023-09-12 ENCOUNTER — Telehealth: Payer: Self-pay | Admitting: *Deleted

## 2023-09-12 DIAGNOSIS — I444 Left anterior fascicular block: Secondary | ICD-10-CM

## 2023-09-12 DIAGNOSIS — I4510 Unspecified right bundle-branch block: Secondary | ICD-10-CM

## 2023-09-12 DIAGNOSIS — R002 Palpitations: Secondary | ICD-10-CM

## 2023-09-12 DIAGNOSIS — F17210 Nicotine dependence, cigarettes, uncomplicated: Secondary | ICD-10-CM

## 2023-09-12 DIAGNOSIS — I499 Cardiac arrhythmia, unspecified: Secondary | ICD-10-CM

## 2023-09-12 DIAGNOSIS — I480 Paroxysmal atrial fibrillation: Secondary | ICD-10-CM

## 2023-09-12 DIAGNOSIS — Z122 Encounter for screening for malignant neoplasm of respiratory organs: Secondary | ICD-10-CM

## 2023-09-12 DIAGNOSIS — Z87891 Personal history of nicotine dependence: Secondary | ICD-10-CM

## 2023-09-12 NOTE — Telephone Encounter (Signed)
 Lung Cancer Screening Narrative/Criteria Questionnaire (Cigarette Smokers Only- No Cigars/Pipes/vapes)   Kendra Mcgrath   SDMV:09/30/23 12:30- [name]                                           1950/06/01              LDCT: 09/30/23 4:20- GI    73 y.o.   Phone: [phone]  Lung Screening Narrative (confirm age 41-77 yrs Medicare / 50-80 yrs Private pay insurance)   Insurance information:[employer] [facility]   Referring Provider:Swayne   This screening involves an initial phone call with a team member from our program. It is called a shared decision making visit. The initial meeting is required by insurance and Medicare to make sure you understand the program. This appointment takes about 15-20 minutes to complete. The CT scan will completed at a separate date/time. This scan takes about 5-10 minutes to complete and you may eat and drink before and after the scan.  Criteria questions for Lung Cancer Screening:   Are you a current or former smoker? Current Age began smoking: 13   If you are a former smoker, what year did you quit smoking? Quit for 2 years (within 15 yrs)   To calculate your smoking history, I need an accurate estimate of how many packs of cigarettes you smoked per day and for how many years. (Not just the number of PPD you are now smoking)   Years smoking 58 x Packs per day 1/2- 3/4 = Pack years 35   (at least 20 pack yrs)   (Make sure they understand that we need to know how much they have smoked in the past, not just the number of PPD they are smoking now)  Do you have a personal history of cancer?  No    Do you have a family history of cancer? Yes  (cancer type and and relative) GM (breast)   Are you coughing up blood?  No  Have you had unexplained weight loss of 15 lbs or more in the last 6 months? Yes  It looks like you meet all criteria.     Additional information: PT reports a 50 lb weight loss in past year - but don't see documentation of this in epic - Pt reports she  doesn't have a big appetite

## 2023-09-15 DIAGNOSIS — N1832 Chronic kidney disease, stage 3b: Secondary | ICD-10-CM | POA: Diagnosis not present

## 2023-09-15 DIAGNOSIS — I25119 Atherosclerotic heart disease of native coronary artery with unspecified angina pectoris: Secondary | ICD-10-CM | POA: Diagnosis not present

## 2023-09-15 DIAGNOSIS — I10 Essential (primary) hypertension: Secondary | ICD-10-CM | POA: Diagnosis not present

## 2023-09-15 DIAGNOSIS — I700 Atherosclerosis of aorta: Secondary | ICD-10-CM | POA: Diagnosis not present

## 2023-09-16 DIAGNOSIS — I499 Cardiac arrhythmia, unspecified: Secondary | ICD-10-CM | POA: Diagnosis not present

## 2023-09-16 DIAGNOSIS — I480 Paroxysmal atrial fibrillation: Secondary | ICD-10-CM | POA: Diagnosis not present

## 2023-09-24 DIAGNOSIS — E039 Hypothyroidism, unspecified: Secondary | ICD-10-CM | POA: Diagnosis not present

## 2023-09-24 DIAGNOSIS — E7800 Pure hypercholesterolemia, unspecified: Secondary | ICD-10-CM | POA: Diagnosis not present

## 2023-09-24 DIAGNOSIS — I10 Essential (primary) hypertension: Secondary | ICD-10-CM | POA: Diagnosis not present

## 2023-09-24 DIAGNOSIS — I700 Atherosclerosis of aorta: Secondary | ICD-10-CM | POA: Diagnosis not present

## 2023-09-24 DIAGNOSIS — N1832 Chronic kidney disease, stage 3b: Secondary | ICD-10-CM | POA: Diagnosis not present

## 2023-09-24 DIAGNOSIS — I25119 Atherosclerotic heart disease of native coronary artery with unspecified angina pectoris: Secondary | ICD-10-CM | POA: Diagnosis not present

## 2023-09-30 ENCOUNTER — Ambulatory Visit: Admitting: *Deleted

## 2023-09-30 ENCOUNTER — Ambulatory Visit
Admission: RE | Admit: 2023-09-30 | Discharge: 2023-09-30 | Disposition: A | Discharge status: Home / Self Care | Source: Ambulatory Visit | Attending: Acute Care | Admitting: Acute Care

## 2023-09-30 DIAGNOSIS — Z122 Encounter for screening for malignant neoplasm of respiratory organs: Secondary | ICD-10-CM

## 2023-09-30 DIAGNOSIS — F17210 Nicotine dependence, cigarettes, uncomplicated: Secondary | ICD-10-CM | POA: Diagnosis not present

## 2023-09-30 DIAGNOSIS — Z87891 Personal history of nicotine dependence: Secondary | ICD-10-CM

## 2023-09-30 NOTE — Patient Instructions (Signed)

## 2023-09-30 NOTE — Progress Notes (Signed)
 Virtual Visit via Telephone Note  I connected with [name] on 09/30/23 at 12:30 PM EDT by telephone and verified that I am speaking with the correct person using two identifiers.  Location: Patient: Kendra Mcgrath Provider: Alyse Bach, RN   I discussed the limitations, risks, security and privacy concerns of performing an evaluation and management service by telephone and the availability of in person appointments. I also discussed with the patient that there may be a patient responsible charge related to this service. The patient expressed understanding and agreed to proceed.  Shared Decision Making Visit Lung Cancer Screening Program [phone])   Eligibility: Age 73 y.o. Pack Years Smoking History Calculation 35 (# packs/per year x # years smoked) Recent History of coughing up blood  no Unexplained weight loss? yes ( >Than 15 pounds within the last 6 months ) Prior History Lung / other cancer recurring pneumonia (Diagnosis within the last 5 years already requiring surveillance chest CT Scans). Smoking Status Current Smoker Former Smokers: Years since quit: n/a  Quit Date: n/a  Visit Components: Discussion included one or more decision making aids. yes Discussion included risk/benefits of screening. yes Discussion included potential follow up diagnostic testing for abnormal scans. yes Discussion included meaning and risk of over diagnosis. yes Discussion included meaning and risk of False Positives. yes Discussion included meaning of total radiation exposure. yes  Counseling Included: Importance of adherence to annual lung cancer LDCT screening. yes Impact of comorbidities on ability to participate in the program. yes Ability and willingness to under diagnostic treatment. yes  Smoking Cessation Counseling: Current Smokers:  Discussed importance of smoking cessation. yes Information about tobacco cessation classes and interventions provided to patient. yes Patient  provided with "ticket" for LDCT Scan. no Symptomatic Patient. no  Counseling(Intermediate counseling: > three minutes) 99406 Diagnosis Code: Tobacco Use Z72.0 Asymptomatic Patient yes  Counseling (Intermediate counseling: > three minutes counseling) U0454 Former Smokers:  Discussed the importance of maintaining cigarette abstinence. yes Diagnosis Code: Personal History of Nicotine Dependence. U98.119 Information about tobacco cessation classes and interventions provided to patient. Yes Patient provided with "ticket" for LDCT Scan. no Written Order for Lung Cancer Screening with LDCT placed in Epic. Yes (CT Chest Lung Cancer Screening Low Dose W/O CM) [id number] Z12.2-Screening of respiratory organs Z87.891-Personal history of nicotine dependence   Alyse Bach, RN

## 2023-10-15 DIAGNOSIS — I10 Essential (primary) hypertension: Secondary | ICD-10-CM | POA: Diagnosis not present

## 2023-10-15 DIAGNOSIS — I25119 Atherosclerotic heart disease of native coronary artery with unspecified angina pectoris: Secondary | ICD-10-CM | POA: Diagnosis not present

## 2023-10-15 DIAGNOSIS — I700 Atherosclerosis of aorta: Secondary | ICD-10-CM | POA: Diagnosis not present

## 2023-10-15 DIAGNOSIS — N1832 Chronic kidney disease, stage 3b: Secondary | ICD-10-CM | POA: Diagnosis not present

## 2023-10-17 ENCOUNTER — Other Ambulatory Visit: Payer: Self-pay | Admitting: Acute Care

## 2023-10-17 DIAGNOSIS — F17210 Nicotine dependence, cigarettes, uncomplicated: Secondary | ICD-10-CM

## 2023-10-17 DIAGNOSIS — Z87891 Personal history of nicotine dependence: Secondary | ICD-10-CM

## 2023-10-17 DIAGNOSIS — Z122 Encounter for screening for malignant neoplasm of respiratory organs: Secondary | ICD-10-CM

## 2023-10-18 ENCOUNTER — Ambulatory Visit: Attending: Family Medicine

## 2023-10-18 DIAGNOSIS — I480 Paroxysmal atrial fibrillation: Secondary | ICD-10-CM | POA: Diagnosis not present

## 2023-10-18 DIAGNOSIS — I444 Left anterior fascicular block: Secondary | ICD-10-CM

## 2023-10-18 DIAGNOSIS — I499 Cardiac arrhythmia, unspecified: Secondary | ICD-10-CM | POA: Diagnosis not present

## 2023-10-18 DIAGNOSIS — R002 Palpitations: Secondary | ICD-10-CM | POA: Diagnosis not present

## 2023-10-18 DIAGNOSIS — I4510 Unspecified right bundle-branch block: Secondary | ICD-10-CM

## 2023-11-07 DIAGNOSIS — I10 Essential (primary) hypertension: Secondary | ICD-10-CM | POA: Diagnosis not present

## 2023-11-07 DIAGNOSIS — F5104 Psychophysiologic insomnia: Secondary | ICD-10-CM | POA: Diagnosis not present

## 2023-11-07 DIAGNOSIS — G2581 Restless legs syndrome: Secondary | ICD-10-CM | POA: Diagnosis not present

## 2023-11-07 DIAGNOSIS — I491 Atrial premature depolarization: Secondary | ICD-10-CM | POA: Diagnosis not present

## 2023-11-08 DIAGNOSIS — Z23 Encounter for immunization: Secondary | ICD-10-CM | POA: Diagnosis not present

## 2023-11-14 DIAGNOSIS — I25119 Atherosclerotic heart disease of native coronary artery with unspecified angina pectoris: Secondary | ICD-10-CM | POA: Diagnosis not present

## 2023-11-14 DIAGNOSIS — I700 Atherosclerosis of aorta: Secondary | ICD-10-CM | POA: Diagnosis not present

## 2023-11-14 DIAGNOSIS — N1832 Chronic kidney disease, stage 3b: Secondary | ICD-10-CM | POA: Diagnosis not present

## 2023-11-14 DIAGNOSIS — I10 Essential (primary) hypertension: Secondary | ICD-10-CM | POA: Diagnosis not present

## 2023-11-24 DIAGNOSIS — I25119 Atherosclerotic heart disease of native coronary artery with unspecified angina pectoris: Secondary | ICD-10-CM | POA: Diagnosis not present

## 2023-11-24 DIAGNOSIS — N1832 Chronic kidney disease, stage 3b: Secondary | ICD-10-CM | POA: Diagnosis not present

## 2023-11-24 DIAGNOSIS — I10 Essential (primary) hypertension: Secondary | ICD-10-CM | POA: Diagnosis not present

## 2023-11-24 DIAGNOSIS — I700 Atherosclerosis of aorta: Secondary | ICD-10-CM | POA: Diagnosis not present

## 2023-12-05 NOTE — Progress Notes (Signed)
 Tobacco counseling provided (Intermediate counseling: 3 1/2 minutes) 9940
